# Patient Record
Sex: Female | Born: 1940 | Race: White | Hispanic: No | Smoking: Never smoker
Health system: Southern US, Community
[De-identification: ages and names within clinical notes are randomized; demographics above are authoritative.]

## PROBLEM LIST (undated history)

## (undated) DIAGNOSIS — E876 Hypokalemia: Secondary | ICD-10-CM

## (undated) DIAGNOSIS — F329 Major depressive disorder, single episode, unspecified: Secondary | ICD-10-CM

## (undated) DIAGNOSIS — M797 Fibromyalgia: Secondary | ICD-10-CM

## (undated) DIAGNOSIS — E162 Hypoglycemia, unspecified: Secondary | ICD-10-CM

## (undated) DIAGNOSIS — K5790 Diverticulosis of intestine, part unspecified, without perforation or abscess without bleeding: Secondary | ICD-10-CM

## (undated) DIAGNOSIS — M1990 Unspecified osteoarthritis, unspecified site: Secondary | ICD-10-CM

## (undated) DIAGNOSIS — R112 Nausea with vomiting, unspecified: Secondary | ICD-10-CM

## (undated) DIAGNOSIS — G459 Transient cerebral ischemic attack, unspecified: Secondary | ICD-10-CM

## (undated) DIAGNOSIS — F419 Anxiety disorder, unspecified: Secondary | ICD-10-CM

## (undated) DIAGNOSIS — I639 Cerebral infarction, unspecified: Secondary | ICD-10-CM

## (undated) DIAGNOSIS — F0150 Vascular dementia without behavioral disturbance: Secondary | ICD-10-CM

## (undated) DIAGNOSIS — F32A Depression, unspecified: Secondary | ICD-10-CM

## (undated) DIAGNOSIS — K3184 Gastroparesis: Secondary | ICD-10-CM

## (undated) DIAGNOSIS — IMO0001 Reserved for inherently not codable concepts without codable children: Secondary | ICD-10-CM

## (undated) DIAGNOSIS — G8929 Other chronic pain: Secondary | ICD-10-CM

## (undated) DIAGNOSIS — K219 Gastro-esophageal reflux disease without esophagitis: Secondary | ICD-10-CM

## (undated) DIAGNOSIS — K5900 Constipation, unspecified: Secondary | ICD-10-CM

## (undated) DIAGNOSIS — I10 Essential (primary) hypertension: Secondary | ICD-10-CM

## (undated) DIAGNOSIS — M549 Dorsalgia, unspecified: Secondary | ICD-10-CM

## (undated) DIAGNOSIS — E559 Vitamin D deficiency, unspecified: Secondary | ICD-10-CM

## (undated) DIAGNOSIS — Z9889 Other specified postprocedural states: Secondary | ICD-10-CM

## (undated) HISTORY — PX: CHOLECYSTECTOMY: SHX55

## (undated) HISTORY — PX: ABDOMINAL HYSTERECTOMY: SHX81

## (undated) HISTORY — PX: ROTATOR CUFF REPAIR: SHX139

## (undated) HISTORY — PX: COLON SURGERY: SHX602

## (undated) HISTORY — PX: NECK SURGERY: SHX720

## (undated) HISTORY — PX: BUNIONECTOMY: SHX129

## (undated) HISTORY — PX: ABDOMINOPLASTY: SUR9

## (undated) HISTORY — PX: TONSILLECTOMY: SUR1361

---

## 2003-01-26 HISTORY — PX: BREAST BIOPSY: SHX20

## 2015-09-25 ENCOUNTER — Encounter: Payer: Self-pay | Admitting: Emergency Medicine

## 2015-09-25 ENCOUNTER — Emergency Department
Admission: EM | Admit: 2015-09-25 | Discharge: 2015-09-26 | Payer: Medicare Other | Attending: Emergency Medicine | Admitting: Emergency Medicine

## 2015-09-25 DIAGNOSIS — F332 Major depressive disorder, recurrent severe without psychotic features: Secondary | ICD-10-CM | POA: Diagnosis not present

## 2015-09-25 DIAGNOSIS — F0150 Vascular dementia without behavioral disturbance: Secondary | ICD-10-CM | POA: Insufficient documentation

## 2015-09-25 DIAGNOSIS — I10 Essential (primary) hypertension: Secondary | ICD-10-CM

## 2015-09-25 DIAGNOSIS — Z8673 Personal history of transient ischemic attack (TIA), and cerebral infarction without residual deficits: Secondary | ICD-10-CM | POA: Diagnosis not present

## 2015-09-25 DIAGNOSIS — M797 Fibromyalgia: Secondary | ICD-10-CM

## 2015-09-25 DIAGNOSIS — R45851 Suicidal ideations: Secondary | ICD-10-CM | POA: Diagnosis not present

## 2015-09-25 DIAGNOSIS — E876 Hypokalemia: Secondary | ICD-10-CM | POA: Insufficient documentation

## 2015-09-25 DIAGNOSIS — F329 Major depressive disorder, single episode, unspecified: Secondary | ICD-10-CM | POA: Diagnosis present

## 2015-09-25 DIAGNOSIS — M1990 Unspecified osteoarthritis, unspecified site: Secondary | ICD-10-CM

## 2015-09-25 DIAGNOSIS — Z5181 Encounter for therapeutic drug level monitoring: Secondary | ICD-10-CM | POA: Insufficient documentation

## 2015-09-25 HISTORY — DX: Depression, unspecified: F32.A

## 2015-09-25 HISTORY — DX: Other chronic pain: G89.29

## 2015-09-25 HISTORY — DX: Major depressive disorder, single episode, unspecified: F32.9

## 2015-09-25 HISTORY — DX: Vascular dementia, unspecified severity, without behavioral disturbance, psychotic disturbance, mood disturbance, and anxiety: F01.50

## 2015-09-25 HISTORY — DX: Gastro-esophageal reflux disease without esophagitis: K21.9

## 2015-09-25 HISTORY — DX: Reserved for inherently not codable concepts without codable children: IMO0001

## 2015-09-25 HISTORY — DX: Diverticulosis of intestine, part unspecified, without perforation or abscess without bleeding: K57.90

## 2015-09-25 HISTORY — DX: Transient cerebral ischemic attack, unspecified: G45.9

## 2015-09-25 HISTORY — DX: Essential (primary) hypertension: I10

## 2015-09-25 HISTORY — DX: Hypoglycemia, unspecified: E16.2

## 2015-09-25 HISTORY — DX: Vitamin D deficiency, unspecified: E55.9

## 2015-09-25 HISTORY — DX: Hypokalemia: E87.6

## 2015-09-25 HISTORY — DX: Constipation, unspecified: K59.00

## 2015-09-25 HISTORY — DX: Unspecified osteoarthritis, unspecified site: M19.90

## 2015-09-25 HISTORY — DX: Gastroparesis: K31.84

## 2015-09-25 HISTORY — DX: Anxiety disorder, unspecified: F41.9

## 2015-09-25 HISTORY — DX: Fibromyalgia: M79.7

## 2015-09-25 HISTORY — DX: Dorsalgia, unspecified: M54.9

## 2015-09-25 LAB — URINE DRUG SCREEN, QUALITATIVE (ARMC ONLY)
AMPHETAMINES, UR SCREEN: NOT DETECTED
Barbiturates, Ur Screen: NOT DETECTED
Benzodiazepine, Ur Scrn: NOT DETECTED
CANNABINOID 50 NG, UR ~~LOC~~: NOT DETECTED
COCAINE METABOLITE, UR ~~LOC~~: NOT DETECTED
MDMA (ECSTASY) UR SCREEN: NOT DETECTED
METHADONE SCREEN, URINE: NOT DETECTED
Opiate, Ur Screen: POSITIVE — AB
Phencyclidine (PCP) Ur S: NOT DETECTED
TRICYCLIC, UR SCREEN: NOT DETECTED

## 2015-09-25 LAB — ETHANOL

## 2015-09-25 LAB — BASIC METABOLIC PANEL
ANION GAP: 9 (ref 5–15)
BUN: 27 mg/dL — AB (ref 6–20)
CHLORIDE: 103 mmol/L (ref 101–111)
CO2: 28 mmol/L (ref 22–32)
Calcium: 9 mg/dL (ref 8.9–10.3)
Creatinine, Ser: 1.24 mg/dL — ABNORMAL HIGH (ref 0.44–1.00)
GFR calc Af Amer: 49 mL/min — ABNORMAL LOW (ref >60)
GFR, EST NON AFRICAN AMERICAN: 42 mL/min — AB (ref >60)
Glucose, Bld: 109 mg/dL — ABNORMAL HIGH (ref 65–99)
POTASSIUM: 3.2 mmol/L — AB (ref 3.5–5.1)
SODIUM: 140 mmol/L (ref 135–145)

## 2015-09-25 LAB — CBC
HCT: 40.1 % (ref 35.0–47.0)
HEMOGLOBIN: 13.5 g/dL (ref 12.0–16.0)
MCH: 31.5 pg (ref 26.0–34.0)
MCHC: 33.5 g/dL (ref 32.0–36.0)
MCV: 93.8 fL (ref 80.0–100.0)
Platelets: 175 10*3/uL (ref 150–440)
RBC: 4.27 MIL/uL (ref 3.80–5.20)
RDW: 15.7 % — ABNORMAL HIGH (ref 11.5–14.5)
WBC: 10.2 10*3/uL (ref 3.6–11.0)

## 2015-09-25 LAB — URINALYSIS COMPLETE WITH MICROSCOPIC (ARMC ONLY)
BILIRUBIN URINE: NEGATIVE
Bacteria, UA: NONE SEEN
GLUCOSE, UA: NEGATIVE mg/dL
KETONES UR: NEGATIVE mg/dL
LEUKOCYTES UA: NEGATIVE
NITRITE: NEGATIVE
PH: 5 (ref 5.0–8.0)
Protein, ur: NEGATIVE mg/dL
SPECIFIC GRAVITY, URINE: 1.011 (ref 1.005–1.030)

## 2015-09-25 MED ORDER — POTASSIUM CHLORIDE CRYS ER 20 MEQ PO TBCR
40.0000 meq | EXTENDED_RELEASE_TABLET | Freq: Once | ORAL | Status: AC
  Administered 2015-09-25: 40 meq via ORAL
  Filled 2015-09-25: qty 2

## 2015-09-25 NOTE — ED Triage Notes (Signed)
Pt c/o depression. Has had problems since dx with vascular depression.  New baby in house is coming and pt recently moved and has become overwhelmed. Denies SI/HI. Just would like help before gets to that point.

## 2015-09-25 NOTE — ED Notes (Signed)
Pt does not need protocol or dress out per dr Don Perkingveronese

## 2015-09-25 NOTE — ED Notes (Signed)
Celso AmyLauna Duncan, daughter, call for ride  [phone]

## 2015-09-25 NOTE — ED Provider Notes (Signed)
[facility] Emergency Department Provider Note  ____________________________________________  Time seen: Approximately 8:20 PM  I have reviewed the triage vital signs and the nursing notes.   HISTORY  Chief Complaint Depression    HPI Maria Horton is a 75 y.o. female long history of anxiety and depression presenting with suicidal ideations. The patient reports that she has recently moved to [facility], and her depression has worsened. She says "I don't be here anymore." She also reports suicidal ideations with a plan but does not want coming what it is. She denies any HI, hallucinations, or acute medical complaints.    Past Medical History:  Diagnosis Date  . Anxiety   . Chronic back pain   . Constipation   . Depression   . Diverticulosis   . Fibromyalgia   . Gastroparesis   . Hypertension   . Hypoglycemia   . Hypokalemia   . Osteoarthritis   . Reflux   . TIA (transient ischemic attack)   . Vascular dementia   . Vitamin D deficiency     There are no active problems to display for this patient.   Past Surgical History:  Procedure Laterality Date  . ABDOMINAL HYSTERECTOMY    . CHOLECYSTECTOMY    . COLON SURGERY    . NECK SURGERY    . ROTATOR CUFF REPAIR    . TONSILLECTOMY        Allergies Ace inhibitors; Beta adrenergic blockers; Calcium channel blockers; and Tape  History reviewed. No pertinent family history.  Social History Social History  Substance Use Topics  . Smoking status: Never Smoker  . Smokeless tobacco: Never Used  . Alcohol use No    Review of Systems Constitutional: No fever/chills. Eyes: No visual changes. ENT: No sore throat. No congestion or rhinorrhea. Cardiovascular: Denies chest pain. Denies palpitations. Respiratory: Denies shortness of breath.  No cough. Gastrointestinal: No abdominal pain.  No nausea, no vomiting.  No diarrhea.  No constipation. Genitourinary: Negative for  dysuria. Musculoskeletal: Negative for back pain. Skin: Negative for rash. Neurological: Negative for headaches. No focal numbness, tingling or weakness.  Psychiatric: Positive suicidal ideations. Negative homicidal ideations. Negative hallucinations.  10-point ROS otherwise negative.  ____________________________________________   PHYSICAL EXAM:  VITAL SIGNS: ED Triage Vitals  Enc Vitals Group     BP 09/25/15 1820 (!) 143/77     Pulse Rate 09/25/15 1820 (!) 50     Resp 09/25/15 1820 18     Temp 09/25/15 1820 98.5 F (36.9 C)     Temp Source 09/25/15 1820 Oral     SpO2 09/25/15 1820 98 %     Weight 09/25/15 1821 142 lb (64.4 kg)     Height 09/25/15 1821 5\' 4"  (1.626 m)     Head Circumference --      Peak Flow --      Pain Score 09/25/15 1821 5     Pain Loc --      Pain Edu? --      Excl. in GC? --     Constitutional: Alert and oriented. Well appearing and in no acute distress. Answers questions appropriately. Eyes: Conjunctivae are normal.  EOMI. No scleral icterus. Head: Atraumatic. Nose: No congestion/rhinnorhea. Mouth/Throat: Mucous membranes are moist.  Neck: No stridor.  Supple.  No meningismus Cardiovascular: Normal rate, regular rhythm. No murmurs, rubs or gallops.  Respiratory: Normal respiratory effort.  No accessory muscle use or retractions. Lungs CTAB.  No wheezes, rales or ronchi. Gastrointestinal: Soft, nontender and  nondistended.  No guarding or rebound.  No peritoneal signs. Musculoskeletal: No LE edema.  Neurologic:  A&Ox3.  Speech is clear.  Face and smile are symmetric.  EOMI.  Moves all extremities well. Skin:  Skin is warm, dry and intact. No rash noted. Psychiatric: Normal mood with depressed affect. Good insight. Speech and behavior are normal. Normal judgment.  ____________________________________________   LABS (all labs ordered are listed, but only abnormal results are displayed)  Labs Reviewed  CBC - Abnormal; Notable for the following:        Result Value   RDW 15.7 (*)    All other components within normal limits  BASIC METABOLIC PANEL - Abnormal; Notable for the following:    Potassium 3.2 (*)    Glucose, Bld 109 (*)    BUN 27 (*)    Creatinine, Ser 1.24 (*)    GFR calc non Af Amer 42 (*)    GFR calc Af Amer 49 (*)    All other components within normal limits  URINE DRUG SCREEN, QUALITATIVE ([facility] ONLY) - Abnormal; Notable for the following:    Opiate, Ur Screen POSITIVE (*)    All other components within normal limits  URINALYSIS COMPLETEWITH MICROSCOPIC ([facility] ONLY) - Abnormal; Notable for the following:    Color, Urine STRAW (*)    APPearance CLEAR (*)    Hgb urine dipstick 1+ (*)    Squamous Epithelial / LPF 0-5 (*)    All other components within normal limits  ETHANOL   ____________________________________________  EKG  Not indicated ____________________________________________  RADIOLOGY  No results found.  ____________________________________________   PROCEDURES  Procedure(s) performed: None  Procedures  Critical Care performed: No ____________________________________________   INITIAL IMPRESSION / ASSESSMENT AND PLAN / ED COURSE  Pertinent labs & imaging results that were available during my care of the patient were reviewed by me and considered in my medical decision making (see chart for details).  75 y.o. female with a long history of anxiety and depression presenting for suicidal ideations with plan. She has no active medical concerns at this time. We will place the patient under involuntary commitment, and activate a consult for TTS and psychiatry. We will get basic labs to complete her full medical assessment, after which she will be cleared for psychiatric disposition.  ----------------------------------------- 11:22 PM on 09/25/2015 -----------------------------------------  The patient is mildly hypokalemic and I have supplemented her for this. At this time, the patient is  medically cleared and awaiting psychiatric disposition. ____________________________________________  FINAL CLINICAL IMPRESSION(S) / ED DIAGNOSES  Final diagnoses:  Suicidal ideation  Hypokalemia    Clinical Course      NEW MEDICATIONS STARTED DURING THIS VISIT:  New Prescriptions   No medications on file      Rockne MenghiniAnne-Caroline Mylan Lengyel, MD 09/25/15 2322

## 2015-09-25 NOTE — ED Notes (Signed)
[facility].  Pt consulting with psych now.  sitter with pt.
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
## 2015-09-25 NOTE — ED Notes (Signed)
Pt watching tv  Sitter with pt 

## 2015-09-26 ENCOUNTER — Inpatient Hospital Stay
Admission: RE | Admit: 2015-09-26 | Discharge: 2015-10-01 | DRG: 885 | Disposition: A | Discharge status: Home / Self Care | Payer: Medicare Other | Source: Intra-hospital | Attending: Psychiatry | Admitting: Psychiatry

## 2015-09-26 DIAGNOSIS — G8929 Other chronic pain: Secondary | ICD-10-CM | POA: Diagnosis present

## 2015-09-26 DIAGNOSIS — Z7982 Long term (current) use of aspirin: Secondary | ICD-10-CM

## 2015-09-26 DIAGNOSIS — F332 Major depressive disorder, recurrent severe without psychotic features: Secondary | ICD-10-CM | POA: Diagnosis present

## 2015-09-26 DIAGNOSIS — M1990 Unspecified osteoarthritis, unspecified site: Secondary | ICD-10-CM

## 2015-09-26 DIAGNOSIS — J449 Chronic obstructive pulmonary disease, unspecified: Secondary | ICD-10-CM | POA: Diagnosis present

## 2015-09-26 DIAGNOSIS — Z639 Problem related to primary support group, unspecified: Secondary | ICD-10-CM

## 2015-09-26 DIAGNOSIS — K219 Gastro-esophageal reflux disease without esophagitis: Secondary | ICD-10-CM | POA: Diagnosis present

## 2015-09-26 DIAGNOSIS — M797 Fibromyalgia: Secondary | ICD-10-CM | POA: Diagnosis present

## 2015-09-26 DIAGNOSIS — Z9889 Other specified postprocedural states: Secondary | ICD-10-CM | POA: Diagnosis not present

## 2015-09-26 DIAGNOSIS — I10 Essential (primary) hypertension: Secondary | ICD-10-CM | POA: Diagnosis present

## 2015-09-26 DIAGNOSIS — Z90710 Acquired absence of both cervix and uterus: Secondary | ICD-10-CM | POA: Diagnosis not present

## 2015-09-26 DIAGNOSIS — F0150 Vascular dementia without behavioral disturbance: Secondary | ICD-10-CM | POA: Diagnosis present

## 2015-09-26 DIAGNOSIS — Z888 Allergy status to other drugs, medicaments and biological substances status: Secondary | ICD-10-CM

## 2015-09-26 DIAGNOSIS — Z9049 Acquired absence of other specified parts of digestive tract: Secondary | ICD-10-CM

## 2015-09-26 DIAGNOSIS — Z79899 Other long term (current) drug therapy: Secondary | ICD-10-CM

## 2015-09-26 DIAGNOSIS — M549 Dorsalgia, unspecified: Secondary | ICD-10-CM | POA: Diagnosis present

## 2015-09-26 DIAGNOSIS — Z8673 Personal history of transient ischemic attack (TIA), and cerebral infarction without residual deficits: Secondary | ICD-10-CM | POA: Diagnosis not present

## 2015-09-26 DIAGNOSIS — Z7951 Long term (current) use of inhaled steroids: Secondary | ICD-10-CM | POA: Diagnosis not present

## 2015-09-26 DIAGNOSIS — G894 Chronic pain syndrome: Secondary | ICD-10-CM | POA: Diagnosis present

## 2015-09-26 DIAGNOSIS — G4700 Insomnia, unspecified: Secondary | ICD-10-CM | POA: Diagnosis present

## 2015-09-26 DIAGNOSIS — R45851 Suicidal ideations: Secondary | ICD-10-CM

## 2015-09-26 DIAGNOSIS — Z5181 Encounter for therapeutic drug level monitoring: Secondary | ICD-10-CM | POA: Diagnosis not present

## 2015-09-26 DIAGNOSIS — R001 Bradycardia, unspecified: Secondary | ICD-10-CM | POA: Diagnosis not present

## 2015-09-26 MED ORDER — CLONAZEPAM 0.5 MG PO TABS
ORAL_TABLET | ORAL | Status: AC
  Administered 2015-09-26: 1 mg
  Filled 2015-09-26: qty 2

## 2015-09-26 MED ORDER — ACETAMINOPHEN 325 MG PO TABS: 650.0000 mg | ORAL_TABLET | Freq: Four times a day (QID) | ORAL | Status: DC | PRN

## 2015-09-26 MED ORDER — TRAZODONE HCL 100 MG PO TABS
ORAL_TABLET | ORAL | Status: AC
  Filled 2015-09-26: qty 1

## 2015-09-26 MED ORDER — CITALOPRAM HYDROBROMIDE 20 MG PO TABS
20.0000 mg | ORAL_TABLET | Freq: Every day | ORAL | Status: DC
  Administered 2015-09-26: 20 mg via ORAL
  Filled 2015-09-26: qty 1

## 2015-09-26 MED ORDER — CLONAZEPAM 0.5 MG PO TABS: 1.0000 mg | ORAL_TABLET | Freq: Every day | ORAL | Status: DC

## 2015-09-26 MED ORDER — CLONAZEPAM 1 MG PO TABS
1.0000 mg | ORAL_TABLET | Freq: Every day | ORAL | Status: DC
  Administered 2015-09-26: 1 mg via ORAL
  Filled 2015-09-26: qty 1

## 2015-09-26 MED ORDER — TRAZODONE HCL 50 MG PO TABS
150.0000 mg | ORAL_TABLET | Freq: Every day | ORAL | Status: DC
  Administered 2015-09-26: 150 mg via ORAL

## 2015-09-26 MED ORDER — TRAZODONE HCL 50 MG PO TABS
ORAL_TABLET | ORAL | Status: AC
  Filled 2015-09-26: qty 1

## 2015-09-26 MED ORDER — MAGNESIUM HYDROXIDE 400 MG/5ML PO SUSP
30.0000 mL | Freq: Every day | ORAL | Status: DC | PRN
  Administered 2015-09-30: 30 mL via ORAL
  Filled 2015-09-26: qty 30

## 2015-09-26 MED ORDER — HYDROCODONE-ACETAMINOPHEN 5-325 MG PO TABS
1.0000 | ORAL_TABLET | Freq: Four times a day (QID) | ORAL | Status: DC | PRN
  Administered 2015-09-27 – 2015-09-30 (×4): 1 via ORAL
  Filled 2015-09-26 (×4): qty 1

## 2015-09-26 MED ORDER — TRAZODONE HCL 50 MG PO TABS
150.0000 mg | ORAL_TABLET | Freq: Every day | ORAL | Status: DC
  Administered 2015-09-26 – 2015-09-30 (×5): 150 mg via ORAL
  Filled 2015-09-26 (×5): qty 1

## 2015-09-26 MED ORDER — ONDANSETRON HCL 4 MG PO TABS: 4.0000 mg | ORAL_TABLET | Freq: Three times a day (TID) | ORAL | Status: DC | PRN

## 2015-09-26 MED ORDER — ALBUTEROL SULFATE (2.5 MG/3ML) 0.083% IN NEBU: 3.0000 mL | INHALATION_SOLUTION | Freq: Four times a day (QID) | RESPIRATORY_TRACT | Status: DC | PRN

## 2015-09-26 MED ORDER — ALUM & MAG HYDROXIDE-SIMETH 200-200-20 MG/5ML PO SUSP: 30.0000 mL | ORAL | Status: DC | PRN

## 2015-09-26 MED ORDER — CITALOPRAM HYDROBROMIDE 20 MG PO TABS: 40.0000 mg | ORAL_TABLET | Freq: Every day | ORAL | Status: DC

## 2015-09-26 MED ORDER — ASPIRIN EC 81 MG PO TBEC
81.0000 mg | DELAYED_RELEASE_TABLET | Freq: Every day | ORAL | Status: DC
  Administered 2015-09-27 – 2015-10-01 (×5): 81 mg via ORAL
  Filled 2015-09-26 (×5): qty 1

## 2015-09-26 MED ORDER — ASPIRIN EC 81 MG PO TBEC
81.0000 mg | DELAYED_RELEASE_TABLET | Freq: Every day | ORAL | Status: DC
  Administered 2015-09-26: 81 mg via ORAL
  Filled 2015-09-26: qty 1

## 2015-09-26 MED ORDER — PANTOPRAZOLE SODIUM 40 MG PO TBEC
40.0000 mg | DELAYED_RELEASE_TABLET | Freq: Every day | ORAL | Status: DC
  Administered 2015-09-27 – 2015-10-01 (×5): 40 mg via ORAL
  Filled 2015-09-26 (×5): qty 1

## 2015-09-26 MED ORDER — ALBUTEROL SULFATE HFA 108 (90 BASE) MCG/ACT IN AERS
1.0000 | INHALATION_SPRAY | Freq: Four times a day (QID) | RESPIRATORY_TRACT | Status: DC | PRN
  Filled 2015-09-26: qty 8

## 2015-09-26 MED ORDER — FLUTICASONE PROPIONATE 50 MCG/ACT NA SUSP
2.0000 | Freq: Every day | NASAL | Status: DC | PRN
  Filled 2015-09-26: qty 16

## 2015-09-26 MED ORDER — TRAZODONE HCL 50 MG PO TABS: 150.0000 mg | ORAL_TABLET | Freq: Every day | ORAL | Status: DC

## 2015-09-26 MED ORDER — CITALOPRAM HYDROBROMIDE 20 MG PO TABS
40.0000 mg | ORAL_TABLET | Freq: Every day | ORAL | Status: DC
  Administered 2015-09-27 – 2015-10-01 (×5): 40 mg via ORAL
  Filled 2015-09-26 (×5): qty 2

## 2015-09-26 MED ORDER — HYDROCODONE-ACETAMINOPHEN 5-325 MG PO TABS
1.0000 | ORAL_TABLET | Freq: Four times a day (QID) | ORAL | Status: DC | PRN
  Administered 2015-09-26: 1 via ORAL
  Filled 2015-09-26: qty 1

## 2015-09-26 MED ORDER — PANTOPRAZOLE SODIUM 40 MG PO TBEC
40.0000 mg | DELAYED_RELEASE_TABLET | Freq: Every day | ORAL | Status: DC
  Administered 2015-09-26: 40 mg via ORAL
  Filled 2015-09-26: qty 1

## 2015-09-26 NOTE — ED Notes (Signed)
BEHAVIORAL HEALTH ROUNDING Patient sleeping: Yes.   Patient alert and oriented: eyes closed  Appears to be asleep Behavior appropriate: Yes.  ; If no, describe:  Nutrition and fluids offered: Yes  Toileting and hygiene offered: sleeping Sitter present: q 15 minute observations and security monitoring Law enforcement present: yes  ODS 

## 2015-09-26 NOTE — ED Notes (Signed)

## 2015-09-26 NOTE — ED Notes (Signed)
[facility] after change of shift

## 2015-09-26 NOTE — Tx Team (Signed)
Initial Treatment Plan 09/26/2015 11:57 PM Maria Horton [id number]    PATIENT STRESSORS: Marital or family conflict Medication change or noncompliance   PATIENT STRENGTHS: Ability for insight Capable of independent living Communication skills Motivation for treatment/growth   PATIENT IDENTIFIED PROBLEMS:   Depression  SI      "Medications to alleviate depression"           DISCHARGE CRITERIA:  Improved stabilization in mood, thinking, and/or behavior  PRELIMINARY DISCHARGE PLAN: Outpatient therapy  PATIENT/FAMILY INVOLVEMENT: This treatment plan has been presented to and reviewed with the patient, Maria Horton, and/or family member, .  The patient and family have been given the opportunity to ask questions and make suggestions.  Ernesto RutherfordKristen Saraann Enneking, RN 09/26/2015, 11:57 PM

## 2015-09-26 NOTE — ED Notes (Signed)
BEHAVIORAL HEALTH ROUNDING Patient sleeping: No. Patient alert and oriented: yes Behavior appropriate: Yes.  ; If no, describe:  Nutrition and fluids offered: yes Toileting and hygiene offered: Yes  Sitter present: q15 minute observations and security  monitoring Law enforcement present: Yes  ODS  

## 2015-09-26 NOTE — ED Notes (Signed)
meds given.  Pt argumenative with rn.  Pt wanting her trazadone as well as klonopin.   md aware.  Sitter with pt.

## 2015-09-26 NOTE — ED Notes (Signed)
Lunch was given to patient 

## 2015-09-26 NOTE — ED Notes (Signed)

## 2015-09-26 NOTE — ED Notes (Signed)
Patient observed lying in bed with eyes closed  Even, unlabored respirations observed   NAD pt appears to be sleeping  I will continue to monitor along with every 15 minute visual observations and ongoing security monitoring   Environment secured

## 2015-09-26 NOTE — Progress Notes (Signed)
[facility] by Dr. Toni Amendlapacs.  Attending Physician will be Dr. Jennet MaduroPucilowska.   Patient has been assigned to room 301, [facility] Charge Nurse Marylu LundJanet.   Intake Paper Work has been signed and placed on patient chart.  ER staff is aware of the admission [facility]( [facility].; Dr. ER MD; Dorinda Hillonald Patient's Nurse & Denies Patient Access).    Please call Report after 7pm    09/26/2015 Cheryl FlashNicole Rucha Wissinger, MS, NCC, [facility] Therapeutic Triage Specialist

## 2015-09-26 NOTE — ED Notes (Signed)
Patient refused breakfast at this time, but left in room at patient's request.

## 2015-09-26 NOTE — ED Notes (Signed)
BEHAVIORAL HEALTH ROUNDING Patient sleeping: No. Patient alert and oriented: yes Behavior appropriate: Yes.  ; If no, describe:  Nutrition and fluids offered: yes Toileting and hygiene offered: Yes  Sitter present: q15 minute observations and security monitoring Law enforcement present: Yes  ODS  Pt observed with no unusual behavior  Appropriate to stimulation  No verbalized needs or concerns at this time  NAD assessed  Continue to monitor  

## 2015-09-26 NOTE — ED Notes (Signed)
ED BHU PLACEMENT JUSTIFICATION Is the patient under IVC or is there intent for IVC: Yes.   Is the patient medically cleared: Yes.   Is there vacancy in the ED BHU: Yes.   Is the population mix appropriate for patient: Yes.   Is the patient awaiting placement in inpatient or outpatient setting:  Consult pending Has the patient had a psychiatric consult:   Consult pending  Survey of unit performed for contraband, proper placement and condition of furniture, tampering with fixtures in bathroom, shower, and each patient room: Yes.  ; Findings:  APPEARANCE/BEHAVIOR Calm and cooperative NEURO ASSESSMENT Orientation: oriented x3  Denies pain Hallucinations: No.None noted (Hallucinations) Speech: Normal Gait: normal RESPIRATORY ASSESSMENT Even  Unlabored respirations  CARDIOVASCULAR ASSESSMENT Pulses equal   regular rate  Skin warm and dry   GASTROINTESTINAL ASSESSMENT no GI complaint EXTREMITIES Full ROM  PLAN OF CARE Provide calm/safe environment. Vital signs assessed twice daily. ED BHU Assessment once each 12-hour shift. Collaborate with intake TTS daily or as condition indicates. Assure the ED provider has rounded once each shift. Provide and encourage hygiene. Provide redirection as needed. Assess for escalating behavior; address immediately and inform ED provider.  Assess family dynamic and appropriateness for visitation as needed: Yes.  ; If necessary, describe findings:  Educate the patient/family about BHU procedures/visitation: Yes.  ; If necessary, describe findings:

## 2015-09-26 NOTE — ED Notes (Signed)
BEHAVIORAL HEALTH ROUNDING Patient sleeping: Yes.   Patient alert and oriented: eyes closed  Appears asleep Behavior appropriate: Yes.  ; If no, describe:  Nutrition and fluids offered: Yes  Toileting and hygiene offered: sleeping Sitter present: q 15 minute observations and security monitoring Law enforcement present: yes  ODS 

## 2015-09-26 NOTE — ED Notes (Signed)
Patient refused shower. 

## 2015-09-26 NOTE — ED Notes (Signed)
She has spoke with her daughter Arnoldo MoraleLauna  Pt alert and ambulating in her room  NAD assessed

## 2015-09-26 NOTE — ED Notes (Signed)
BEHAVIORAL HEALTH ROUNDING Patient sleeping: Yes.   Patient alert and oriented: eyes closed  Appears to be asleep Behavior appropriate: Yes.  ; If no, describe:  Nutrition and fluids offered: Yes  Toileting and hygiene offered:  Yes  Sitter present: q 15 minute observations and security monitoring Law enforcement present: yes  ODS

## 2015-09-26 NOTE — ED Notes (Signed)
Pt states depressed. Wanting to sleep trazodone she takes at 10pm didn't get til 0131. Pt states restless. Pt denies any pain. Pt  Denies SI and HI. Pt ambulates with steady gait. Pt reports uses a crane at times. No distress noted. Pt has 1:1 sitter at the bedside. Pt is continent walks to bathroom.

## 2015-09-26 NOTE — ED Notes (Signed)

## 2015-09-26 NOTE — ED Notes (Signed)
Breakfast provided at bedside  Pt continues to sleep quietly  NAD assessed

## 2015-09-26 NOTE — ED Notes (Signed)
Am meds administered as ordered  Pt reports  "I have slept all morning because they did not give me my bedtime medicine until 0300 this am.  I hurt in my joints - especially my shoulders and back."  Assessment completed

## 2015-09-26 NOTE — ED Notes (Signed)
Pt watching tv.  Sitter with pt.  Pt calm and cooperative.

## 2015-09-26 NOTE — Consult Note (Signed)
[facility] Consult   Reason for Consult:  Consult for 75 year old woman who presented to the emergency room with complaints of severe depression Referring Physician:  Quentin Cornwall Patient Identification: Maria Horton MRN:  [id number] Principal Diagnosis: Severe recurrent major depression without psychotic features [facility]) Diagnosis:   Patient Active Problem List   Diagnosis Date Noted  . Severe recurrent major depression without psychotic features ([place]) [F33.2] 09/26/2015  . Suicidal ideation [R45.851] 09/26/2015  . Fibromyalgia [M79.7] 09/26/2015  . Osteoarthritis [M19.90] 09/26/2015  . Hypertension [I10] 09/26/2015    Total Time spent with patient: 1 hour  Subjective:   Maria Horton is a 75 y.o. female patient admitted with "I've just been feeling terrible".  HPI:  Patient interviewed. Chart reviewed. Labs and vitals reviewed. Case discussed with TTS and emergency room physician. 75 year old woman brought to the emergency room by her daughter because of depression. Patient states that her mood has been depressed for years but has been getting progressively worse over the last year that she has been living here in [place]. She feels sad almost all the time. The last 2 days she has been crying almost constantly. Feels hopeless about her life situation. Admits that she has had suicidal thoughts. Was having thoughts about shooting herself until her daughter took her gun away from her. Has had thoughts about wanting to ensure only. Patient says that she is compliant with her prescribed antidepressant medicine. Her sleep is adequate only in the context of taking her sleeping medicine. Appetite is poor and she has lost about 20 pounds. Patient feels lonely and isolated. She does not report any auditory or visual hallucinations. She is not drinking alcohol or abusing any drugs. She is currently getting her medicine prescribed by a primary care doctor and is not seeing a mental  health professional. Major stress includes multiple issues revolving around her to adult children. Also she feels she has never adjusted to relocating to [place] from her home in [place] a year ago.  Social history: Patient moved here from [place] a little over a year ago. She was living with her adult daughter. Recently moved out into a home of her own. Feels lonely and isolated. Feels like her daughter and son and their families are not very considerate of her.  Medical history: Multiple medical problems including hypertension, gastric reflux disease, history of fibromyalgia and osteoarthritis  Substance abuse history: Denies any use of alcohol or drugs currently or any past use of substances to abuse.  Past Psychiatric History: Patient has a long history of depression. Has had 2 prior psychiatric hospitalizations both in [place] a few years ago. She has had recurrent suicidal ideation but has not actually attempted to kill her self. Patient is currently taking citalopram 20 mg a day and trazodone 100 mg at night. She thinks she has been on other antidepressants in the past but cannot remember what they were. She thinks that medicines had been effective at one point. Denies any history of mania and denies any history of psychotic symptoms. She was seeing a therapist in [place] for a while but currently is not seeing any mental health provider regularly.  Risk to Self: Suicidal Ideation: Yes-Currently Present (sts "I don't want to be here anymore.") Suicidal Intent: Yes-Currently Present Is patient at risk for suicide?: Yes Suicidal Plan?: Yes-Currently Present Specify Current Suicidal Plan:  (sts plan to wreck her car or shoot herself ) Access to Means: No (pt sts that daughter has her gun  for safety) What has been your use of drugs/alcohol within the last 12 months?:  (none) How many times?:  (0) Other Self Harm Risks:  (none noted) Triggers for Past Attempts: None known Intentional Self  Injurious Behavior: None Risk to Others: Homicidal Ideation: No Thoughts of Harm to Others: No (pt sts she has anger outbursts with verbal aggression) Current Homicidal Intent: No Current Homicidal Plan: No Access to Homicidal Means: No Identified Victim:  (none noted) History of harm to others?: No (denies) Assessment of Violence: None Noted Violent Behavior Description:  (verbal aggression only when angry or stressed) Does patient have access to weapons?: No Criminal Charges Pending?: No Does patient have a court date: No Prior Inpatient Therapy: Prior Inpatient Therapy: Yes Prior Therapy Dates:  (2014; 2007) Prior Therapy Facilty/Provider(s):  (facilities in [place]) Reason for Treatment:  (Depression, SI) Prior Outpatient Therapy: Prior Outpatient Therapy: Yes Prior Therapy Dates:  ("many years") Prior Therapy Facilty/Provider(s):  (various, mostly in [place]) Reason for Treatment:  (Depression, Anxiety) Does patient have an ACCT team?: No Does patient have Intensive In-House Services?  : No Does patient have [facility] services? : No Does patient have P4CC services?: Unknown  Past Medical History:  Past Medical History:  Diagnosis Date  . Anxiety   . Chronic back pain   . Constipation   . Depression   . Diverticulosis   . Fibromyalgia   . Gastroparesis   . Hypertension   . Hypoglycemia   . Hypokalemia   . Osteoarthritis   . Reflux   . TIA (transient ischemic attack)   . Vascular dementia   . Vitamin D deficiency     Past Surgical History:  Procedure Laterality Date  . ABDOMINAL HYSTERECTOMY    . CHOLECYSTECTOMY    . COLON SURGERY    . NECK SURGERY    . ROTATOR CUFF REPAIR    . TONSILLECTOMY     Family History: History reviewed. No pertinent family history. Family Psychiatric  History: Patient denies being aware of any family history of mental illness Social History:  History  Alcohol Use No     History  Drug Use No    Social History   Social History  .  Marital status: Widowed    Spouse name: N/A  . Number of children: N/A  . Years of education: N/A   Social History Main Topics  . Smoking status: Never Smoker  . Smokeless tobacco: Never Used  . Alcohol use No  . Drug use: No  . Sexual activity: Not Asked   Other Topics Concern  . None   Social History Narrative  . None   Additional Social History:    Allergies:   Allergies  Allergen Reactions  . Ace Inhibitors Other (See Comments)    achy  . Beta Adrenergic Blockers Other (See Comments)    Makes achy/feel sick  . Calcium Channel Blockers Other (See Comments)    Feels achy like sick  . Tape Rash    Labs:  Results for orders placed or performed during the hospital encounter of 09/25/15 (from the past 48 hour(s))  CBC     Status: Abnormal   Collection Time: 09/25/15  8:29 PM  Result Value Ref Range   WBC 10.2 3.6 - 11.0 K/uL   RBC 4.27 3.80 - 5.20 MIL/uL   Hemoglobin 13.5 12.0 - 16.0 g/dL   HCT 40.1 35.0 - 47.0 %   MCV 93.8 80.0 - 100.0 fL   MCH 31.5 26.0 - 34.0  pg   MCHC 33.5 32.0 - 36.0 g/dL   RDW 15.7 (H) 11.5 - 14.5 %   Platelets 175 150 - 440 K/uL  Basic metabolic panel     Status: Abnormal   Collection Time: 09/25/15  8:29 PM  Result Value Ref Range   Sodium 140 135 - 145 mmol/L   Potassium 3.2 (L) 3.5 - 5.1 mmol/L   Chloride 103 101 - 111 mmol/L   CO2 28 22 - 32 mmol/L   Glucose, Bld 109 (H) 65 - 99 mg/dL   BUN 27 (H) 6 - 20 mg/dL   Creatinine, Ser 1.24 (H) 0.44 - 1.00 mg/dL   Calcium 9.0 8.9 - 10.3 mg/dL   GFR calc non Af Amer 42 (L) >60 mL/min   GFR calc Af Amer 49 (L) >60 mL/min    Comment: (NOTE) The eGFR has been calculated using the CKD EPI equation. This calculation has not been validated in all clinical situations. eGFR's persistently <60 mL/min signify possible Chronic Kidney Disease.    Anion gap 9 5 - 15  Ethanol     Status: None   Collection Time: 09/25/15  8:29 PM  Result Value Ref Range   Alcohol, Ethyl (B) <5 <5 mg/dL     Comment:        LOWEST DETECTABLE LIMIT FOR SERUM ALCOHOL IS 5 mg/dL FOR MEDICAL PURPOSES ONLY   Urine Drug Screen, Qualitative ([facility] only)     Status: Abnormal   Collection Time: 09/25/15  8:30 PM  Result Value Ref Range   Tricyclic, Ur Screen NONE DETECTED NONE DETECTED   Amphetamines, Ur Screen NONE DETECTED NONE DETECTED   MDMA (Ecstasy)Ur Screen NONE DETECTED NONE DETECTED   Cocaine Metabolite,Ur [place] NONE DETECTED NONE DETECTED   Opiate, Ur Screen POSITIVE (A) NONE DETECTED   Phencyclidine (PCP) Ur S NONE DETECTED NONE DETECTED   Cannabinoid 50 Ng, Ur [place] NONE DETECTED NONE DETECTED   Barbiturates, Ur Screen NONE DETECTED NONE DETECTED   Benzodiazepine, Ur Scrn NONE DETECTED NONE DETECTED   Methadone Scn, Ur NONE DETECTED NONE DETECTED    Comment: (NOTE) 604  Tricyclics, urine               Cutoff 1000 ng/mL 200  Amphetamines, urine             Cutoff 1000 ng/mL 300  MDMA (Ecstasy), urine           Cutoff 500 ng/mL 400  Cocaine Metabolite, urine       Cutoff 300 ng/mL 500  Opiate, urine                   Cutoff 300 ng/mL 600  Phencyclidine (PCP), urine      Cutoff 25 ng/mL 700  Cannabinoid, urine              Cutoff 50 ng/mL 800  Barbiturates, urine             Cutoff 200 ng/mL 900  Benzodiazepine, urine           Cutoff 200 ng/mL 1000 Methadone, urine                Cutoff 300 ng/mL 1100 1200 The urine drug screen provides only a preliminary, unconfirmed 1300 analytical test result and should not be used for non-medical 1400 purposes. Clinical consideration and professional judgment should 1500 be applied to any positive drug screen result due to possible 1600 interfering substances. A more specific alternate chemical method  1700 must be used in order to obtain a confirmed analytical result.  1800 Gas chromato graphy / mass spectrometry (GC/MS) is the preferred 1900 confirmatory method.   Urinalysis complete, with microscopic ([facility] only)     Status: Abnormal   Collection  Time: 09/25/15  8:30 PM  Result Value Ref Range   Color, Urine STRAW (A) YELLOW   APPearance CLEAR (A) CLEAR   Glucose, UA NEGATIVE NEGATIVE mg/dL   Bilirubin Urine NEGATIVE NEGATIVE   Ketones, ur NEGATIVE NEGATIVE mg/dL   Specific Gravity, Urine 1.011 1.005 - 1.030   Hgb urine dipstick 1+ (A) NEGATIVE   pH 5.0 5.0 - 8.0   Protein, ur NEGATIVE NEGATIVE mg/dL   Nitrite NEGATIVE NEGATIVE   Leukocytes, UA NEGATIVE NEGATIVE   RBC / HPF 0-5 0 - 5 RBC/hpf   WBC, UA 0-5 0 - 5 WBC/hpf   Bacteria, UA NONE SEEN NONE SEEN   Squamous Epithelial / LPF 0-5 (A) NONE SEEN   Mucous PRESENT    Hyaline Casts, UA PRESENT     Current Facility-Administered Medications  Medication Dose Route Frequency Provider Last Rate Last Dose  . albuterol (PROVENTIL) (2.5 MG/3ML) 0.083% nebulizer solution 3 mL  3 mL Inhalation Q6H PRN Gregor Hams, MD      . aspirin EC tablet 81 mg  81 mg Oral Daily Gregor Hams, MD   81 mg at 09/26/15 1145  . [START ON 09/27/2015] citalopram (CELEXA) tablet 40 mg  40 mg Oral Daily Gonzella Lex, MD      . clonazePAM (KLONOPIN) tablet 1 mg  1 mg Oral QHS Gregor Hams, MD      . fluticasone [facility]) 50 MCG/ACT nasal spray 2 spray  2 spray Each Nare Daily PRN Gregor Hams, MD      . HYDROcodone-acetaminophen (NORCO/VICODIN) 5-325 MG per tablet 1 tablet  1 tablet Oral Q6H PRN Gregor Hams, MD   1 tablet at 09/26/15 1151  . ondansetron (ZOFRAN) tablet 4 mg  4 mg Oral Q8H PRN Gregor Hams, MD      . pantoprazole (PROTONIX) EC tablet 40 mg  40 mg Oral Daily Gregor Hams, MD   40 mg at 09/26/15 1145  . traZODone (DESYREL) tablet 150 mg  150 mg Oral QHS Gregor Hams, MD   150 mg at 09/26/15 0131  . traZODone (DESYREL) tablet 150 mg  150 mg Oral QHS Gregor Hams, MD       Current Outpatient Prescriptions  Medication Sig Dispense Refill  . albuterol (PROVENTIL HFA;VENTOLIN HFA) 108 (90 Base) MCG/ACT inhaler Inhale 2 puffs into the lungs every 6 (six) hours  as needed for wheezing or shortness of breath.    Marland Kitchen aspirin EC 81 MG tablet Take 81 mg by mouth daily.    . citalopram (CELEXA) 20 MG tablet Take 20 mg by mouth daily.    . clonazePAM (KLONOPIN) 1 MG tablet Take 1 mg by mouth at bedtime.    Marland Kitchen esomeprazole (NEXIUM) 40 MG capsule Take 40 mg by mouth daily at 12 noon.    . fluticasone (FLONASE) 50 MCG/ACT nasal spray Place 2 sprays into both nostrils daily as needed for allergies or rhinitis.    Marland Kitchen HYDROcodone-acetaminophen (NORCO/VICODIN) 5-325 MG tablet Take 1 tablet by mouth every 6 (six) hours as needed for moderate pain.    Marland Kitchen ondansetron (ZOFRAN) 4 MG tablet Take 4 mg by mouth every 8 (eight) hours as needed for nausea or vomiting.    Marland Kitchen  traZODone (DESYREL) 150 MG tablet Take by mouth at bedtime.      Musculoskeletal: Strength & Muscle Tone: within normal limits Gait & Station: normal Patient leans: N/A  Psychiatric Specialty Exam: Physical Exam  Nursing note and vitals reviewed. Constitutional: She appears well-developed and well-nourished.  HENT:  Head: Normocephalic and atraumatic.  Eyes: Conjunctivae are normal. Pupils are equal, round, and reactive to light.  Neck: Normal range of motion.  Cardiovascular: Regular rhythm and normal heart sounds.   Respiratory: Effort normal. No respiratory distress.  GI: Soft.  Musculoskeletal: Normal range of motion.  Neurological: She is alert.  Skin: Skin is warm and dry.  Psychiatric: Judgment normal. Her mood appears anxious. Her affect is blunt. Her speech is delayed. She is slowed. Cognition and memory are normal. She exhibits a depressed mood. She expresses suicidal ideation. She expresses suicidal plans.    Review of Systems  Constitutional: Negative.   HENT: Negative.   Eyes: Negative.   Respiratory: Negative.   Cardiovascular: Negative.   Gastrointestinal: Negative.   Musculoskeletal: Positive for back pain, joint pain and neck pain.  Skin: Negative.   Neurological: Negative.    Psychiatric/Behavioral: Positive for depression and suicidal ideas. Negative for hallucinations, memory loss and substance abuse. The patient is nervous/anxious and has insomnia.     Blood pressure (!) 105/55, pulse 62, temperature 98 F (36.7 C), temperature source Oral, resp. rate 16, height '5\' 4"'  (1.626 m), weight 64.4 kg (142 lb), SpO2 97 %.Body mass index is 24.37 kg/m.  General Appearance: Casual  Eye Contact:  Fair  Speech:  Slow  Volume:  Decreased  Mood:  Depressed  Affect:  Depressed  Thought Process:  Goal Directed  Orientation:  Full (Time, Place, and Person)  Thought Content:  Logical  Suicidal Thoughts:  Yes.  with intent/plan  Homicidal Thoughts:  No  Memory:  Immediate;   Good Recent;   Fair Remote;   Fair  Judgement:  Fair  Insight:  Fair  Psychomotor Activity:  Decreased  Concentration:  Concentration: Fair  Recall:  [employer] of Knowledge:  Fair  Language:  Fair  Akathisia:  No  Handed:  Right  AIMS (if indicated):     Assets:  Communication Skills Desire for Improvement Financial Resources/Insurance Housing Leisure Time Resilience  ADL's:  Intact  Cognition:  WNL  Sleep:        Treatment Plan Summary: Daily contact with patient to assess and evaluate symptoms and progress in treatment, Medication management and Plan This is a 75 year old woman with a history of recurrent severe depression who is currently presenting with multiple symptoms of depression including active suicidal ideation. No evidence of obvious psychotic symptoms. Patient is cooperative and calm. She has chronic pain issues and high blood pressure. Labs unremarkable. No substance abuse. Patient requires inpatient hospital level treatment for depression. Patient reports that she is normally completely ambulatory around the house. She is alert and oriented and able to remember 2 out of 3 objects at 3 minutes and engage in a lucid and meaningful conversation including insight into her own  problems. Patient to my assessment would be appropriate for management on the adult psychiatric unit here. I am going to go ahead and put in orders. I've increase the dose of her citalopram to 40 mg a day. Case reviewed with TTS.  Disposition: Recommend psychiatric Inpatient admission when medically cleared. Supportive therapy provided about ongoing stressors.  Alethia Berthold, MD 09/26/2015 4:30 PM

## 2015-09-27 LAB — LIPID PANEL
CHOLESTEROL: 275 mg/dL — AB (ref 0–200)
HDL: 48 mg/dL (ref >40)
LDL Cholesterol: 176 mg/dL — ABNORMAL HIGH (ref 0–99)
TRIGLYCERIDES: 255 mg/dL — AB (ref <150)
Total CHOL/HDL Ratio: 5.7 RATIO
VLDL: 51 mg/dL — ABNORMAL HIGH (ref 0–40)

## 2015-09-27 LAB — TSH: TSH: 1.428 u[IU]/mL (ref 0.350–4.500)

## 2015-09-27 LAB — HEMOGLOBIN A1C: HEMOGLOBIN A1C: 5.6 % (ref 4.0–6.0)

## 2015-09-27 MED ORDER — CLONAZEPAM 0.5 MG PO TABS
0.7500 mg | ORAL_TABLET | Freq: Every day | ORAL | Status: DC
  Administered 2015-09-27 – 2015-09-30 (×4): 0.75 mg via ORAL
  Filled 2015-09-27 (×4): qty 2

## 2015-09-27 MED ORDER — HYDROCHLOROTHIAZIDE 12.5 MG PO CAPS
12.5000 mg | ORAL_CAPSULE | Freq: Every day | ORAL | Status: DC
  Administered 2015-09-28 – 2015-10-01 (×4): 12.5 mg via ORAL
  Filled 2015-09-27 (×5): qty 1

## 2015-09-27 NOTE — H&P (Addendum)
Psychiatric Admission Assessment Adult  Patient Identification: Maria Horton MRN:  [id number] Date of Evaluation:  09/27/2015 Chief Complaint:  Major Depression Disorder  Principal Diagnosis: Major Depression Diagnosis:   Patient Active Problem List   Diagnosis Date Noted  . Severe recurrent major depression without psychotic features (HCC) [F33.2] 09/26/2015  . Suicidal ideation [R45.851] 09/26/2015  . Fibromyalgia [M79.7] 09/26/2015  . Osteoarthritis [M19.90] 09/26/2015  . Hypertension [I10] 09/26/2015   History of Present Illness:  Ms. Maria Horton is a 75 year old widowed Caucasian female with a history of recurrent major depression was brought to the emergency room by her daughter who lives in [place]. The patient says her depressive symptoms have progressively gotten worse over the past one year. She moved to [place] from [place] approximately 1 year ago and has lived in [place] for several years. She says she does not have a lot of friends here and is very lonely. She initially moved to be closer to her daughter but she and her daughter have been having some conflict. She also reports conflict with her son. Her son lives in [place] and his girlfriend is now living with the patient as she is expecting a baby in 2 weeks. The patient feels like her son has abandoned the girlfriend and the child. She has a very good relationship with her son's girlfriend who lives with her but feels that her biological daughter is jealous of her relationship with his son's girlfriend. She says her daughter is also jealous of her son. The patient had suicidal thoughts with a plan to shoot herself. She does not have a gun in the house and the gun was left at her daughter's house in [place] over 1 year ago. She does report anhedonia, feelings of hopelessness, low energy and frequent crying spells. She has had problems with insomnia but takes trazodone and Klonopin to help with sleep. She has had a 20 pound weight loss  and decreased appetite over the past one year. She denies any symptoms consistent with bipolar mania including grandiose delusions, decreased sleep with increased goal-directed behavior, hyperreligious thoughts, hypersexual behavior. She denies any history of any psychotic symptoms including auditory or visual hallucinations. No paranoid thoughts or delusions. She denies any history of any heavy alcohol use or illicit drug use. The patient was seeing a psychiatrist and a therapist at [facility] in [place]. She likes her therapist there but does not have a good rapport with her psychiatrist.   Past psychiatric history: The patient says she first started taking antidepressants in her 26s and has tried multiple antidepressants in the past but cannot remember their name. She did the best on Celexa and trazodone. He has a history of 2 prior inpatient psychiatric hospitalization with the last one being approximately 3 years ago. She has had recurrent suicidal ideation but no suicide attempts. The patient was seeing a psychiatrist at [facility] in [place] and was also seeing a therapist there over the past one year. She reports being compliant with medications prior to admission.  Substance abuse history: The patient denies any history of any heavy alcohol use or illicit drug use  Family psychiatric history The patient reports that her son uses drugs and her daughter struggles with depression  Social history The patient previously lived in [place] for many years but moved from [place] to [place] approximately one year ago. She has been married 4 times total and has been widowed since 2000. She has one daughter who lives in [place]  and 1 son who lives out of state. [place] her son's friend who is expecting a child in 2 weeks. The patient has a good relationship with her son's girlfriend but says she has a strained relationship with both her daughter and  her son.   Associated Signs/Symptoms: Depression Symptoms:  depressed mood, anhedonia, insomnia, fatigue, recurrent thoughts of death, anxiety, (Hypo) Manic Symptoms:  None Anxiety Symptoms:  Excessive Worry, Psychotic Symptoms:  None PTSD Symptoms: None Total Time spent with patient: 1 hour    Is the patient at risk to self? Yes.    Has the patient been a risk to self in the past 6 months? Yes.    Has the patient been a risk to self within the distant past? Yes.    Is the patient a risk to others? No.  Has the patient been a risk to others in the past 6 months? No.  Has the patient been a risk to others within the distant past? No.   Prior Inpatient Therapy:  Yes Prior Outpatient Therapy:  Yes  Alcohol Screening: 1. How often do you have a drink containing alcohol?: Never 2. How many drinks containing alcohol do you have on a typical day when you are drinking?: 1 or 2 3. How often do you have six or more drinks on one occasion?: Never Preliminary Score: 0 4. How often during the last year have you found that you were not able to stop drinking once you had started?: Never 5. How often during the last year have you failed to do what was normally expected from you becasue of drinking?: Never 6. How often during the last year have you needed a first drink in the morning to get yourself going after a heavy drinking session?: Never 7. How often during the last year have you had a feeling of guilt of remorse after drinking?: Never 8. How often during the last year have you been unable to remember what happened the night before because you had been drinking?: Never 9. Have you or someone else been injured as a result of your drinking?: No 10. Has a relative or friend or a doctor or another health worker been concerned about your drinking or suggested you cut down?: No Alcohol Use Disorder Identification Test Final Score (AUDIT): 0 Brief Intervention: AUDIT score less than 7 or  less-screening does not suggest unhealthy drinking-brief intervention not indicated Substance Abuse History in the last 12 months:  No. Consequences of Substance Abuse: Negative Previous Psychotropic Medications: YES Psychological Evaluations: Yes  Past Medical History:  Past Medical History:  Diagnosis Date  . Anxiety   . Chronic back pain   . Constipation   . Depression   . Diverticulosis   . Fibromyalgia   . Gastroparesis   . Hypertension   . Hypoglycemia   . Hypokalemia   . Osteoarthritis   . Reflux   . TIA (transient ischemic attack)   . Vascular dementia   . Vitamin D deficiency     Past Surgical History:  Procedure Laterality Date  . ABDOMINAL HYSTERECTOMY    . CHOLECYSTECTOMY    . COLON SURGERY    . NECK SURGERY    . ROTATOR CUFF REPAIR    . TONSILLECTOMY      Tobacco Screening: Have you used any form of tobacco in the last 30 days? (Cigarettes, Smokeless Tobacco, Cigars, and/or Pipes): No  Allergies:   Allergies  Allergen Reactions  .  Ace Inhibitors Other (See Comments)    achy  . Beta Adrenergic Blockers Other (See Comments)    Makes achy/feel sick  . Calcium Channel Blockers Other (See Comments)    Feels achy like sick  . Tape Rash   Lab Results:  Results for orders placed or performed during the hospital encounter of 09/26/15 (from the past 48 hour(s))  Lipid panel     Status: Abnormal   Collection Time: 09/27/15  6:49 AM  Result Value Ref Range   Cholesterol 275 (H) 0 - 200 mg/dL   Triglycerides 161 (H) <150 mg/dL   HDL 48 >09 mg/dL   Total CHOL/HDL Ratio 5.7 RATIO   VLDL 51 (H) 0 - 40 mg/dL   LDL Cholesterol 604 (H) 0 - 99 mg/dL    Comment:        Total Cholesterol/HDL:CHD Risk Coronary Heart Disease Risk Table                     Men   Women  1/2 Average Risk   3.4   3.3  Average Risk       5.0   4.4  2 X Average Risk   9.6   7.1  3 X Average Risk  23.4   11.0        Use the calculated Patient Ratio above and the CHD Risk Table to  determine the patient's CHD Risk.        ATP III CLASSIFICATION (LDL):  <100     mg/dL   Optimal  540-981  mg/dL   Near or Above                    Optimal  130-159  mg/dL   Borderline  191-478  mg/dL   High  >295     mg/dL   Very High   TSH     Status: None   Collection Time: 09/27/15  6:49 AM  Result Value Ref Range   TSH 1.428 0.350 - 4.500 uIU/mL    Blood Alcohol level:  Lab Results  Component Value Date   ETH <5 09/25/2015    Metabolic Disorder Labs:  No results found for: HGBA1C, MPG No results found for: PROLACTIN Lab Results  Component Value Date   CHOL 275 (H) 09/27/2015   TRIG 255 (H) 09/27/2015   HDL 48 09/27/2015   CHOLHDL 5.7 09/27/2015   VLDL 51 (H) 09/27/2015   LDLCALC 176 (H) 09/27/2015    Current Medications: Current Facility-Administered Medications  Medication Dose Route Frequency Provider Last Rate Last Dose  . acetaminophen (TYLENOL) tablet 650 mg  650 mg Oral Q6H PRN Audery Amel, MD      . albuterol (PROVENTIL HFA;VENTOLIN HFA) 108 (90 Base) MCG/ACT inhaler 1 puff  1 puff Inhalation Q6H PRN Audery Amel, MD      . alum & mag hydroxide-simeth (MAALOX/MYLANTA) 200-200-20 MG/5ML suspension 30 mL  30 mL Oral Q4H PRN Audery Amel, MD      . aspirin EC tablet 81 mg  81 mg Oral Daily Audery Amel, MD   81 mg at 09/27/15 0805  . citalopram (CELEXA) tablet 40 mg  40 mg Oral Daily Audery Amel, MD   40 mg at 09/27/15 0805  . clonazePAM (KLONOPIN) tablet 0.75 mg  0.75 mg Oral QHS Darliss Ridgel, MD      . fluticasone (FLONASE) 50 MCG/ACT nasal spray 2 spray  2 spray Each Nare  Daily PRN Audery Amel, MD      . hydrochlorothiazide (MICROZIDE) capsule 12.5 mg  12.5 mg Oral Daily Darliss Ridgel, MD      . HYDROcodone-acetaminophen (NORCO/VICODIN) 5-325 MG per tablet 1 tablet  1 tablet Oral Q6H PRN Audery Amel, MD      . magnesium hydroxide (MILK OF MAGNESIA) suspension 30 mL  30 mL Oral Daily PRN Audery Amel, MD      . ondansetron (ZOFRAN)  tablet 4 mg  4 mg Oral Q8H PRN Audery Amel, MD      . pantoprazole (PROTONIX) EC tablet 40 mg  40 mg Oral Daily Audery Amel, MD   40 mg at 09/27/15 0805  . traZODone (DESYREL) tablet 150 mg  150 mg Oral QHS Audery Amel, MD   150 mg at 09/26/15 2118   PTA Medications: Prescriptions Prior to Admission  Medication Sig Dispense Refill Last Dose  . albuterol (PROVENTIL HFA;VENTOLIN HFA) 108 (90 Base) MCG/ACT inhaler Inhale 2 puffs into the lungs every 6 (six) hours as needed for wheezing or shortness of breath.   prn  . aspirin EC 81 MG tablet Take 81 mg by mouth daily.     . citalopram (CELEXA) 20 MG tablet Take 20 mg by mouth daily.     . clonazePAM (KLONOPIN) 1 MG tablet Take 1 mg by mouth at bedtime.     Marland Kitchen esomeprazole (NEXIUM) 40 MG capsule Take 40 mg by mouth daily at 12 noon.     . fluticasone (FLONASE) 50 MCG/ACT nasal spray Place 2 sprays into both nostrils daily as needed for allergies or rhinitis.   prn  . HYDROcodone-acetaminophen (NORCO/VICODIN) 5-325 MG tablet Take 1 tablet by mouth every 6 (six) hours as needed for moderate pain.   prn  . ondansetron (ZOFRAN) 4 MG tablet Take 4 mg by mouth every 8 (eight) hours as needed for nausea or vomiting.   prn  . traZODone (DESYREL) 150 MG tablet Take by mouth at bedtime.       Musculoskeletal: Strength & Muscle Tone: within normal limits Gait & Station: normal Patient leans: N/A  Psychiatric Specialty Exam: Physical Exam  Constitutional: She is oriented to person, place, and time. She appears well-developed and well-nourished. No distress.  HENT:  Head: Normocephalic and atraumatic.  Right Ear: External ear normal.  Left Ear: External ear normal.  Mouth/Throat: No oropharyngeal exudate.  Eyes: Conjunctivae and EOM are normal. Pupils are equal, round, and reactive to light. Right eye exhibits no discharge. Left eye exhibits no discharge. Scleral icterus is present.  Neck: Normal range of motion. Neck supple. No tracheal  deviation present. No thyromegaly present.  Cardiovascular: Normal rate, regular rhythm and normal heart sounds.  Exam reveals no friction rub.   No murmur heard. Respiratory: Effort normal and breath sounds normal. No respiratory distress. She has no wheezes. She has no rales. She exhibits no tenderness.  GI: Soft. Bowel sounds are normal. She exhibits no distension and no mass. There is no tenderness. There is no rebound and no guarding.  Musculoskeletal: She exhibits no edema, tenderness or deformity.  Lymphadenopathy:    She has no cervical adenopathy.  Neurological: She is alert and oriented to person, place, and time. She has normal reflexes. She displays normal reflexes. No cranial nerve deficit. She exhibits normal muscle tone. Coordination normal.  Skin: Skin is warm and dry. No rash noted. She is not diaphoretic.    Review of Systems  Constitutional:  Negative.  Negative for chills, diaphoresis, fever, malaise/fatigue and weight loss.  HENT: Negative.  Negative for hearing loss, sore throat and tinnitus.   Eyes: Negative.  Negative for blurred vision, double vision, photophobia and pain.  Respiratory: Negative.  Negative for cough, hemoptysis, shortness of breath and wheezing.   Cardiovascular: Negative.  Negative for chest pain, palpitations and leg swelling.  Gastrointestinal: Negative.  Negative for abdominal pain, constipation, diarrhea, heartburn, nausea and vomiting.  Genitourinary: Negative.  Negative for dysuria, frequency and urgency.  Musculoskeletal: Negative.   Skin: Negative.  Negative for itching and rash.  Neurological: Negative.  Negative for dizziness, tingling, tremors, sensory change, speech change, focal weakness, loss of consciousness, weakness and headaches.  Endo/Heme/Allergies: Negative.  Does not bruise/bleed easily.    Blood pressure (!) 125/57, pulse (!) 52, temperature 98.5 F (36.9 C), resp. rate 16, height 5\' 4"  (1.626 m), weight 61.2 kg (135 lb), SpO2  99 %.Body mass index is 23.17 kg/m.  General Appearance: Casual  Eye Contact:  Good  Speech:  Clear and Coherent and Normal Rate  Volume:  Normal  Mood:  Depressed  Affect:  Depressed  Thought Process:  Coherent, Goal Directed and Linear  Orientation:  Full (Time, Place, and Person)  Thought Content:  Logical  Suicidal Thoughts:  Yes.  without intent/plan  Homicidal Thoughts:  No  Memory:  Immediate;   Good Recent;   Good Remote;   Good  Judgement:  Good  Insight:  Good  Psychomotor Activity:  Decreased  Concentration:  Concentration: Good and Attention Span: Good  Recall:  Good  Fund of Knowledge:  Good  Language:  Good  Akathisia:  No  Handed:  Right  AIMS (if indicated):     Assets:  Communication Skills Desire for Improvement Financial Resources/Insurance Housing Social Support Transportation Vocational/Educational  ADL's:  Intact  Cognition:  WNL  Sleep:  Number of Hours: 6.15    Treatment Plan Summary:    Diagnosis Major depressive disorder, severe, recurrent, without psychotic features Vascular dementia Fibromyalgia Hypertension Osteoarthritis Moderate: Family conflict, widowed   Hassan BucklerMcKennan is a 75 year old widowed Caucasian female with a history of recurrent major depression was brought to the emergency room by her daughter secondary to worsening depressive passive suicidal thoughts. She had a plan to shoot herself. She will be admitted to inpatient psychiatry for medication management, safety and stabilization.  Major depressive disorder, recurrent, beer without psychotic features: We'll plan to increase Celexa to 40 mg by mouth daily she was on 20 mg outpatient. We'll plan to continue trazodone and 150 mg by mouth nightly for insomnia. Other considerations include Remeron given weight loss and depression. The patient has Klonopin 1 mg by mouth at bedtime and will try to slowly. Her down off the Klonopin due to age and diagnosis of dementia. We'll decrease  Klonopin to 0.75 mg by mouth bedtime and decreased family 0.25 mg every few weeks. She is independent on Klonopin for years. He was educated about negative consequences of chronic benzodiazepine use and cognition memory.  Vascular dementia: The patient does have a history of multiple TIAs as well as hypertension. At this time, she declined any medications to help with cognition and memory. He denies any recent problems with focus and concentration.  COPD: We'll plan to continue albuterol inhalers. No respiratory distress  Fibromyalgia: We'll plan to continue hydrocodone/acetaminophen 5/325, one tablet every 6 hours as needed. The patient was receiving opiates per the controlled substance database.  GERD: We'll plan to continue Protonix  40 mg by mouth daily  Hypertension: We'll continue hydrochlorothiazide 12.5 mg by mouth daily. Vital signs stable. He is also on aspirin 81 mg by mouth daily.  Disposition: The patient does have a stable living situation. She likes her therapist at [facility] that would like to new psychiatrist in the area for psychotropic medication management at discharge.     Daily contact with patient to assess and evaluate symptoms and progress in treatment and Medication management                   Physician Treatment Plan for Primary Diagnosis: <principal problem not specified> Long Term Goal(s): Improvement in symptoms so as ready for discharge  Short Term Goals: Ability to identify changes in lifestyle to reduce recurrence of condition will improve, Ability to verbalize feelings will improve, Ability to disclose and discuss suicidal ideas, Ability to demonstrate self-control will improve, Ability to identify and develop effective coping behaviors will improve, Ability to maintain clinical measurements within normal limits will improve, Compliance with prescribed medications will improve and Ability to identify triggers associated with substance  abuse/mental health issues will improve  Physician Treatment Plan for Secondary Diagnosis: Active Problems:   Severe recurrent major depression without psychotic features (HCC)  Long Term Goal(s): Improvement in symptoms so as ready for discharge  Short Term Goals: Ability to identify changes in lifestyle to reduce recurrence of condition will improve, Ability to verbalize feelings will improve, Ability to disclose and discuss suicidal ideas, Ability to demonstrate self-control will improve, Ability to identify and develop effective coping behaviors will improve, Ability to maintain clinical measurements within normal limits will improve, Compliance with prescribed medications will improve and Ability to identify triggers associated with substance abuse/mental health issues will improve  I certify that inpatient services furnished can reasonably be expected to improve the patient's condition.    Levora Angel, MD 9/2/20173:29 PM

## 2015-09-27 NOTE — Plan of Care (Signed)
Problem: Medication: Goal: Compliance with prescribed medication regimen will improve Outcome: Progressing Med compliant. Understands medications prescribed

## 2015-09-27 NOTE — Progress Notes (Signed)
D: Pt presents pleasant and cooperative with care. Reports sleeping good without medication. Reports appetite and concentration is good. Rates depression, hopelessness and anxiety at 0. Denies SI, HI, AVH. Denies having any physical pain. Reports that medications make her sick in the am. A: Encouragement and support offered. Encouraged patient to attend groups. Medication given as prescribed. Educated pt on antidepressant med. Safety checks maintained. R: Pt receptive and remains safe on unit with q 15 min checks.

## 2015-09-27 NOTE — Progress Notes (Signed)
Pt admitted to unit without issue. Skin check completed and no abnormalities or contraband found. Endorses passive SI, no plan; contracts for safety. Denies HI/AVH. Pt states that she wants help with depression and medication management. Stressors are living situation and feelings of being responsible for Sons pregnant GF as he has still not relocated to the area to be with her. Affect depressed. Pleasant during interaction and oriented x4. Gait steady. Medication compliant. Visible in milieu, appropriate with peers and staff. Oriented to unit and voices no additional concerns at this time.

## 2015-09-27 NOTE — [facility] Group Notes (Signed)
[facility] LCSW Group Therapy  09/27/2015 3:16 PM  Type of Therapy:  Group Therapy  Participation Level:  Active  Participation Quality:  Appropriate, Attentive and Sharing  Affect:  Appropriate  Cognitive:  Alert  Insight:  Engaged  Engagement in Therapy:  Engaged  Modes of Intervention:  Activity, Clarification, Discussion, Problem-solving and Support  Summary of Progress/Problems: Protective Factors: Patients defined protective factors and discussed the importance recognizing their personal strengths. Patients identified their own protective factors and resiliency. Patients discussed building upon those protective factors to improve their ability to cope with life challenges. Patient discussed improving her support system to ensure she has support in the community to help when she's feeling depressed.    Marcelino Campos G. Garnette CzechSampson MSW, LCSWA 09/27/2015, 3:27 PM

## 2015-09-27 NOTE — [facility] Suicide Risk Assessment (Signed)
[facility] Admission Suicide Risk Assessment   Nursing information obtained from:   Chart and patient Demographic factors:   75 y/o widowed Caucasian female Current Mental Status:   See below Loss Factors:   Widowed, conflict with daughter and son Historical Factors:   2 prior hospitalizations for depression Risk Reduction Factors:   Individual therapy   Total Time spent with patient: 1 hour Principal Problem: Major Depressive Disorder, Severe, Recurrent Diagnosis:   Patient Active Problem List   Diagnosis Date Noted  . Severe recurrent major depression without psychotic features (HCC) [F33.2] 09/26/2015  . Suicidal ideation [R45.851] 09/26/2015  . Fibromyalgia [M79.7] 09/26/2015  . Osteoarthritis [M19.90] 09/26/2015  . Hypertension [I10] 09/26/2015   History of present illness  Ms. and is a 75 year old widowed Caucasian female with a history of recurrent major depression was brought to the emergency room by her daughter who lives in [place]. The patient says her depressive symptoms have progressively gotten worse over the past one year. She moved to [place] from [place] approximately 1 year ago and has lived in [place] for several years. She says she does not have a lot of friends here and is very lonely. She initially moved to be closer to her daughter but she and her daughter have been having some conflict. She also reports conflict with her son. Her son lives in [place] and his girlfriend is now living with the patient as she is expecting a baby in 2 weeks. The patient feels like her son has abandoned the girlfriend and the child. She has a very good relationship with her son's girlfriend who lives with her but feels that her biological daughter is jealous of her relationship with his son's girlfriend. She says her daughter is also jealous of her son. The patient had suicidal thoughts with a plan to shoot herself. She does not have a gun in the house and the gun was left at her daughter's house  in [place] over 1 year ago. She does report anhedonia, feelings of hopelessness, low energy and frequent crying spells. She has had problems with insomnia but takes trazodone and Klonopin to help with sleep. She has had a 20 pound weight loss and decreased appetite over the past one year. She denies any symptoms consistent with bipolar mania including grandiose delusions, decreased sleep with increased goal-directed behavior, hyperreligious thoughts, hypersexual behavior. She denies any history of any psychotic symptoms including auditory or visual hallucinations. No paranoid thoughts or delusions. She denies any history of any heavy alcohol use or illicit drug use. The patient was seeing a psychiatrist and a therapist at [facility] in [place]. She likes her therapist there but does not have a good rapport with her psychiatrist.   Past psychiatric history: The patient says she first started taking antidepressants in her 79s and has tried multiple antidepressants in the past but cannot remember their name. She did the best on Celexa and trazodone. He has a history of 2 prior inpatient psychiatric hospitalization with the last one being approximately 3 years ago. She has had recurrent suicidal ideation but no suicide attempts. The patient was seeing a psychiatrist at [facility] in [place] and was also seeing a therapist there over the past one year. She reports being compliant with medications prior to admission.  Substance abuse history: The patient denies any history of any heavy alcohol use or illicit drug use  Family psychiatric history The patient reports that her son uses drugs and her daughter struggles with  depression  Social history The patient previously lived in [place] for many years but moved from [place] to [place] approximately one year ago. She has been married 4 times total and has been widowed since 2000. She has one daughter who lives in [place] and 1 son who  lives out of state. [place] son's friend who is expecting a child in 2 weeks. The patient has a good relationship with her son's girlfriend but says she has a strained relationship with both her daughter and her son.   Continued Clinical Symptoms:  Alcohol Use Disorder Identification Test Final Score (AUDIT): 0 The "Alcohol Use Disorders Identification Test", Guidelines for Use in Primary Care, Second Edition.  World Science writerHealth Organization [facility](WHO). Score between 0-7:  no or low risk or alcohol related problems. Score between 8-15:  moderate risk of alcohol related problems. Score between 16-19:  high risk of alcohol related problems. Score 20 or above:  warrants further diagnostic evaluation for alcohol dependence and treatment.   CLINICAL FACTORS:   Depression:   Anhedonia Insomnia Severe Chronic Pain Previous Psychiatric Diagnoses and Treatments Medical Diagnoses and Treatments/Surgeries   Musculoskeletal: Strength & Muscle Tone: within normal limits Gait & Station: normal Patient leans: N/A  Psychiatric Specialty Exam: Physical Exam  Review of Systems  Constitutional: Negative.  Negative for chills, diaphoresis, fever, malaise/fatigue and weight loss.  HENT: Negative.  Negative for congestion, hearing loss, sore throat and tinnitus.   Eyes: Negative.  Negative for blurred vision, double vision, photophobia, pain, discharge and redness.  Respiratory: Negative.  Negative for cough, hemoptysis, sputum production, shortness of breath and wheezing.   Cardiovascular: Negative.  Negative for chest pain, palpitations and leg swelling.  Gastrointestinal: Negative.  Negative for abdominal pain, constipation, diarrhea, heartburn, nausea and vomiting.  Genitourinary: Negative.  Negative for dysuria, frequency and urgency.  Musculoskeletal: Positive for back pain, joint pain and myalgias. Negative for falls and neck pain.  Skin: Negative.  Negative for  itching and rash.  Neurological: Negative.  Negative for dizziness, tingling, tremors, sensory change, speech change, focal weakness, seizures and headaches.  Endo/Heme/Allergies: Negative.  Negative for environmental allergies. Does not bruise/bleed easily.    Blood pressure (!) 125/57, pulse (!) 52, temperature 98.5 F (36.9 C), resp. rate 16, height 5\' 4"  (1.626 m), weight 61.2 kg (135 lb), SpO2 99 %.Body mass index is 23.17 kg/m.  General Appearance: Casual  Eye Contact:  Good  Speech:  Clear and Coherent and Normal Rate  Volume:  Normal  Mood:  Depressed  Affect:  Depressed  Thought Process:  Coherent and Goal Directed  Orientation:  Full (Time, Place, and Person)  Thought Content:  Logical  Suicidal Thoughts:  Yes.  without intent/plan  Homicidal Thoughts:  No  Memory:  Immediate;   Good Recent;   Good Remote;   Good  Judgement:  Good  Insight:  Good  Psychomotor Activity:  Normal  Concentration:  Concentration: Fair and Attention Span: Fair  Recall:  [employer]  Fund of Knowledge:  Good  Language:  Good  Akathisia:  No  Handed:  Right  AIMS (if indicated):     Assets:  Communication Skills Desire for Improvement Financial Resources/Insurance Housing Leisure Time Transportation Vocational/Educational  ADL's:  Intact  Cognition:  WNL  Sleep:  Number of Hours: 6.15      COGNITIVE FEATURES THAT CONTRIBUTE TO RISK:  Vascular Dementia  SUICIDE RISK:   Mild:  Suicidal ideation of limited frequency, intensity,  duration, and specificity.  There are no identifiable plans, no associated intent, mild dysphoria and related symptoms, good self-control (both objective and subjective assessment), few other risk factors, and identifiable protective factors, including available and accessible social support.   PLAN OF CARE:  Diagnosis Major depressive disorder, severe, recurrent, without psychotic features Vascular dementia Fibromyalgia Hypertension Osteoarthritis Moderate:  Family conflict, widowed   Hassan Buckler is a 75 year old widowed Caucasian female with a history of recurrent major depression was brought to the emergency room by her daughter secondary to worsening depressive passive suicidal thoughts. She had a plan to shoot herself. She will be admitted to inpatient psychiatry for medication management, safety and stabilization.  Major depressive disorder, recurrent, beer without psychotic features: We'll plan to increase Celexa to 40 mg by mouth daily she was on 20 mg outpatient. We'll plan to continue trazodone and 150 mg by mouth nightly for insomnia. Other considerations include Remeron given weight loss and depression. The patient has Klonopin 1 mg by mouth at bedtime and will try to slowly. Her down off the Klonopin due to age and diagnosis of dementia. We'll decrease Klonopin to 0.75 mg by mouth bedtime and decreased family 0.25 mg every few weeks. She is independent on Klonopin for years. He was educated about negative consequences of chronic benzodiazepine use and cognition memory.  Vascular dementia: The patient does have a history of multiple TIAs as well as hypertension. At this time, she declined any medications to help with cognition and memory. He denies any recent problems with focus and concentration.  COPD: We'll plan to continue albuterol inhalers. No respiratory distress  Fibromyalgia: We'll plan to continue hydrocodone/acetaminophen 5/325, one tablet every 6 hours as needed. The patient was receiving opiates per the controlled substance database.  GERD: We'll plan to continue Protonix 40 mg by mouth daily  Hypertension: We'll continue hydrochlorothiazide 12.5 mg by mouth daily. Vital signs stable. He is also on aspirin 81 mg by mouth daily.  Disposition: The patient does have a stable living situation. She likes her therapist at [facility] that would like to new psychiatrist in the area for psychotropic medication management at  discharge.   I certify that inpatient services furnished can reasonably be expected to improve the patient's condition.  Levora Angel, MD 09/27/2015, 3:19 PM

## 2015-09-28 NOTE — [facility] Group Notes (Signed)
[facility] LCSW Group Therapy  09/28/2015 1:58 PM  Type of Therapy:  Group Therapy  Participation Level:  Active  Participation Quality:  Attentive and Sharing  Affect:  Appropriate  Cognitive:  Appropriate  Insight:  Developing/Improving  Engagement in Therapy:  Engaged  Modes of Intervention:  Activity, Discussion and Support  Summary of Progress/Problems: Coping Skills: Patients defined and discussed healthy coping skills. Patients identified healthy coping skills they would like to try during hospitalization and after discharge. CSW offered insight to varying coping skills that may have been new to patients such as practicing mindfulness. Patient stated the mindfulness exercise made her feel calm and relaxed. Patient was attentive during group and had appropriate insight to the group discussion.   Cherylanne Ardelean G. Garnette CzechSampson MSW, LCSWA 09/28/2015, 2:02 PM

## 2015-09-28 NOTE — Progress Notes (Addendum)
[facility]  09/28/2015 12:40 PM Maria CoronaMary L Horton  MRN:  [id number] Subjective: Major Depression and suicidal thoughts Principal Problem: Major Depression and suicidal thoughts  Subjective The patient reports that she slept well last night. He was calm today and the patient was smiling. She says she felt like she could communicate freely here with other patients and enjoyed the interaction with the younger patients. She continues to endorse some intermittent passive suicidal thoughts but no active suicidal thoughts. She fears her son coming to visit after her grandson is born and does not necessarily want him to stay in her house. She did have her daughter come visit yesterday but her daughter does not want her to tell her son not to stay in the house. She has a difficult time setting limits with her children. She denies any psychotic symptoms. She has been eating a little more here than at home. She denies any new somatic complaints. The patient has been attending groups on a regular basis and interacting well with staff and peers.  Supportive psychotherapy provided and times spent discussing passive aggressive behavior and how the patient needs to be more assertive with her children. Times spent doing some role-playing practicing statements that the patient continues with her children. She was encouraged to set limits and boundaries with her daughter and son. She feels very hurt and neglected by her children.  Past psychiatric history: The patient says she first started taking antidepressants in her 8940s and has tried multiple antidepressants in the past but cannot remember their name. She did the best on Celexa and trazodone. He has a history of 2 prior inpatient psychiatric hospitalization with the last one being approximately 3 years ago. She has had recurrent suicidal ideation but no suicide attempts. The patient was seeing a psychiatrist at [facility] [place] and was also  seeing a therapist there over the past one year. She reports being compliant with medications prior to admission.  Substance abuse history: The patient denies any history of any heavy alcohol use or illicit drug use  Family psychiatric history The patient reports that her son uses drugs and her daughter struggles with depression  Social history The patient previously lived in [place] for many years but moved from [place] to [place] approximately one year ago. She has been married 4 times total and has been widowed since 2000. She has one daughter who lives in [place] and 1 son who lives out of state. [place] her son's friend who is expecting a child in 2 weeks. The patient has a good relationship with her son's girlfriend but says she has a strained relationship with both her daughter and her son.   Diagnosis:   Patient Active Problem List   Diagnosis Date Noted  . Severe recurrent major depression without psychotic features (HCC) [F33.2] 09/26/2015  . Suicidal ideation [R45.851] 09/26/2015  . Fibromyalgia [M79.7] 09/26/2015  . Osteoarthritis [M19.90] 09/26/2015  . Hypertension [I10] 09/26/2015   Total Time spent with patient: 30 minutes   Past Medical History:  Past Medical History:  Diagnosis Date  . Anxiety   . Chronic back pain   . Constipation   . Depression   . Diverticulosis   . Fibromyalgia   . Gastroparesis   . Hypertension   . Hypoglycemia   . Hypokalemia   . Osteoarthritis   . Reflux   . TIA (transient ischemic attack)   . Vascular dementia   .  Vitamin D deficiency     Past Surgical History:  Procedure Laterality Date  . ABDOMINAL HYSTERECTOMY    . CHOLECYSTECTOMY    . COLON SURGERY    . NECK SURGERY    . ROTATOR CUFF REPAIR    . TONSILLECTOMY     * Social History:  History  Alcohol Use No     History  Drug Use No    Social History   Social History  . Marital status: Widowed    Spouse name: N/A  .  Number of children: N/A  . Years of education: N/A   Social History Main Topics  . Smoking status: Never Smoker  . Smokeless tobacco: Never Used  . Alcohol use No  . Drug use: No  . Sexual activity: Not Asked   Other Topics Concern  . None   Social History Narrative  . None    Sleep: Good  Appetite:  Improved  Current Medications: Current Facility-Administered Medications  Medication Dose Route Frequency Provider Last Rate Last Dose  . acetaminophen (TYLENOL) tablet 650 mg  650 mg Oral Q6H PRN Audery Amel, MD      . albuterol (PROVENTIL HFA;VENTOLIN HFA) 108 (90 Base) MCG/ACT inhaler 1 puff  1 puff Inhalation Q6H PRN Audery Amel, MD      . alum & mag hydroxide-simeth (MAALOX/MYLANTA) 200-200-20 MG/5ML suspension 30 mL  30 mL Oral Q4H PRN Audery Amel, MD      . aspirin EC tablet 81 mg  81 mg Oral Daily Audery Amel, MD   81 mg at 09/28/15 0855  . citalopram (CELEXA) tablet 40 mg  40 mg Oral Daily Audery Amel, MD   40 mg at 09/28/15 0855  . clonazePAM (KLONOPIN) tablet 0.75 mg  0.75 mg Oral QHS Darliss Ridgel, MD   0.75 mg at 09/27/15 2107  . fluticasone (FLONASE) 50 MCG/ACT nasal spray 2 spray  2 spray Each Nare Daily PRN Audery Amel, MD      . hydrochlorothiazide (MICROZIDE) capsule 12.5 mg  12.5 mg Oral Daily Darliss Ridgel, MD   12.5 mg at 09/28/15 0855  . HYDROcodone-acetaminophen (NORCO/VICODIN) 5-325 MG per tablet 1 tablet  1 tablet Oral Q6H PRN Audery Amel, MD   1 tablet at 09/27/15 2107  . magnesium hydroxide (MILK OF MAGNESIA) suspension 30 mL  30 mL Oral Daily PRN Audery Amel, MD      . ondansetron (ZOFRAN) tablet 4 mg  4 mg Oral Q8H PRN Audery Amel, MD      . pantoprazole (PROTONIX) EC tablet 40 mg  40 mg Oral Daily Audery Amel, MD   40 mg at 09/28/15 0856  . traZODone (DESYREL) tablet 150 mg  150 mg Oral QHS Audery Amel, MD   150 mg at 09/27/15 2108    Lab Results:  Results for orders placed or performed during the hospital encounter of  09/26/15 (from the past 48 hour(s))  Hemoglobin A1c     Status: None   Collection Time: 09/27/15  6:49 AM  Result Value Ref Range   Hgb A1c MFr Bld 5.6 4.0 - 6.0 %  Lipid panel     Status: Abnormal   Collection Time: 09/27/15  6:49 AM  Result Value Ref Range   Cholesterol 275 (H) 0 - 200 mg/dL   Triglycerides 161 (H) <150 mg/dL   HDL 48 >09 mg/dL   Total CHOL/HDL Ratio 5.7 RATIO   VLDL 51 (H)  0 - 40 mg/dL   LDL Cholesterol 161 (H) 0 - 99 mg/dL    Comment:        Total Cholesterol/HDL:CHD Risk Coronary Heart Disease Risk Table                     Men   Women  1/2 Average Risk   3.4   3.3  Average Risk       5.0   4.4  2 X Average Risk   9.6   7.1  3 X Average Risk  23.4   11.0        Use the calculated Patient Ratio above and the CHD Risk Table to determine the patient's CHD Risk.        ATP III CLASSIFICATION (LDL):  <100     mg/dL   Optimal  096-045  mg/dL   Near or Above                    Optimal  130-159  mg/dL   Borderline  409-811  mg/dL   High  >914     mg/dL   Very High   TSH     Status: None   Collection Time: 09/27/15  6:49 AM  Result Value Ref Range   TSH 1.428 0.350 - 4.500 uIU/mL    Blood Alcohol level:  Lab Results  Component Value Date   ETH <5 09/25/2015    Metabolic Disorder Labs: Lab Results  Component Value Date   HGBA1C 5.6 09/27/2015   No results found for: PROLACTIN Lab Results  Component Value Date   CHOL 275 (H) 09/27/2015   TRIG 255 (H) 09/27/2015   HDL 48 09/27/2015   CHOLHDL 5.7 09/27/2015   VLDL 51 (H) 09/27/2015   LDLCALC 176 (H) 09/27/2015     Musculoskeletal: Strength & Muscle Tone: within normal limits Gait & Station: normal Patient leans: N/A  Psychiatric Specialty Exam: Physical Exam  Review of Systems  Constitutional: Negative.  Negative for chills, fever and weight loss.  HENT: Negative.  Negative for congestion, hearing loss, sore throat and tinnitus.   Eyes: Negative.  Negative for blurred vision, double  vision and photophobia.  Respiratory: Negative.  Negative for cough, hemoptysis, sputum production and wheezing.   Cardiovascular: Negative for chest pain, palpitations, orthopnea and leg swelling.  Gastrointestinal: Negative.  Negative for heartburn, nausea and vomiting.  Genitourinary: Negative.  Negative for dysuria, frequency and urgency.  Musculoskeletal: Negative.  Negative for back pain, falls, joint pain, myalgias and neck pain.  Skin: Negative.  Negative for itching.  Neurological: Negative.  Negative for dizziness, tingling, tremors, sensory change, speech change, focal weakness and headaches.  Endo/Heme/Allergies: Negative.  Does not bruise/bleed easily.    Blood pressure (!) 147/68, pulse (!) 47, temperature 98.4 F (36.9 C), temperature source Oral, resp. rate 18, height 5\' 4"  (1.626 m), weight 61.2 kg (135 lb), SpO2 99 %.Body mass index is 23.17 kg/m.  General Appearance: Casual  Eye Contact:  Good  Speech:  Clear and Coherent and Normal Rate  Volume:  Normal  Mood:  " A little better today"  Affect:  Depressed  Thought Process:  Coherent, Goal Directed and Linear  Orientation:  Full (Time, Place, and Person)  Thought Content:  Logical  Suicidal Thoughts:  Intermittent passive suicidal thoughts  Homicidal Thoughts:  No  Memory:  Immediate;   Fair Recent;   Fair Remote;   Fair  Judgement:  Good  Insight:  Good  Psychomotor Activity:  Normal  Concentration:  Concentration: Fair and Attention Span: Fair  Recall:  [employer] of Knowledge:  Good  Language:  Good  Akathisia:  No  Handed:  Right  AIMS (if indicated):     Assets:  Communication Skills Desire for Improvement Financial Resources/Insurance Housing Physical Health Social Support Transportation Vocational/Educational  ADL's:  Intact  Cognition:  WNL  Sleep:  Number of Hours: 8     Treatment Plan Summary:   Diagnosis Major depressive disorder, severe, recurrent, without psychotic  features Vascular dementia Fibromyalgia Hypertension Osteoarthritis Moderate: Family conflict, widowed   Maria Horton is a 75 year old widowed Caucasian female with a history of recurrent major depression was brought to the emergency room by her daughter secondary to worsening depressive passive suicidal thoughts. She had a plan to shoot herself. She will be admitted to inpatient psychiatry for medication management, safety and stabilization.  Major depressive disorder, recurrent, beer without psychotic features: Increased Celexa 40 mg by mouth daily as she was on 20 mg outpatient. Will plan to continue trazodone and 150 mg by mouth nightly for insomnia. Other considerations include Remeron given weight loss and depression. The patient has Klonopin 1 mg by mouth at bedtime and will try to slowly. Her down off the Klonopin due to age and diagnosis of dementia. We'll decrease Klonopin to 0.75 mg by mouth bedtime and decreased family 0.25 mg every few weeks. She is independent on Klonopin for years. He was educated about negative consequences of chronic benzodiazepine use and cognition memory.  Vascular dementia: The patient does have a history of multiple TIAs as well as hypertension. At this time, she declined any medications to help with cognition and memory. He denies any recent problems with focus and concentration.  COPD: We'll plan to continue albuterol inhalers. No respiratory distress  Fibromyalgia: We'll plan to continue hydrocodone/acetaminophen 5/325, one tablet every 6 hours as needed. The patient was receiving opiates per the controlled substance database.  GERD: We'll plan to continue Protonix 40 mg by mouth daily  Hypertension: We'll continue hydrochlorothiazide 12.5 mg by mouth daily. Vital signs stable. He is also on aspirin 81 mg by mouth daily.  Disposition: The patient does have a stable living situation. She likes her therapist at [facility] that would like  to new psychiatrist in the area for psychotropic medication management at discharge.   Daily contact with patient to assess and evaluate symptoms and progress in treatment and Medication management  Levora Angel, MD 09/28/2015, 12:40 PM

## 2015-09-28 NOTE — [facility] Group Notes (Signed)
[facility] Group Notes:  (Nursing/MHT/Case Management/Adjunct)  Date:  09/28/2015  Time:  9:44 PM  Type of Therapy:  Psychoeducational Skills  Participation Level:  Active  Participation Quality:  Appropriate, Attentive, Sharing and Supportive  Affect:  Appropriate  Cognitive:  Appropriate  Insight:  Appropriate and Good  Engagement in Group:  Engaged and Supportive  Modes of Intervention:  Discussion, Socialization and Support  Summary of Progress/Problems:  Chancy MilroyLaquanda Y Kevron Patella 09/28/2015, 9:44 PM
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
## 2015-09-28 NOTE — [facility] Group Notes (Signed)
[facility] Group Notes:  (Nursing/MHT/Case Management/Adjunct)  Date:  09/28/2015  Time:  3:48 AM  Type of Therapy:  Psychoeducational Skills  Participation Level:  Active  Participation Quality:  Appropriate, Sharing and Supportive  Affect:  Appropriate  Cognitive:  Appropriate  Insight:  Appropriate and Good  Engagement in Group:  Engaged and Supportive  Modes of Intervention:  Discussion, Socialization and Support  Summary of Progress/Problems:  Chancy MilroyLaquanda Y Laurann Mcmorris 09/28/2015, 3:48 AM

## 2015-09-28 NOTE — [facility] Suicide Risk Assessment (Signed)
[facility] INPATIENT:  Family/Significant Other Suicide Prevention Education  Suicide Prevention Education:  Education Completed; Maria Horton (daughter [phone]), has been identified by the patient as the family member/significant other with whom the patient will be residing, and identified as the person(s) who will aid the patient in the event of a mental health crisis (suicidal ideations/suicide attempt).  With written consent from the patient, the family member/significant other has been provided the following suicide prevention education, prior to the and/or following the discharge of the patient. Family member was able to discuss patients warning signs for depression and suicidal thoughts. Family member reports she is scared because patient has been impulsivity buying things recently. Family member reports she is worried that her mother is going to go bankrupt.   The suicide prevention education provided includes the following:  Suicide risk factors  Suicide prevention and interventions  National Suicide Hotline telephone number  [facility] assessment telephone number  [facility] Emergency Assistance 911  [facility] and/or Residential Mobile Crisis Unit telephone number  Request made of family/significant other to:  Remove weapons (e.g., guns, rifles, knives), all items previously/currently identified as safety concern.    Remove drugs/medications (over-the-counter, prescriptions, illicit drugs), all items previously/currently identified as a safety concern.  The family member/significant other verbalizes understanding of the suicide prevention education information provided.  The family member/significant other agrees to remove the items of safety concern listed above.  Maria Horton MSW, LCSWA 09/28/2015, 3:08 PM

## 2015-09-28 NOTE — Plan of Care (Signed)
Problem: Activity: Goal: Interest or engagement in leisure activities will improve Outcome: Progressing Pt has been attending evening wrap up groups and socializing in dayroom

## 2015-09-28 NOTE — Progress Notes (Signed)
Pt denies SI/HI/AVH. Attended evening group. Participates in Tx. Medication compliant. Given Norco or lower back pain/osteoarthritic pain. Pleasant and appropriate during interaction. Voices no additional concerns at this time. Safety maintained.

## 2015-09-29 ENCOUNTER — Encounter: Payer: Self-pay | Admitting: Psychiatry

## 2015-09-29 DIAGNOSIS — F332 Major depressive disorder, recurrent severe without psychotic features: Principal | ICD-10-CM

## 2015-09-29 DIAGNOSIS — K219 Gastro-esophageal reflux disease without esophagitis: Secondary | ICD-10-CM | POA: Diagnosis present

## 2015-09-29 MED ORDER — ALUM HYDROXIDE-MAG CARBONATE 95-358 MG/15ML PO SUSP
15.0000 mL | Freq: Four times a day (QID) | ORAL | Status: DC | PRN
  Filled 2015-09-29: qty 15

## 2015-09-29 NOTE — Plan of Care (Signed)
Problem: Education: Goal: Knowledge of [place] [facility] Education information/materials will improve Outcome: Progressing Attending unit programing ,  Verbalization of feeling in group  Identifying diagnosis

## 2015-09-29 NOTE — Progress Notes (Signed)
VSS, A&Ox3, c/o Pain 8/10 back and legs, "I came in because I was depressed and have suicidal thoughts but I don't feel like that anymore; I have no plan .Marland Kitchen.Marland Kitchen."

## 2015-09-29 NOTE — Progress Notes (Signed)
Pt denies SI/HI/AVH. Attended evening group. Participates in Tx. Medication compliant. Given Norco or lower back pain/osteoarthritic pain. Affect noted to be brighter. Pleasant and appropriate during interaction. Voices no additional concerns at this time. Safety maintained.

## 2015-09-29 NOTE — Plan of Care (Signed)
Problem: Activity: Goal: Imbalance in normal sleep/wake cycle will improve Outcome: Progressing Pt sleeping over 6 hrs per night.

## 2015-09-29 NOTE — Progress Notes (Signed)
Patient ID: Maria CoronaMary L Pontius, female   DOB: [birth year], 75 y.o.   MRN: [id number] Per State regulations 482.30 this chart was reviewed for medical necessity with respect to the patient's admission/duration of stay.    Next review date: 09/30/15  Thurman CoyerEric Joban Colledge, BSN, RN-BC  Case Manager

## 2015-09-29 NOTE — [facility] Group Notes (Signed)
[facility]  09/29/2015 3:10 PM  Did not attend  Glennon MacSara P Viann Nielson, MSW, LCSW 09/29/2015, 3:10 PM

## 2015-09-29 NOTE — Plan of Care (Signed)
Problem: Safety: Goal: Periods of time without injury will increase Outcome: Progressing Medications administered as ordered (Klonopin 1.5 mg or 0.75 mg and Trazodone 150 mg) by the physician, medications Therapeutic Effects, SEs and Adverse effects discussed, questions encouraged; Hydrocodone 1 Tablet PRN given for 8/10 back and leg chronic pain, 15 minute checks maintained for safety, clinical and moral support provided, patient encouraged to continue to express feelings and demonstrate safe care. Patient remains free from harm, will continue to monitor.

## 2015-09-29 NOTE — Progress Notes (Signed)
D: Patient voice of wanting to be more positive with her future. Patient stated slept good last night .Stated appetite is good and energy level  Is normal. Stated concentration is good . Stated on Depression scale 5 , hopeless 5 and anxiety 5  .( low 0-10 high) Denies suicidal  homicidal ideations  .  No auditory hallucinations  No pain concerns . Appropriate ADL'S. Interacting with peers and staff.  A: Encourage patient participation with unit programming . Instruction  Given on  Medication , verbalize understanding. R: Voice no other concerns. Staff continue to monitor

## 2015-09-29 NOTE — [facility] Group Notes (Signed)
[facility] Group Notes:  (Nursing/MHT/Case Management/Adjunct)  Date:  09/29/2015  Time:  9:39 PM  Type of Therapy:  Wrap-up Group  Participation Level:  Active  Participation Quality:  Appropriate and Attentive  Affect:  Appropriate  Cognitive:  Alert and Appropriate  Insight:  Appropriate and Good  Engagement in Group:  Engaged  Modes of Intervention:  Activity  Summary of Progress/Problems:  Maria MorrowChelsea Horton Maria Horton 09/29/2015, 9:39 PM

## 2015-09-29 NOTE — Progress Notes (Signed)
[facility] MD Progress Note  09/29/2015 12:20 PM Maria Horton  MRN:  [id number] Subjective: Major Depression and suicidal thoughts Principal Problem: Major Depression and suicidal thoughts  Subjective Follow-up note for Monday the fourth. 75 year old woman with recurrent major depression. She reports her mood is feeling better. Doesn't feel depressed. Denies suicidal ideation. Still feels anxious and nervous and has realistic worries about discharge planning. Physically she is complaining of some nausea today. Not actually throwing up. Asian was able to discuss her family issues and discharge planning in some detail. Past psychiatric history: The patient says she first started taking antidepressants in her 48s and has tried multiple antidepressants in the past but cannot remember their name. She did the best on Celexa and trazodone. He has a history of 2 prior inpatient psychiatric hospitalization with the last one being approximately 3 years ago. She has had recurrent suicidal ideation but no suicide attempts. The patient was seeing a psychiatrist at [facility] in [place] and was also seeing a therapist there over the past one year. She reports being compliant with medications prior to admission.  Substance abuse history: The patient denies any history of any heavy alcohol use or illicit drug use  Family psychiatric history The patient reports that her son uses drugs and her daughter struggles with depression  Social history The patient previously lived in [place] for many years but moved from [place] to [place] approximately one year ago. She has been married 4 times total and has been widowed since 2000. She has one daughter who lives in [place] and 1 son who lives out of state. [place] her son's friend who is expecting a child in 2 weeks. The patient has a good relationship with her son's girlfriend but says she has a strained relationship with both  her daughter and her son.   Diagnosis:   Patient Active Problem List   Diagnosis Date Noted  . Severe recurrent major depression without psychotic features (HCC) [F33.2] 09/26/2015  . Suicidal ideation [R45.851] 09/26/2015  . Fibromyalgia [M79.7] 09/26/2015  . Osteoarthritis [M19.90] 09/26/2015  . Hypertension [I10] 09/26/2015   Total Time spent with patient: 30 minutes   Past Medical History:  Past Medical History:  Diagnosis Date  . Anxiety   . Chronic back pain   . Constipation   . Depression   . Diverticulosis   . Fibromyalgia   . Gastroparesis   . Hypertension   . Hypoglycemia   . Hypokalemia   . Osteoarthritis   . Reflux   . TIA (transient ischemic attack)   . Vascular dementia   . Vitamin D deficiency     Past Surgical History:  Procedure Laterality Date  . ABDOMINAL HYSTERECTOMY    . CHOLECYSTECTOMY    . COLON SURGERY    . NECK SURGERY    . ROTATOR CUFF REPAIR    . TONSILLECTOMY     * Social History:  History  Alcohol Use No     History  Drug Use No    Social History   Social History  . Marital status: Widowed    Spouse name: N/A  . Number of children: N/A  . Years of education: N/A   Social History Main Topics  . Smoking status: Never Smoker  . Smokeless tobacco: Never Used  . Alcohol use No  . Drug use: No  . Sexual activity: Not Asked   Other Topics Concern  . None   Social  History Narrative  . None    Sleep: Good  Appetite:  Improved  Current Medications: Current Facility-Administered Medications  Medication Dose Route Frequency Provider Last Rate Last Dose  . acetaminophen (TYLENOL) tablet 650 mg  650 mg Oral Q6H PRN Audery Amel, MD      . albuterol (PROVENTIL HFA;VENTOLIN HFA) 108 (90 Base) MCG/ACT inhaler 1 puff  1 puff Inhalation Q6H PRN Audery Amel, MD      . alum & mag hydroxide-simeth (MAALOX/MYLANTA) 200-200-20 MG/5ML suspension 30 mL  30 mL Oral Q4H PRN Audery Amel, MD      . aluminum hydroxide-magnesium  carbonate (GAVISCON) 95-358 MG/15ML suspension 15 mL  15 mL Oral Q6H PRN Audery Amel, MD      . aspirin EC tablet 81 mg  81 mg Oral Daily Audery Amel, MD   81 mg at 09/29/15 4098  . citalopram (CELEXA) tablet 40 mg  40 mg Oral Daily Audery Amel, MD   40 mg at 09/29/15 1191  . clonazePAM (KLONOPIN) tablet 0.75 mg  0.75 mg Oral QHS Darliss Ridgel, MD   0.75 mg at 09/28/15 2107  . fluticasone (FLONASE) 50 MCG/ACT nasal spray 2 spray  2 spray Each Nare Daily PRN Audery Amel, MD      . hydrochlorothiazide (MICROZIDE) capsule 12.5 mg  12.5 mg Oral Daily Darliss Ridgel, MD   12.5 mg at 09/29/15 4782  . HYDROcodone-acetaminophen (NORCO/VICODIN) 5-325 MG per tablet 1 tablet  1 tablet Oral Q6H PRN Audery Amel, MD   1 tablet at 09/28/15 2107  . magnesium hydroxide (MILK OF MAGNESIA) suspension 30 mL  30 mL Oral Daily PRN Audery Amel, MD      . ondansetron (ZOFRAN) tablet 4 mg  4 mg Oral Q8H PRN Audery Amel, MD      . pantoprazole (PROTONIX) EC tablet 40 mg  40 mg Oral Daily Audery Amel, MD   40 mg at 09/29/15 9562  . traZODone (DESYREL) tablet 150 mg  150 mg Oral QHS Audery Amel, MD   150 mg at 09/28/15 2107    Lab Results:  No results found for this or any previous visit (from the past 48 hour(s)).  Blood Alcohol level:  Lab Results  Component Value Date   ETH <5 09/25/2015    Metabolic Disorder Labs: Lab Results  Component Value Date   HGBA1C 5.6 09/27/2015   No results found for: PROLACTIN Lab Results  Component Value Date   CHOL 275 (H) 09/27/2015   TRIG 255 (H) 09/27/2015   HDL 48 09/27/2015   CHOLHDL 5.7 09/27/2015   VLDL 51 (H) 09/27/2015   LDLCALC 176 (H) 09/27/2015     Musculoskeletal: Strength & Muscle Tone: within normal limits Gait & Station: normal Patient leans: N/A  Psychiatric Specialty Exam: Physical Exam  Nursing note and vitals reviewed. Constitutional: She appears well-developed and well-nourished.  HENT:  Head: Normocephalic and  atraumatic.  Eyes: Conjunctivae are normal. Pupils are equal, round, and reactive to light.  Neck: Normal range of motion.  Cardiovascular: Regular rhythm and normal heart sounds.   Respiratory: Effort normal. No respiratory distress.  GI: Soft.  Musculoskeletal: Normal range of motion.  Neurological: She is alert.  Skin: Skin is warm and dry.  Psychiatric: Judgment normal. Her mood appears anxious. Her affect is blunt. Her speech is delayed. She is slowed. Cognition and memory are normal. She expresses no suicidal ideation.    Review  of Systems  Constitutional: Negative.  Negative for chills, fever and weight loss.  HENT: Negative.  Negative for congestion, hearing loss, sore throat and tinnitus.   Eyes: Negative.  Negative for blurred vision, double vision and photophobia.  Respiratory: Negative.  Negative for cough, hemoptysis, sputum production and wheezing.   Cardiovascular: Negative for chest pain, palpitations, orthopnea and leg swelling.  Gastrointestinal: Positive for nausea. Negative for heartburn and vomiting.  Genitourinary: Negative.  Negative for dysuria, frequency and urgency.  Musculoskeletal: Negative.  Negative for back pain, falls, joint pain, myalgias and neck pain.  Skin: Negative.  Negative for itching.  Neurological: Negative.  Negative for dizziness, tingling, tremors, sensory change, speech change, focal weakness and headaches.  Endo/Heme/Allergies: Negative.  Does not bruise/bleed easily.  Psychiatric/Behavioral: Negative for depression, hallucinations, memory loss, substance abuse and suicidal ideas. The patient is nervous/anxious. The patient does not have insomnia.     Blood pressure (!) 127/55, pulse (!) 51, temperature 99 F (37.2 C), temperature source Oral, resp. rate 18, height 5\' 4"  (1.626 m), weight 61.2 kg (135 lb), SpO2 99 %.Body mass index is 23.17 kg/m.  General Appearance: Casual  Eye Contact:  Good  Speech:  Clear and Coherent and Normal Rate   Volume:  Normal  Mood:  " A little better today"  Affect:  Depressed  Thought Process:  Coherent, Goal Directed and Linear  Orientation:  Full (Time, Place, and Person)  Thought Content:  Logical  Suicidal Thoughts:  Intermittent passive suicidal thoughts  Homicidal Thoughts:  No  Memory:  Immediate;   Fair Recent;   Fair Remote;   Fair  Judgement:  Good  Insight:  Good  Psychomotor Activity:  Normal  Concentration:  Concentration: Fair and Attention Span: Fair  Recall:  [employer] of Knowledge:  Good  Language:  Good  Akathisia:  No  Handed:  Right  AIMS (if indicated):     Assets:  Communication Skills Desire for Improvement Financial Resources/Insurance Housing Physical Health Social Support Transportation Vocational/Educational  ADL's:  Intact  Cognition:  WNL  Sleep:  Number of Hours: 6     Treatment Plan Summary:   Diagnosis Major depressive disorder, severe, recurrent, without psychotic features Vascular dementia Fibromyalgia Hypertension Osteoarthritis Moderate: Family conflict, widowed   Maria Horton is a 75 year old widowed Caucasian female with a history of recurrent major depression was brought to the emergency room by her daughter secondary to worsening depressive passive suicidal thoughts. She had a plan to shoot herself. She will be admitted to inpatient psychiatry for medication management, safety and stabilization.  Major depressive disorder, recurrent, beer without psychotic features: Patient may be having some nausea related to Celexa but it is not severe. At her suggestion I am adding when necessary doses of Gaviscon. Patient is encouraged to be up out of bed and interactive with peers which she is doing. Continue group therapy and daily individual and group treatment and assessment. Overall improving. Denies any current suicidal thoughts. Still has some realistic worries about discharge planning.  Vascular dementia: The patient does have a  history of multiple TIAs as well as hypertension. At this time, she declined any medications to help with cognition and memory. He denies any recent problems with focus and concentration.  COPD: We'll plan to continue albuterol inhalers. No respiratory distress  Fibromyalgia: We'll plan to continue hydrocodone/acetaminophen 5/325, one tablet every 6 hours as needed. The patient was receiving opiates per the controlled substance database.  GERD: We'll plan to continue Protonix 40  mg by mouth daily  Hypertension: We'll continue hydrochlorothiazide 12.5 mg by mouth daily. Vital signs stable. He is also on aspirin 81 mg by mouth daily.  Disposition: The patient does have a stable living situation. [facility] that would like to new psychiatrist in the area for psychotropic medication management at discharge.   Daily contact with patient to assess and evaluate symptoms and progress in treatment and Medication management  Mordecai RasmussenJohn Clapacs, MD 09/29/2015, 12:20 PM

## 2015-09-30 DIAGNOSIS — G894 Chronic pain syndrome: Secondary | ICD-10-CM | POA: Diagnosis present

## 2015-09-30 DIAGNOSIS — R001 Bradycardia, unspecified: Secondary | ICD-10-CM

## 2015-09-30 MED ORDER — CITALOPRAM HYDROBROMIDE 40 MG PO TABS: 40.0000 mg | ORAL_TABLET | Freq: Every day | ORAL | 1 refills | Status: DC

## 2015-09-30 MED ORDER — CLONAZEPAM 0.5 MG PO TABS: 0.7500 mg | ORAL_TABLET | Freq: Every day | ORAL | 0 refills | Status: DC

## 2015-09-30 MED ORDER — QUETIAPINE FUMARATE 50 MG PO TABS: 50.0000 mg | ORAL_TABLET | Freq: Every day | ORAL | 1 refills | Status: DC

## 2015-09-30 MED ORDER — TRAZODONE HCL 150 MG PO TABS: 150.0000 mg | ORAL_TABLET | Freq: Every day | ORAL | 1 refills | Status: DC

## 2015-09-30 MED ORDER — QUETIAPINE FUMARATE 25 MG PO TABS
50.0000 mg | ORAL_TABLET | Freq: Every day | ORAL | Status: DC
  Administered 2015-09-30: 50 mg via ORAL
  Filled 2015-09-30: qty 2

## 2015-09-30 NOTE — Plan of Care (Signed)
Problem: Activity: Goal: Sleeping patterns will improve Outcome: Not Progressing Continue to have difficulties with sleep.

## 2015-09-30 NOTE — [facility] Group Notes (Signed)
[facility] LCSW Group Therapy   09/30/2015 9:30am  Type of Therapy: Group Therapy   Participation Level: Invited but did not attend. Participation Quality: Invited but did not attend.    Myrel Rappleye, MSW, LCSWA   

## 2015-09-30 NOTE — [facility] Suicide Risk Assessment (Signed)
[facility]   Principal Problem: Severe recurrent major depression without psychotic features [facility](HCC) Discharge Diagnoses:  Patient Active Problem List   Diagnosis Date Noted  . Chronic pain syndrome [G89.4] 09/30/2015  . GERD (gastroesophageal reflux disease) [K21.9] 09/29/2015  . Severe recurrent major depression without psychotic features (HCC) [F33.2] 09/26/2015  . Suicidal ideation [R45.851] 09/26/2015  . Fibromyalgia [M79.7] 09/26/2015  . Osteoarthritis [M19.90] 09/26/2015  . Hypertension [I10] 09/26/2015    Total Time spent with patient: 30 minutes  Musculoskeletal: Strength & Muscle Tone: within normal limits Gait & Station: normal Patient leans: N/A  Psychiatric Specialty Exam: Review of Systems  Psychiatric/Behavioral: The patient is nervous/anxious and has insomnia.   All other systems reviewed and are negative.   Blood pressure 131/60, pulse (!) 50, temperature 98.7 F (37.1 C), temperature source Oral, resp. rate 18, height 5\' 4"  (1.626 m), weight 61.2 kg (135 lb), SpO2 99 %.Body mass index is 23.17 kg/m.  General Appearance: Casual  Eye Contact::  Good  Speech:  Clear and Coherent409  Volume:  Normal  Mood:  Anxious  Affect:  Appropriate  Thought Process:  Goal Directed  Orientation:  Full (Time, Place, and Person)  Thought Content:  WDL  Suicidal Thoughts:  No  Homicidal Thoughts:  No  Memory:  Immediate;   Fair Recent;   Fair Remote;   Fair  Judgement:  Fair  Insight:  Fair  Psychomotor Activity:  Normal  Concentration:  Fair  Recall:  [employer]  Fund of Knowledge:Fair  Language: Fair  Akathisia:  No  Handed:  Right  AIMS (if indicated):     Assets:  Communication Skills Desire for Improvement Financial Resources/Insurance Housing Physical Health Resilience Social Support  Sleep:  Number of Hours: 4.45  Cognition: WNL  ADL's:  Intact   Mental Status Per Nursing Assessment::   On Admission:     Demographic Factors:   Female, Age 75 or older, Divorced or widowed and Caucasian  Loss Factors: Loss of significant relationship, Decline in physical health and Financial problems/change in socioeconomic status  Historical Factors: Family history of mental illness or substance abuse  Risk Reduction Factors:   Sense of responsibility to family, Religious beliefs about death, Living with another person, especially a relative, Positive social support and Positive therapeutic relationship  Continued Clinical Symptoms:  Depression:   Insomnia  Cognitive Features That Contribute To Risk:  None    Suicide Risk:  Minimal: No identifiable suicidal ideation.  Patients presenting with no risk factors but with morbid ruminations; may be classified as minimal risk based on the severity of the depressive symptoms  Follow-up Information    [facility]. Go on 10/08/2015.   Why:  9:00am, for therapy with Kendell Baneroy.  Please call and reschedule if unable to make this appointment. Contact information: [address] [place], [place] [address] [phone] [facility]       [facility]. Go on 10/28/2015.   Why:  9:30am, for medication management with Dr. Renato Gailseed. Please call and reschedule if unable to make appointment. Contact information: [address] [place], [place] [address] [phone] FAX          Plan Of Care/Follow-up recommendations:  Activity:  as tolerated. Diet:  low sodium heart healthy. Other:  keep follow up appointments.  Kristine LineaJolanta Delainey Winstanley, MD 09/30/2015, 7:22 PM

## 2015-09-30 NOTE — [facility] Group Notes (Signed)
Goals Group  Date/Time: 9:00AM Type of Therapy and Topic: Group Therapy: Goals Group: SMART Goals  ?  Participation Level: Moderate  ?  Description of Group:  ?  The purpose of a daily goals group is to assist and guide patients in setting recovery/wellness-related goals. The objective is to set goals as they relate to the crisis in which they were admitted. Patients will be using SMART goal modalities to set measurable goals. Characteristics of realistic goals will be discussed and patients will be assisted in setting and processing how one will reach their goal. Facilitator will also assist patients in applying interventions and coping skills learned in psycho-education groups to the SMART goal and process how one will achieve defined goal.  ?  Therapeutic Goals:  ?  -Patients will develop and document one goal related to or their crisis in which brought them into treatment.  -Patients will be guided by LCSW using SMART goal setting modality in how to set a measurable, attainable, realistic and time sensitive goal.  -Patients will process barriers in reaching goal.  -Patients will process interventions in how to overcome and successful in reaching goal.  ?  Patient's Goal: Patient's goal was to be more optimistic and in order to do so, patient stated that she will cooperate with treatment team. ?  Therapeutic Modalities:  Motivational Interviewing  Cognitive Behavioral Therapy  Crisis Intervention Model  SMART goals setting  Lynden OxfordKadijah R. Melodee Lupe, MSW, LCSW-A 09/30/2015, 10:17AM

## 2015-09-30 NOTE — Progress Notes (Signed)
D:  Patient aware of going home  Tomorrow . Patient stated slept poor last night .Stated appetite is good and energy level  Is normal. Stated concentration is good . Stated on Depression scale 0 , hopeless 0 and anxiety 0 .( low 0-10 high) Denies suicidal  homicidal ideations  .  No auditory hallucinations  No pain concerns . Appropriate ADL'S. Interacting with peers and staff.  A: Encourage patient participation with unit programming . Instruction  Given on  Medication , verbalize understanding. R: Voice no other concerns. Staff continue to monitor

## 2015-09-30 NOTE — Progress Notes (Signed)
[facility] MD Progress Note  09/30/2015 4:00 PM Maria Horton  MRN:  [id number]  Subjective: Ms. Ciaramitaro reports some improvement. She was able to participate in treatment team meeting and engage in discharge planing. She no longer feels acutely suicidal. Her mood is improving, affect is brighter. She started planning for the future. She no longer will stay isolated but rather try to join her peers at the senior center. She is relatively new to [place] and definitely new to [place] where she moved 1 month ago. She complains of constipation but no longer nausea. She seems to tolerate medications well. Good program participation.  Principal Problem: Major Depression and suicidal thoughts  Subjective Past psychiatric history: Depression.  Family psychiatric history: See H&P.  Diagnosis:   Patient Active Problem List   Diagnosis Date Noted  . GERD (gastroesophageal reflux disease) [K21.9] 09/29/2015  . Severe recurrent major depression without psychotic features (HCC) [F33.2] 09/26/2015  . Suicidal ideation [R45.851] 09/26/2015  . Fibromyalgia [M79.7] 09/26/2015  . Osteoarthritis [M19.90] 09/26/2015  . Hypertension [I10] 09/26/2015   Total Time spent with patient: 20 minutes   Past Medical History:  Past Medical History:  Diagnosis Date  . Anxiety   . Chronic back pain   . Constipation   . Depression   . Diverticulosis   . Fibromyalgia   . Gastroparesis   . Hypertension   . Hypoglycemia   . Hypokalemia   . Osteoarthritis   . Reflux   . TIA (transient ischemic attack)   . Vascular dementia   . Vitamin D deficiency     Past Surgical History:  Procedure Laterality Date  . ABDOMINAL HYSTERECTOMY    . CHOLECYSTECTOMY    . COLON SURGERY    . NECK SURGERY    . ROTATOR CUFF REPAIR    . TONSILLECTOMY     * Social History:  History  Alcohol Use No     History  Drug Use No    Social History   Social History  . Marital status: Widowed    Spouse name: N/A  . Number of  children: N/A  . Years of education: N/A   Social History Main Topics  . Smoking status: Never Smoker  . Smokeless tobacco: Never Used  . Alcohol use No  . Drug use: No  . Sexual activity: Not Asked   Other Topics Concern  . None   Social History Narrative  . None    Sleep: Good  Appetite:  Improved  Current Medications: Current Facility-Administered Medications  Medication Dose Route Frequency Provider Last Rate Last Dose  . acetaminophen (TYLENOL) tablet 650 mg  650 mg Oral Q6H PRN Audery Amel, MD      . albuterol (PROVENTIL HFA;VENTOLIN HFA) 108 (90 Base) MCG/ACT inhaler 1 puff  1 puff Inhalation Q6H PRN Audery Amel, MD      . alum & mag hydroxide-simeth (MAALOX/MYLANTA) 200-200-20 MG/5ML suspension 30 mL  30 mL Oral Q4H PRN Audery Amel, MD      . aspirin EC tablet 81 mg  81 mg Oral Daily Audery Amel, MD   81 mg at 09/30/15 5400  . citalopram (CELEXA) tablet 40 mg  40 mg Oral Daily Audery Amel, MD   40 mg at 09/30/15 8676  . clonazePAM (KLONOPIN) tablet 0.75 mg  0.75 mg Oral QHS Darliss Ridgel, MD   0.75 mg at 09/29/15 2108  . fluticasone (FLONASE) 50 MCG/ACT nasal spray 2 spray  2 spray Each Nare  Daily PRN Audery AmelJohn T Clapacs, MD      . hydrochlorothiazide (MICROZIDE) capsule 12.5 mg  12.5 mg Oral Daily Darliss RidgelAarti K Kapur, MD   12.5 mg at 09/30/15 [id number]  . HYDROcodone-acetaminophen (NORCO/VICODIN) 5-325 MG per tablet 1 tablet  1 tablet Oral Q6H PRN Audery AmelJohn T Clapacs, MD   1 tablet at 09/29/15 2113  . magnesium hydroxide (MILK OF MAGNESIA) suspension 30 mL  30 mL Oral Daily PRN Audery AmelJohn T Clapacs, MD   30 mL at 09/30/15 0925  . ondansetron (ZOFRAN) tablet 4 mg  4 mg Oral Q8H PRN Audery AmelJohn T Clapacs, MD      . pantoprazole (PROTONIX) EC tablet 40 mg  40 mg Oral Daily Audery AmelJohn T Clapacs, MD   40 mg at 09/30/15 [id number]  . traZODone (DESYREL) tablet 150 mg  150 mg Oral QHS Audery AmelJohn T Clapacs, MD   150 mg at 09/29/15 2109    Lab Results:  No results found for this or any previous visit (from the  past 48 hour(s)).  Blood Alcohol level:  Lab Results  Component Value Date   ETH <5 09/25/2015    Metabolic Disorder Labs: Lab Results  Component Value Date   HGBA1C 5.6 09/27/2015   No results found for: PROLACTIN Lab Results  Component Value Date   CHOL 275 (H) 09/27/2015   TRIG 255 (H) 09/27/2015   HDL 48 09/27/2015   CHOLHDL 5.7 09/27/2015   VLDL 51 (H) 09/27/2015   LDLCALC 176 (H) 09/27/2015     Musculoskeletal: Strength & Muscle Tone: within normal limits Gait & Station: normal Patient leans: N/A  Psychiatric Specialty Exam: Physical Exam  Nursing note and vitals reviewed. Respiratory: No respiratory distress.  Psychiatric: Judgment normal. Her mood appears anxious. Her affect is blunt. Her speech is delayed. She is slowed. Cognition and memory are normal. She expresses no suicidal ideation.    Review of Systems  Constitutional: Negative for chills, fever and weight loss.  HENT: Negative for congestion, hearing loss, sore throat and tinnitus.   Eyes: Negative for blurred vision, double vision and photophobia.  Respiratory: Negative for cough, hemoptysis, sputum production and wheezing.   Cardiovascular: Negative for chest pain, palpitations, orthopnea and leg swelling.  Gastrointestinal: Negative for heartburn and vomiting.  Genitourinary: Negative for dysuria, frequency and urgency.  Musculoskeletal: Negative for back pain, falls, joint pain, myalgias and neck pain.  Skin: Negative for itching.  Neurological: Negative for dizziness, tingling, tremors, sensory change, speech change, focal weakness and headaches.  Endo/Heme/Allergies: Does not bruise/bleed easily.  Psychiatric/Behavioral: Negative for depression, hallucinations, memory loss, substance abuse and suicidal ideas. The patient is nervous/anxious. The patient does not have insomnia.   All other systems reviewed and are negative.   Blood pressure 131/60, pulse (!) 50, temperature 98.7 F (37.1 C),  temperature source Oral, resp. rate 18, height 5\' 4"  (1.626 m), weight 61.2 kg (135 lb), SpO2 99 %.Body mass index is 23.17 kg/m.  General Appearance: Casual  Eye Contact:  Good  Speech:  Clear and Coherent and Normal Rate  Volume:  Normal  Mood:  Depressed  Affect:  Appropriate  Thought Process:  Coherent, Goal Directed and Linear  Orientation:  Full (Time, Place, and Person)  Thought Content:  Logical  Suicidal Thoughts:  Intermittent passive suicidal thoughts  Homicidal Thoughts:  No  Memory:  Immediate;   Fair Recent;   Fair Remote;   Fair  Judgement:  Good  Insight:  Good  Psychomotor Activity:  Normal  Concentration:  Concentration:  Fair and Attention Span: Fair  Recall:  [employer] of Knowledge:  Good  Language:  Good  Akathisia:  No  Handed:  Right  AIMS (if indicated):     Assets:  Communication Skills Desire for Improvement Financial Resources/Insurance Housing Physical Health Social Support Transportation Vocational/Educational  ADL's:  Intact  Cognition:  WNL  Sleep:  Number of Hours: 4.45     Treatment Plan Summary:  Daily contact with patient to assess and evaluate symptoms and progress in treatment and Medication management   Redler is a 75 year old female with a history of recurrent major depression was brought to the emergency room by her daughter secondary to suicidal thoughts with a plan to shoot herself.   1. Suicidal ideation. The patient is able to contract for safety in the hospital.   2. Mood. She was started on Celexa and her dose was increased to 40 mg.  3. Cognitive decline. The patient has a history of multiple TIAs as well as hypertension. At this time, she declined any medications to help with cognition and memory. He denies any recent problems with focus and concentration but has not been able to play bridge any longer.  4. COPD: We  continued albuterol inhalers.   5. Fibromyalgia: We continued Percocet as prescribed in the  community.   6. GERD: We  continued Protonix.   7. Hypertension: We continued HCTZ and ASA. Vital signs are stable.   8. Insomnia. She is on Trazodone and 0.75 mg of Klonopin at night  Disposition: The patient will be discharged to home. She will continue with her therapist and a [facility] at [facility].     Kristine Linea, MD 09/30/2015, 4:00 PM

## 2015-09-30 NOTE — Tx Team (Signed)
Interdisciplinary Treatment and Diagnostic Plan Update  09/30/2015 Time of Session: 2:00pm Maria Horton MRN: [id number]  Principal Diagnosis: Severe recurrent major depression without psychotic features [facility])  Secondary Diagnoses: Principal Problem:   Severe recurrent major depression without psychotic features (HCC) Active Problems:   Suicidal ideation   Fibromyalgia   Hypertension   GERD (gastroesophageal reflux disease)   Current Medications:  Current Facility-Administered Medications  Medication Dose Route Frequency Provider Last Rate Last Dose  . acetaminophen (TYLENOL) tablet 650 mg  650 mg Oral Q6H PRN Audery Amel, MD      . albuterol (PROVENTIL HFA;VENTOLIN HFA) 108 (90 Base) MCG/ACT inhaler 1 puff  1 puff Inhalation Q6H PRN Audery Amel, MD      . alum & mag hydroxide-simeth (MAALOX/MYLANTA) 200-200-20 MG/5ML suspension 30 mL  30 mL Oral Q4H PRN Audery Amel, MD      . aspirin EC tablet 81 mg  81 mg Oral Daily Audery Amel, MD   81 mg at 09/30/15 4098  . citalopram (CELEXA) tablet 40 mg  40 mg Oral Daily Audery Amel, MD   40 mg at 09/30/15 1191  . clonazePAM (KLONOPIN) tablet 0.75 mg  0.75 mg Oral QHS Darliss Ridgel, MD   0.75 mg at 09/29/15 2108  . fluticasone (FLONASE) 50 MCG/ACT nasal spray 2 spray  2 spray Each Nare Daily PRN Audery Amel, MD      . hydrochlorothiazide (MICROZIDE) capsule 12.5 mg  12.5 mg Oral Daily Darliss Ridgel, MD   12.5 mg at 09/30/15 4782  . HYDROcodone-acetaminophen (NORCO/VICODIN) 5-325 MG per tablet 1 tablet  1 tablet Oral Q6H PRN Audery Amel, MD   1 tablet at 09/29/15 2113  . magnesium hydroxide (MILK OF MAGNESIA) suspension 30 mL  30 mL Oral Daily PRN Audery Amel, MD   30 mL at 09/30/15 0925  . ondansetron (ZOFRAN) tablet 4 mg  4 mg Oral Q8H PRN Audery Amel, MD      . pantoprazole (PROTONIX) EC tablet 40 mg  40 mg Oral Daily Audery Amel, MD   40 mg at 09/30/15 9562  . traZODone (DESYREL) tablet 150 mg  150 mg Oral QHS  Audery Amel, MD   150 mg at 09/29/15 2109   PTA Medications: Prescriptions Prior to Admission  Medication Sig Dispense Refill Last Dose  . albuterol (PROVENTIL HFA;VENTOLIN HFA) 108 (90 Base) MCG/ACT inhaler Inhale 2 puffs into the lungs every 6 (six) hours as needed for wheezing or shortness of breath.   prn  . aspirin EC 81 MG tablet Take 81 mg by mouth daily.     . citalopram (CELEXA) 20 MG tablet Take 20 mg by mouth daily.     . clonazePAM (KLONOPIN) 1 MG tablet Take 1 mg by mouth at bedtime.     Marland Kitchen esomeprazole (NEXIUM) 40 MG capsule Take 40 mg by mouth daily at 12 noon.     . fluticasone (FLONASE) 50 MCG/ACT nasal spray Place 2 sprays into both nostrils daily as needed for allergies or rhinitis.   prn  . HYDROcodone-acetaminophen (NORCO/VICODIN) 5-325 MG tablet Take 1 tablet by mouth every 6 (six) hours as needed for moderate pain.   prn  . ondansetron (ZOFRAN) 4 MG tablet Take 4 mg by mouth every 8 (eight) hours as needed for nausea or vomiting.   prn  . traZODone (DESYREL) 150 MG tablet Take by mouth at bedtime.       Treatment  Modalities: Medication Management, Group therapy, Case management,  1 to 1 session with clinician, Psychoeducation, Recreational therapy.   Physician Treatment Plan for Primary Diagnosis: Severe recurrent major depression without psychotic features (HCC) Long Term Goal(s): Improvement in symptoms so as ready for discharge   Short Term Goals: Ability to identify changes in lifestyle to reduce reccurence of condition will improve, Ability to identify and develop effective coping behaviors will improve, Ability to maintain clinical measurements within normal limits will improve.  Medication Management: Evaluate patient's response, side effects, and tolerance of medication regimen.  Therapeutic Interventions: 1 to 1 sessions, Unit Group sessions and Medication administration.  Evaluation of Outcomes: Progressing  Physician Treatment Plan for Secondary  Diagnosis: Principal Problem:   Severe recurrent major depression without psychotic features (HCC) Active Problems:   Suicidal ideation   Fibromyalgia   Hypertension   GERD (gastroesophageal reflux disease)  Long Term Goal(s): Improvement in symptoms so as ready for discharge  Short Term Goals: Ability to identify changes in lifestyle to reduce recurrence of condition will improve, Ability to identify and develop effective coping behaviors will improve and Ability to maintain clinical measurements within normal limits will improve  Medication Management: Evaluate patient's response, side effects, and tolerance of medication regimen.  Therapeutic Interventions: 1 to 1 sessions, Unit Group sessions and Medication administration.  Evaluation of Outcomes: Progressing   RN Treatment Plan for Primary Diagnosis: Severe recurrent major depression without psychotic features (HCC) Long Term Goal(s): Knowledge of disease and therapeutic regimen to maintain health will improve  Short Term Goals: Ability to participate in decision making will improve, Ability to identify and develop effective coping behaviors will improve and Compliance with prescribed medications will improve  Medication Management: RN will administer medications as ordered by provider, will assess and evaluate patient's response and provide education to patient for prescribed medication. RN will report any adverse and/or side effects to prescribing provider.  Therapeutic Interventions: 1 on 1 counseling sessions, Psychoeducation, Medication administration, Evaluate responses to treatment, Monitor vital signs and CBGs as ordered, Perform/monitor CIWA, COWS, AIMS and Fall Risk screenings as ordered, Perform wound care treatments as ordered.  Evaluation of Outcomes: Progressing   LCSW Treatment Plan for Primary Diagnosis: Severe recurrent major depression without psychotic features (HCC) Long Term Goal(s): Safe transition to  appropriate next level of care at discharge, Engage patient in therapeutic group addressing interpersonal concerns.  Short Term Goals: Engage patient in aftercare planning with referrals and resources, Increase social support, Increase emotional regulation, Identify triggers associated with mental health/substance abuse issues and Increase skills for wellness and recovery  Therapeutic Interventions: Assess for all discharge needs, 1 to 1 time with Social worker, Explore available resources and support systems, Assess for adequacy in community support network, Educate family and significant other(s) on suicide prevention, Complete Psychosocial Assessment, Interpersonal group therapy.  Evaluation of Outcomes: Progressing   Progress in Treatment: Attending groups: No. Participating in groups: No. Taking medication as prescribed: Yes. Toleration medication: Yes. Family/Significant other contact made: Yes, individual(s) contacted:   daughter Patient understands diagnosis: Yes. Discussing patient identified problems/goals with staff: Yes. Medical problems stabilized or resolved: Yes. Denies suicidal/homicidal ideation: Yes. Issues/concerns per patient self-inventory: No. Other:    New problem(s) identified: No, Describe:     New Short Term/Long Term Goal(s):N/A  Discharge Plan or Barriers: None, will go home with family and [facility] and Medication Management  Reason for Continuation of Hospitalization: Depression Medication stabilization Other; describe coordination of after  care planning  Estimated Length of Stay: 1 day  Attendees: Patient:Maria Horton 09/30/2015 5:14 PM  Physician: Kristine Linea 09/30/2015 5:14 PM  Nursing: Hulan Amato, RN 09/30/2015 5:14 PM  RN Care Manager: 09/30/2015 5:14 PM  Social Worker: Jake Shark, LCSW 09/30/2015 5:14 PM  Recreational Therapist:  09/30/2015 5:14 PM  Other:  09/30/2015 5:14 PM  Other:  09/30/2015 5:14 PM   Other: 09/30/2015 5:14 PM    Scribe for Treatment Team: Glennon Mac, LCSW 09/30/2015 5:14 PM

## 2015-10-01 ENCOUNTER — Other Ambulatory Visit: Payer: Self-pay | Admitting: Psychiatry

## 2015-10-01 MED ORDER — QUETIAPINE FUMARATE 50 MG PO TABS: 50.0000 mg | ORAL_TABLET | Freq: Every day | ORAL | 1 refills | Status: DC

## 2015-10-01 MED ORDER — CLONAZEPAM 0.5 MG PO TABS: 0.7500 mg | ORAL_TABLET | Freq: Every day | ORAL | 0 refills | Status: DC

## 2015-10-01 MED ORDER — CITALOPRAM HYDROBROMIDE 40 MG PO TABS: 40.0000 mg | ORAL_TABLET | Freq: Every day | ORAL | 1 refills | Status: DC

## 2015-10-01 MED ORDER — TRAZODONE HCL 150 MG PO TABS: 150.0000 mg | ORAL_TABLET | Freq: Every day | ORAL | 1 refills | Status: DC

## 2015-10-01 NOTE — Discharge Summary (Addendum)
Physician Discharge Summary Note  Patient:  Maria Horton is an 75 y.o., female MRN:  [id number] DOB:  [birth year] Patient phone:  [phone] (home)  Patient address:   [address] [place] [address],  Total Time spent with patient: 30 minutes  Date of Admission:  09/26/2015 Date of Discharge: 10/01/2015  Reason for Admission:  Suicidal ideation.  Ms. Maria Horton is a 75 year old widowed Caucasian female with a history of recurrent major depression was brought to the emergency room by her daughter who lives in [place]. The patient says her depressive symptoms have progressively gotten worse over the past one year. She moved to [place] from [place] approximately 1 year ago and has lived in [place] for several years. She says she does not have a lot of friends here and is very lonely. She initially moved to be closer to her daughter but she and her daughter have been having some conflict. She also reports conflict with her son. Her son lives in [place] and his girlfriend is now living with the patient as she is expecting a baby in 2 weeks. The patient feels like her son has abandoned the girlfriend and the child. She has a very good relationship with her son's girlfriend who lives with her but feels that her biological daughter is jealous of her relationship with his son's girlfriend. She says her daughter is also jealous of her son. The patient had suicidal thoughts with a plan to shoot herself. She does not have a gun in the house and the gun was left at her daughter's house in [place] over 1 year ago. She does report anhedonia, feelings of hopelessness, low energy and frequent crying spells. She has had problems with insomnia but takes trazodone and Klonopin to help with sleep. She has had a 20 pound weight loss and decreased appetite over the past one year. She denies any symptoms consistent with bipolar mania including grandiose delusions, decreased sleep with increased goal-directed behavior,  hyperreligious thoughts, hypersexual behavior. She denies any history of any psychotic symptoms including auditory or visual hallucinations. No paranoid thoughts or delusions. She denies any history of any heavy alcohol use or illicit drug use. The patient was seeing a psychiatrist and a therapist at [facility] in [place]. She likes her therapist there but does not have a good rapport with her psychiatrist.  Past psychiatric history: The patient says she first started taking antidepressants in her 6240s and has tried multiple antidepressants in the past but cannot remember their name. She did the best on Celexa and trazodone. He has a history of 2 prior inpatient psychiatric hospitalization with the last one being approximately 3 years ago. She has had recurrent suicidal ideation but no suicide attempts. The patient was seeing a psychiatrist at [facility] in [place] and was also seeing a therapist there over the past one year. She reports being compliant with medications prior to admission.  Substance abuse history: The patient denies any history of any heavy alcohol use or illicit drug use  Family psychiatric history The patient reports that her son uses drugs and her daughter struggles with depression  Social history The patient previously lived in [place] for many years but moved from [place] to [place] approximately one year ago. She has been married 4 times total and has been widowed since 2000. She has one daughter who lives in [place] and 1 son who lives out of state. [place] her son's friend  who is expecting a child in 2 weeks. The patient has a good relationship with her son's girlfriend but says she has a strained relationship with both her daughter and her son.  Associated Signs/Symptoms: Depression Symptoms:  depressed mood, anhedonia, insomnia, fatigue, recurrent thoughts of death, anxiety, (Hypo) Manic Symptoms:   None Anxiety Symptoms:  Excessive Worry, Psychotic Symptoms:  None PTSD Symptoms: None  Principal Problem: Severe recurrent major depression without psychotic features [name]) Discharge Diagnoses: Patient Active Problem List   Diagnosis Date Noted  . Chronic pain syndrome [G89.4] 09/30/2015  . GERD (gastroesophageal reflux disease) [K21.9] 09/29/2015  . Severe recurrent major depression without psychotic features (HCC) [F33.2] 09/26/2015  . Suicidal ideation [R45.851] 09/26/2015  . Fibromyalgia [M79.7] 09/26/2015  . Osteoarthritis [M19.90] 09/26/2015  . Hypertension [I10] 09/26/2015   Past Medical History:  Past Medical History:  Diagnosis Date  . Anxiety   . Chronic back pain   . Constipation   . Depression   . Diverticulosis   . Fibromyalgia   . Gastroparesis   . Hypertension   . Hypoglycemia   . Hypokalemia   . Osteoarthritis   . Reflux   . TIA (transient ischemic attack)   . Vascular dementia   . Vitamin D deficiency     Past Surgical History:  Procedure Laterality Date  . ABDOMINAL HYSTERECTOMY    . CHOLECYSTECTOMY    . COLON SURGERY    . NECK SURGERY    . ROTATOR CUFF REPAIR    . TONSILLECTOMY     Family History: History reviewed. No pertinent family history.  Social History:  History  Alcohol Use No     History  Drug Use No    Social History   Social History  . Marital status: Widowed    Spouse name: N/A  . Number of children: N/A  . Years of education: N/A   Social History Main Topics  . Smoking status: Never Smoker  . Smokeless tobacco: Never Used  . Alcohol use No  . Drug use: No  . Sexual activity: Not Asked   Other Topics Concern  . None   Social History Narrative  . None    Hospital Course:    Batte is a 75 year old female with a history of recurrent major depression was brought to the emergency room by her daughter secondary to suicidal thoughts with a plan to shoot herself.   1. Suicidal ideation. This has resolved. The  patient is able to contract for safety. She is forward thinking and optimistic about the future.    2. Mood. She was continued on Celexa and her dose was increased to 40 mg. Seroquel was added for depression and mood stabilization.  3. Cognitive decline. The patient has a history of multiple TIAs as well as hypertension. At this time, she declined any medications to help with cognition and memory. He denies any recent problems with focus and concentration but has not been able to play bridge any longer.  4. COPD: We  continued albuterol inhalers.   5. Fibromyalgia: We continued Percocet as prescribed in the community.   6. GERD: We  continued Protonix.   7. Hypertension: We continued HCTZ and ASA. Vital signs are stable.   8. Insomnia. She slept only 4 hours with Trazodone and 0.75 mg of Klonopin at night. We added Seroquel.   9. Metabolic syndrome monitoring. Lipid panel is elevated, HgbA1C and TSH are normal.  10. EKG. Asymptomatic sinus bradycardia. QT 496.  11.   Disposition:  The patient was discharged to home. She will continue with her therapist and a psychiatrist at [facility].   Physical Findings: AIMS:  , ,  ,  ,    CIWA:    COWS:     Musculoskeletal: Strength & Muscle Tone: within normal limits Gait & Station: normal Patient leans: N/A  Psychiatric Specialty Exam: Physical Exam  Nursing note and vitals reviewed.   Review of Systems  Musculoskeletal: Positive for joint pain and myalgias.  All other systems reviewed and are negative.   Blood pressure (!) 134/53, pulse (!) 46, temperature 98.3 F (36.8 C), temperature source Oral, resp. rate 18, height 5\' 4"  (1.626 m), weight 61.2 kg (135 lb), SpO2 99 %.Body mass index is 23.17 kg/m.  See SRA.                                                  Sleep:  Number of Hours: 7.25     Have you used any form of tobacco in the last 30 days? (Cigarettes, Smokeless Tobacco, Cigars,  and/or Pipes): No  Has this patient used any form of tobacco in the last 30 days? (Cigarettes, Smokeless Tobacco, Cigars, and/or Pipes) Yes, No  Blood Alcohol level:  Lab Results  Component Value Date   ETH <5 09/25/2015    Metabolic Disorder Labs:  Lab Results  Component Value Date   HGBA1C 5.6 09/27/2015   No results found for: PROLACTIN Lab Results  Component Value Date   CHOL 275 (H) 09/27/2015   TRIG 255 (H) 09/27/2015   HDL 48 09/27/2015   CHOLHDL 5.7 09/27/2015   VLDL 51 (H) 09/27/2015   LDLCALC 176 (H) 09/27/2015    See Psychiatric Specialty Exam and Suicide Risk Assessment completed by Attending Physician prior to discharge.  Discharge destination:  Home  Is patient on multiple antipsychotic therapies at discharge:  No   Has Patient had three or more failed trials of antipsychotic monotherapy by history:  No  Recommended Plan for Multiple Antipsychotic Therapies: NA  Discharge Instructions    Diet - low sodium heart healthy    Complete by:  As directed   Increase activity slowly    Complete by:  As directed       Medication List    TAKE these medications     Indication  albuterol 108 (90 Base) MCG/ACT inhaler Commonly known as:  PROVENTIL HFA;VENTOLIN HFA Inhale 2 puffs into the lungs every 6 (six) hours as needed for wheezing or shortness of breath.  Indication:  Chronic Obstructive Lung Disease   aspirin EC 81 MG tablet Take 81 mg by mouth daily.  Indication:  Inflammation, Temporary Stroke   citalopram 40 MG tablet Commonly known as:  CELEXA Take 1 tablet (40 mg total) by mouth daily. What changed:  medication strength  how much to take  Indication:  Depression   clonazePAM 0.5 MG tablet Commonly known as:  KLONOPIN Take 1.5 tablets (0.75 mg total) by mouth at bedtime. What changed:  medication strength  how much to take  Indication:  Panic Disorder   esomeprazole 40 MG capsule Commonly known as:  NEXIUM Take 40 mg by mouth daily  at 12 noon.  Indication:  Gastroesophageal Reflux Disease with Current Symptoms   fluticasone 50 MCG/ACT nasal spray Commonly known as:  FLONASE Place 2 sprays into both  nostrils daily as needed for allergies or rhinitis.  Indication:  Signs and Symptoms of Nose Diseases   HYDROcodone-acetaminophen 5-325 MG tablet Commonly known as:  NORCO/VICODIN Take 1 tablet by mouth every 6 (six) hours as needed for moderate pain.  Indication:  Moderate to Moderately Severe Pain   ondansetron 4 MG tablet Commonly known as:  ZOFRAN Take 4 mg by mouth every 8 (eight) hours as needed for nausea or vomiting.  Indication:  Cholestatic Pruritus   QUEtiapine 50 MG tablet Commonly known as:  SEROQUEL Take 1 tablet (50 mg total) by mouth at bedtime.  Indication:  Depressive Phase of Manic-Depression   traZODone 150 MG tablet Commonly known as:  DESYREL Take 1 tablet (150 mg total) by mouth at bedtime. What changed:  how much to take  Indication:  Trouble Sleeping      Follow-up Information    [facility]. Go on 10/08/2015.   Why:  9:00am, for therapy with Kendell Bane.  Please call and reschedule if unable to make this appointment. Contact information: [address] [place], [place] [address] [phone] [facility]. Go on 10/28/2015.   Why:  9:30am, for medication management with Dr. Renato Gails. Please call and reschedule if unable to make appointment. Contact information: [address] [place], [place] [address] [phone] FAX          Follow-up recommendations:  Activity:  as tolerated. Diet:  low sodium heart healthy.  Other:  keep follow up appointments.  Comments:    Signed: Kristine Linea, MD 10/01/2015, 9:05 AM

## 2015-10-01 NOTE — Progress Notes (Signed)
  [facility] :  Will you be returning to the same living situation after discharge:  Yes,  patient will be returning home. At discharge, do you have transportation home?: Yes,  daughter Do you have the ability to pay for your medications: Yes,  Patient has insurance.  Release of information consent forms completed and in the chart;  Patient's signature needed at discharge.  Patient to Follow up at: Follow-up Information    [facility]. Go on 10/08/2015.   Why:  9:00am, for therapy with Kendell Baneroy.  Please call and reschedule if unable to make this appointment. Contact information: [address] [place], [place] [address] [phone] [facility]       [facility]. Go on 10/28/2015.   Why:  9:30am, for medication management with Dr. Renato Gailseed. Please call and reschedule if unable to make appointment. Contact information: [address] [place], [place] [address] [phone] FAX          Next level of care provider has access to [facility] Link:no  Safety Planning and Suicide Prevention discussed: Yes,  with daughter and patient  Have you used any form of tobacco in the last 30 days? (Cigarettes, Smokeless Tobacco, Cigars, and/or Pipes): No  Has patient been referred to the Quitline?: N/A patient is not a smoker  Patient has been referred for addiction treatment: N/A  Nyja Westbrook G. Garnette CzechSampson MSW, LCSWA 10/01/2015, 10:20 AM

## 2015-10-01 NOTE — Plan of Care (Signed)
Problem: Medication: Goal: Compliance with prescribed medication regimen will improve Outcome: Progressing Pt compliant with medications, and knowledgeable in regards to regimen.

## 2015-10-01 NOTE — Progress Notes (Signed)
D:Patient aware of discharge this shift . Patient returning home . Patient received all belonging locked up . Patient denies  Suicidal  And homicidal ideations  .  A: Writer instructed on discharge criteria  . Informed of Discharge Summary Transitional Record Suicidal Risk Assessment  and prescriptions  given to patient  . Aware  Of follow up appointment . R: Patient left unit with no questions  Or concerns  With daughter.

## 2015-10-01 NOTE — Progress Notes (Signed)
Recreation Therapy Notes  Date: 09.06.17 Time: 1:00 pm Location: Craft Room  Group Topic: Self-esteem  Goal Area(s) Addresses:  Patient will write at least one positive trait about self. Patient will verbalize benefit of having a healthy self-esteem.  Behavioral Response: Did not attend  Intervention: I Am  Activity: Patients were given worksheets with the letter I on it and instructed to write as many positive traits inside the letter.  Education: LRT educated patients on ways they can increase their self-esteem.  Education Outcome: Patient did not attend group.  Clinical Observations/Feedback: Patient did not attend group.  Jacquelynn CreeGreene,Blondina Coderre M, LRT/CTRS 10/01/2015 2:39 PM

## 2015-10-01 NOTE — [facility] Group Notes (Signed)
[facility] Group Notes:  (Nursing/MHT/Case Management/Adjunct)  Date:  10/01/2015  Time:  3:52 PM  Type of Therapy:  Psychoeducational Skills  Participation Level:  Active  Participation Quality:  Appropriate, Attentive and Sharing  Affect:  Appropriate  Cognitive:  Alert and Appropriate  Insight:  Appropriate  Engagement in Group:  Engaged  Modes of Intervention:  Discussion, Education and Support  Summary of Progress/Problems:  Maria Horton 10/01/2015, 3:52 PM

## 2015-10-01 NOTE — Tx Team (Signed)
Interdisciplinary Treatment and Diagnostic Plan Update  10/01/2015 Time of Session: 10:20am Maria CoronaMary L Horton MRN: [id number]  Principal Diagnosis: Severe recurrent major depression without psychotic features [facility](HCC)  Secondary Diagnoses: Principal Problem:   Severe recurrent major depression without psychotic features (HCC) Active Problems:   Suicidal ideation   Fibromyalgia   Hypertension   GERD (gastroesophageal reflux disease)   Chronic pain syndrome   Current Medications:  Current Facility-Administered Medications  Medication Dose Route Frequency Provider Last Rate Last Dose  . acetaminophen (TYLENOL) tablet 650 mg  650 mg Oral Q6H PRN Audery AmelJohn T Clapacs, MD      . albuterol (PROVENTIL HFA;VENTOLIN HFA) 108 (90 Base) MCG/ACT inhaler 1 puff  1 puff Inhalation Q6H PRN Audery AmelJohn T Clapacs, MD      . alum & mag hydroxide-simeth (MAALOX/MYLANTA) 200-200-20 MG/5ML suspension 30 mL  30 mL Oral Q4H PRN Audery AmelJohn T Clapacs, MD      . aspirin EC tablet 81 mg  81 mg Oral Daily Audery AmelJohn T Clapacs, MD   81 mg at 10/01/15 0758  . citalopram (CELEXA) tablet 40 mg  40 mg Oral Daily Audery AmelJohn T Clapacs, MD   40 mg at 10/01/15 0758  . clonazePAM (KLONOPIN) tablet 0.75 mg  0.75 mg Oral QHS Darliss RidgelAarti K Kapur, MD   0.75 mg at 09/30/15 2200  . fluticasone (FLONASE) 50 MCG/ACT nasal spray 2 spray  2 spray Each Nare Daily PRN Audery AmelJohn T Clapacs, MD      . hydrochlorothiazide (MICROZIDE) capsule 12.5 mg  12.5 mg Oral Daily Darliss RidgelAarti K Kapur, MD   12.5 mg at 10/01/15 0758  . HYDROcodone-acetaminophen (NORCO/VICODIN) 5-325 MG per tablet 1 tablet  1 tablet Oral Q6H PRN Audery AmelJohn T Clapacs, MD   1 tablet at 09/30/15 2200  . magnesium hydroxide (MILK OF MAGNESIA) suspension 30 mL  30 mL Oral Daily PRN Audery AmelJohn T Clapacs, MD   30 mL at 09/30/15 0925  . ondansetron (ZOFRAN) tablet 4 mg  4 mg Oral Q8H PRN Audery AmelJohn T Clapacs, MD      . pantoprazole (PROTONIX) EC tablet 40 mg  40 mg Oral Daily Audery AmelJohn T Clapacs, MD   40 mg at 10/01/15 0758  . QUEtiapine (SEROQUEL)  tablet 50 mg  50 mg Oral QHS Shari ProwsJolanta B Pucilowska, MD   50 mg at 09/30/15 2155  . traZODone (DESYREL) tablet 150 mg  150 mg Oral QHS Audery AmelJohn T Clapacs, MD   150 mg at 09/30/15 2155   PTA Medications: Prescriptions Prior to Admission  Medication Sig Dispense Refill Last Dose  . albuterol (PROVENTIL HFA;VENTOLIN HFA) 108 (90 Base) MCG/ACT inhaler Inhale 2 puffs into the lungs every 6 (six) hours as needed for wheezing or shortness of breath.   prn  . aspirin EC 81 MG tablet Take 81 mg by mouth daily.     . citalopram (CELEXA) 20 MG tablet Take 20 mg by mouth daily.     . clonazePAM (KLONOPIN) 1 MG tablet Take 1 mg by mouth at bedtime.     Marland Kitchen. esomeprazole (NEXIUM) 40 MG capsule Take 40 mg by mouth daily at 12 noon.     . fluticasone (FLONASE) 50 MCG/ACT nasal spray Place 2 sprays into both nostrils daily as needed for allergies or rhinitis.   prn  . HYDROcodone-acetaminophen (NORCO/VICODIN) 5-325 MG tablet Take 1 tablet by mouth every 6 (six) hours as needed for moderate pain.   prn  . ondansetron (ZOFRAN) 4 MG tablet Take 4 mg by mouth every 8 (eight) hours  as needed for nausea or vomiting.   prn  . [DISCONTINUED] traZODone (DESYREL) 150 MG tablet Take by mouth at bedtime.       Treatment Modalities: Medication Management, Group therapy, Case management,  1 to 1 session with clinician, Psychoeducation, Recreational therapy.   Physician Treatment Plan for Primary Diagnosis: Severe recurrent major depression without psychotic features (HCC) Long Term Goal(s): Improvement in symptoms so as ready for discharge   Short Term Goals: Ability to identify changes in lifestyle to reduce recurrence of condition will improve, Ability to identify and develop effective coping behaviors will improve and Ability to maintain clinical measurements within normal limits will improve  Medication Management: Evaluate patient's response, side effects, and tolerance of medication regimen.  Therapeutic Interventions: 1  to 1 sessions, Unit Group sessions and Medication administration.  Evaluation of Outcomes: Adequate for Discharge  Physician Treatment Plan for Secondary Diagnosis: Principal Problem:   Severe recurrent major depression without psychotic features (HCC) Active Problems:   Suicidal ideation   Fibromyalgia   Hypertension   GERD (gastroesophageal reflux disease)   Chronic pain syndrome  Long Term Goal(s): Improvement in symptoms so as ready for discharge  Short Term Goals: Ability to identify changes in lifestyle to reduce recurrence of condition will improve, Ability to identify and develop effective coping behaviors will improve and Ability to maintain clinical measurements within normal limits will improve  Medication Management: Evaluate patient's response, side effects, and tolerance of medication regimen.  Therapeutic Interventions: 1 to 1 sessions, Unit Group sessions and Medication administration.  Evaluation of Outcomes: Adequate for Discharge   RN Treatment Plan for Primary Diagnosis: Severe recurrent major depression without psychotic features (HCC) Long Term Goal(s): Knowledge of disease and therapeutic regimen to maintain health will improve  Short Term Goals: Ability to participate in decision making will improve, Ability to identify and develop effective coping behaviors will improve and Compliance with prescribed medications will improve  Medication Management: RN will administer medications as ordered by provider, will assess and evaluate patient's response and provide education to patient for prescribed medication. RN will report any adverse and/or side effects to prescribing provider.  Therapeutic Interventions: 1 on 1 counseling sessions, Psychoeducation, Medication administration, Evaluate responses to treatment, Monitor vital signs and CBGs as ordered, Perform/monitor CIWA, COWS, AIMS and Fall Risk screenings as ordered, Perform wound care treatments as  ordered.  Evaluation of Outcomes: Adequate for Discharge   LCSW Treatment Plan for Primary Diagnosis: Severe recurrent major depression without psychotic features (HCC) Long Term Goal(s): Safe transition to appropriate next level of care at discharge, Engage patient in therapeutic group addressing interpersonal concerns.  Short Term Goals: Engage patient in aftercare planning with referrals and resources, Increase social support, Increase emotional regulation, Identify triggers associated with mental health/substance abuse issues and Increase skills for wellness and recovery  Therapeutic Interventions: Assess for all discharge needs, 1 to 1 time with Social worker, Explore available resources and support systems, Assess for adequacy in community support network, Educate family and significant other(s) on suicide prevention, Complete Psychosocial Assessment, Interpersonal group therapy.  Evaluation of Outcomes: Adequate for Discharge   Progress in Treatment: Attending groups: Yes. Participating in groups: Yes. Taking medication as prescribed: Yes. Toleration medication: Yes. Family/Significant other contact made: Yes, individual(s) contacted:  daughter Patient understands diagnosis: Yes. Discussing patient identified problems/goals with staff: Yes. Medical problems stabilized or resolved: Yes. Denies suicidal/homicidal ideation: Yes. Issues/concerns per patient self-inventory: No. Other: n/a  New problem(s) identified: None identified at this time.  New Short Term/Long Term Goal(s): None identified at this time.   Discharge Plan or Barriers: Patient will be discharging back home and has medication management and outpatient therapy follow-up with [facility].   Reason for Continuation of Hospitalization: Patient is discharging 10/01/2015  Estimated Length of Stay: Discharging 10/01/2015  Attendees: Patient:Maria Horton 10/01/2015 10:21 AM  Physician: Dr.  Jennet Maduro, MD 10/01/2015 10:21 AM  Nursing: Elenore Paddy, RN 10/01/2015 10:21 AM  RN Care Manager: 10/01/2015 10:21 AM  Social Worker: Fredrich Birks. Garnette Czech MSW, LCSWA 10/01/2015 10:21 AM  Recreational Therapist: Jacquelynn Cree, LRT/CTRS 10/01/2015 10:21 AM  Other:  10/01/2015 10:21 AM  Other:  10/01/2015 10:21 AM  Other: 10/01/2015 10:21 AM    Scribe for Treatment Team: Fredrich Birks. Garnette Czech MSW, [facility] 10/01/2015 10:27 AM

## 2015-10-01 NOTE — [facility] Group Notes (Signed)
[facility] LCSW Group Therapy   10/01/2015  9:30 am   Type of Therapy: Group Therapy   Participation Level: Invited but did not attend.  Participation Quality: Invited but did not attend.   Jarelle Ates, MSW, LCSWA  

## 2015-10-01 NOTE — Progress Notes (Signed)
D: Observed pt in day room interacting with peers. Patient alert and oriented x4. Patient denies SI/HI/AVH. Pt affect is appropriate. Pt stated her day was "good." Pt rated depression 0/10 and anxiety 0/10.  Pt seen appropriately providing support to peers. Pt had questions about Seroquel prescription. Pt had no complaints. A: Offered active listening and support. Provided therapeutic communication. Administered scheduled medications. Educated pt on Seroquel prescription. Educated pt on fall safety. R: Pt pleasant and cooperative. Pt medication compliant. Will continue Q15 min. checks. Safety maintained.

## 2015-10-01 NOTE — [facility] Suicide Risk Assessment (Signed)
[facility]   Principal Problem: Severe recurrent major depression without psychotic features [facility](HCC) Discharge Diagnoses:  Patient Active Problem List   Diagnosis Date Noted  . Chronic pain syndrome [G89.4] 09/30/2015  . GERD (gastroesophageal reflux disease) [K21.9] 09/29/2015  . Severe recurrent major depression without psychotic features (HCC) [F33.2] 09/26/2015  . Suicidal ideation [R45.851] 09/26/2015  . Fibromyalgia [M79.7] 09/26/2015  . Osteoarthritis [M19.90] 09/26/2015  . Hypertension [I10] 09/26/2015    Total Time spent with patient: 30 minutes  Musculoskeletal: Strength & Muscle Tone: within normal limits Gait & Station: normal Patient leans: N/A  Psychiatric Specialty Exam: Review of Systems  Psychiatric/Behavioral: The patient is nervous/anxious and has insomnia.   All other systems reviewed and are negative.   Blood pressure (!) 134/53, pulse (!) 46, temperature 98.3 F (36.8 C), temperature source Oral, resp. rate 18, height 5\' 4"  (1.626 m), weight 61.2 kg (135 lb), SpO2 99 %.Body mass index is 23.17 kg/m.  General Appearance: Casual  Eye Contact::  Good  Speech:  Clear and Coherent409  Volume:  Normal  Mood:  Anxious  Affect:  Appropriate  Thought Process:  Goal Directed  Orientation:  Full (Time, Place, and Person)  Thought Content:  WDL  Suicidal Thoughts:  No  Homicidal Thoughts:  No  Memory:  Immediate;   Fair Recent;   Fair Remote;   Fair  Judgement:  Fair  Insight:  Fair  Psychomotor Activity:  Normal  Concentration:  Fair  Recall:  F[employer]Fair  Fund of Knowledge:Fair  Language: Fair  Akathisia:  No  Handed:  Right  AIMS (if indicated):     Assets:  Communication Skills Desire for Improvement Financial Resources/Insurance Housing Physical Health Resilience Social Support  Sleep:  Number of Hours: 7.25  Cognition: WNL  ADL's:  Intact   Mental Status Per Nursing Assessment::   On Admission:     Demographic  Factors:  Female, Age 75 or older, Divorced or widowed and Caucasian  Loss Factors: Loss of significant relationship, Decline in physical health and Financial problems/change in socioeconomic status  Historical Factors: Family history of mental illness or substance abuse  Risk Reduction Factors:   Sense of responsibility to family, Religious beliefs about death, Living with another person, especially a relative, Positive social support and Positive therapeutic relationship  Continued Clinical Symptoms:  Depression:   Insomnia  Cognitive Features That Contribute To Risk:  None    Suicide Risk:  Minimal: No identifiable suicidal ideation.  Patients presenting with no risk factors but with morbid ruminations; may be classified as minimal risk based on the severity of the depressive symptoms  Follow-up Information    [facility]. Go on 10/08/2015.   Why:  9:00am, for therapy with Kendell Baneroy.  Please call and reschedule if unable to make this appointment. Contact information: [address] [place], [place] [address] [phone] [facility]       [facility]. Go on 10/28/2015.   Why:  9:30am, for medication management with Dr. Renato Gailseed. Please call and reschedule if unable to make appointment. Contact information: [address] [place], [place] [address] [phone] FAX          Plan Of Care/Follow-up recommendations:  Activity:  as tolerated. Diet:  low sodium heart healthy. Other:  keep follow up appointments.  Kristine LineaJolanta Krisa Blattner, MD 10/01/2015, 9:05 AM

## 2016-09-10 ENCOUNTER — Observation Stay: Payer: Medicare Other

## 2016-09-10 ENCOUNTER — Observation Stay
Admission: EM | Admit: 2016-09-10 | Discharge: 2016-09-11 | Disposition: A | Discharge status: Home / Self Care | Payer: Medicare Other | Attending: Internal Medicine | Admitting: Internal Medicine

## 2016-09-10 ENCOUNTER — Encounter: Payer: Self-pay | Admitting: Internal Medicine

## 2016-09-10 ENCOUNTER — Emergency Department: Payer: Medicare Other

## 2016-09-10 DIAGNOSIS — Z888 Allergy status to other drugs, medicaments and biological substances status: Secondary | ICD-10-CM | POA: Diagnosis not present

## 2016-09-10 DIAGNOSIS — R4701 Aphasia: Secondary | ICD-10-CM

## 2016-09-10 DIAGNOSIS — Z91048 Other nonmedicinal substance allergy status: Secondary | ICD-10-CM | POA: Insufficient documentation

## 2016-09-10 DIAGNOSIS — F0390 Unspecified dementia without behavioral disturbance: Secondary | ICD-10-CM | POA: Insufficient documentation

## 2016-09-10 DIAGNOSIS — K5900 Constipation, unspecified: Secondary | ICD-10-CM | POA: Diagnosis not present

## 2016-09-10 DIAGNOSIS — F419 Anxiety disorder, unspecified: Secondary | ICD-10-CM | POA: Diagnosis not present

## 2016-09-10 DIAGNOSIS — Z7902 Long term (current) use of antithrombotics/antiplatelets: Secondary | ICD-10-CM | POA: Insufficient documentation

## 2016-09-10 DIAGNOSIS — K3184 Gastroparesis: Secondary | ICD-10-CM | POA: Insufficient documentation

## 2016-09-10 DIAGNOSIS — F329 Major depressive disorder, single episode, unspecified: Secondary | ICD-10-CM | POA: Diagnosis not present

## 2016-09-10 DIAGNOSIS — K219 Gastro-esophageal reflux disease without esophagitis: Secondary | ICD-10-CM | POA: Insufficient documentation

## 2016-09-10 DIAGNOSIS — M797 Fibromyalgia: Secondary | ICD-10-CM | POA: Insufficient documentation

## 2016-09-10 DIAGNOSIS — Z79899 Other long term (current) drug therapy: Secondary | ICD-10-CM | POA: Insufficient documentation

## 2016-09-10 DIAGNOSIS — E785 Hyperlipidemia, unspecified: Secondary | ICD-10-CM | POA: Insufficient documentation

## 2016-09-10 DIAGNOSIS — M549 Dorsalgia, unspecified: Secondary | ICD-10-CM | POA: Insufficient documentation

## 2016-09-10 DIAGNOSIS — I10 Essential (primary) hypertension: Secondary | ICD-10-CM | POA: Diagnosis not present

## 2016-09-10 DIAGNOSIS — M1990 Unspecified osteoarthritis, unspecified site: Secondary | ICD-10-CM | POA: Diagnosis not present

## 2016-09-10 DIAGNOSIS — E559 Vitamin D deficiency, unspecified: Secondary | ICD-10-CM | POA: Insufficient documentation

## 2016-09-10 DIAGNOSIS — Z7982 Long term (current) use of aspirin: Secondary | ICD-10-CM | POA: Diagnosis not present

## 2016-09-10 DIAGNOSIS — G894 Chronic pain syndrome: Secondary | ICD-10-CM | POA: Diagnosis not present

## 2016-09-10 DIAGNOSIS — G459 Transient cerebral ischemic attack, unspecified: Secondary | ICD-10-CM | POA: Diagnosis present

## 2016-09-10 DIAGNOSIS — I639 Cerebral infarction, unspecified: Secondary | ICD-10-CM

## 2016-09-10 LAB — CBC
HCT: 42.6 % (ref 35.0–47.0)
Hemoglobin: 14.2 g/dL (ref 12.0–16.0)
MCH: 31.6 pg (ref 26.0–34.0)
MCHC: 33.4 g/dL (ref 32.0–36.0)
MCV: 94.4 fL (ref 80.0–100.0)
PLATELETS: 164 10*3/uL (ref 150–440)
RBC: 4.51 MIL/uL (ref 3.80–5.20)
RDW: 14.7 % — ABNORMAL HIGH (ref 11.5–14.5)
WBC: 6.1 10*3/uL (ref 3.6–11.0)

## 2016-09-10 LAB — DIFFERENTIAL
Basophils Absolute: 0 10*3/uL (ref 0–0.1)
Basophils Relative: 0 %
EOS ABS: 0 10*3/uL (ref 0–0.7)
EOS PCT: 1 %
LYMPHS ABS: 1.6 10*3/uL (ref 1.0–3.6)
Lymphocytes Relative: 26 %
MONO ABS: 0.4 10*3/uL (ref 0.2–0.9)
Monocytes Relative: 7 %
NEUTROS PCT: 66 %
Neutro Abs: 4.1 10*3/uL (ref 1.4–6.5)

## 2016-09-10 LAB — COMPREHENSIVE METABOLIC PANEL
ALBUMIN: 3.8 g/dL (ref 3.5–5.0)
ALK PHOS: 49 U/L (ref 38–126)
ALT: 12 U/L — ABNORMAL LOW (ref 14–54)
ANION GAP: 10 (ref 5–15)
AST: 22 U/L (ref 15–41)
BILIRUBIN TOTAL: 0.7 mg/dL (ref 0.3–1.2)
BUN: 15 mg/dL (ref 6–20)
CALCIUM: 9.5 mg/dL (ref 8.9–10.3)
CO2: 33 mmol/L — ABNORMAL HIGH (ref 22–32)
Chloride: 99 mmol/L — ABNORMAL LOW (ref 101–111)
Creatinine, Ser: 1.03 mg/dL — ABNORMAL HIGH (ref 0.44–1.00)
GFR calc non Af Amer: 52 mL/min — ABNORMAL LOW (ref >60)
Glucose, Bld: 156 mg/dL — ABNORMAL HIGH (ref 65–99)
POTASSIUM: 3 mmol/L — AB (ref 3.5–5.1)
SODIUM: 142 mmol/L (ref 135–145)
TOTAL PROTEIN: 6.9 g/dL (ref 6.5–8.1)

## 2016-09-10 LAB — URINALYSIS, ROUTINE W REFLEX MICROSCOPIC
Bilirubin Urine: NEGATIVE
GLUCOSE, UA: NEGATIVE mg/dL
Hgb urine dipstick: NEGATIVE
KETONES UR: NEGATIVE mg/dL
LEUKOCYTES UA: NEGATIVE
NITRITE: NEGATIVE
PROTEIN: NEGATIVE mg/dL
Specific Gravity, Urine: >1.046 — ABNORMAL HIGH (ref 1.005–1.030)
pH: 7 (ref 5.0–8.0)

## 2016-09-10 LAB — PROTIME-INR
INR: 0.93
PROTHROMBIN TIME: 12.5 s (ref 11.4–15.2)

## 2016-09-10 LAB — URINE DRUG SCREEN, QUALITATIVE (ARMC ONLY)
AMPHETAMINES, UR SCREEN: NOT DETECTED
BARBITURATES, UR SCREEN: NOT DETECTED
BENZODIAZEPINE, UR SCRN: NOT DETECTED
Cannabinoid 50 Ng, Ur ~~LOC~~: NOT DETECTED
Cocaine Metabolite,Ur ~~LOC~~: NOT DETECTED
MDMA (Ecstasy)Ur Screen: NOT DETECTED
METHADONE SCREEN, URINE: NOT DETECTED
Opiate, Ur Screen: POSITIVE — AB
Phencyclidine (PCP) Ur S: NOT DETECTED
TRICYCLIC, UR SCREEN: NOT DETECTED

## 2016-09-10 LAB — TROPONIN I

## 2016-09-10 LAB — ETHANOL

## 2016-09-10 LAB — APTT: aPTT: 28 seconds (ref 24–36)

## 2016-09-10 MED ORDER — FLUTICASONE PROPIONATE 50 MCG/ACT NA SUSP
2.0000 | Freq: Every day | NASAL | Status: DC | PRN
  Filled 2016-09-10: qty 16

## 2016-09-10 MED ORDER — ACETAMINOPHEN 650 MG RE SUPP: 650.0000 mg | RECTAL | Status: DC | PRN

## 2016-09-10 MED ORDER — STROKE: EARLY STAGES OF RECOVERY BOOK
Freq: Once | Status: AC
  Administered 2016-09-10: 17:00:00

## 2016-09-10 MED ORDER — HYDROCODONE-ACETAMINOPHEN 5-325 MG PO TABS: 1.0000 | ORAL_TABLET | Freq: Three times a day (TID) | ORAL | Status: DC | PRN

## 2016-09-10 MED ORDER — DULOXETINE HCL 20 MG PO CPEP
40.0000 mg | ORAL_CAPSULE | Freq: Every day | ORAL | Status: DC
  Administered 2016-09-11: 40 mg via ORAL
  Filled 2016-09-10: qty 2

## 2016-09-10 MED ORDER — ASPIRIN EC 81 MG PO TBEC
81.0000 mg | DELAYED_RELEASE_TABLET | Freq: Every day | ORAL | Status: DC
  Administered 2016-09-11: 81 mg via ORAL
  Filled 2016-09-10: qty 1

## 2016-09-10 MED ORDER — TRAZODONE HCL 50 MG PO TABS
150.0000 mg | ORAL_TABLET | Freq: Every day | ORAL | Status: DC
  Administered 2016-09-10: 150 mg via ORAL
  Filled 2016-09-10: qty 3

## 2016-09-10 MED ORDER — ENOXAPARIN SODIUM 40 MG/0.4ML ~~LOC~~ SOLN
40.0000 mg | SUBCUTANEOUS | Status: DC
  Filled 2016-09-10: qty 0.4

## 2016-09-10 MED ORDER — TRIAMTERENE-HCTZ 37.5-25 MG PO CAPS
1.0000 | ORAL_CAPSULE | Freq: Every day | ORAL | Status: DC
  Filled 2016-09-10: qty 1

## 2016-09-10 MED ORDER — PRAVASTATIN SODIUM 20 MG PO TABS
10.0000 mg | ORAL_TABLET | Freq: Every day | ORAL | Status: DC
  Administered 2016-09-11: 09:00:00 10 mg via ORAL
  Filled 2016-09-10: qty 1

## 2016-09-10 MED ORDER — POTASSIUM CHLORIDE CRYS ER 20 MEQ PO TBCR
40.0000 meq | EXTENDED_RELEASE_TABLET | Freq: Once | ORAL | Status: AC
  Administered 2016-09-10: 17:00:00 40 meq via ORAL
  Filled 2016-09-10: qty 2

## 2016-09-10 MED ORDER — ACETAMINOPHEN 160 MG/5ML PO SOLN
650.0000 mg | ORAL | Status: DC | PRN
  Filled 2016-09-10: qty 20.3

## 2016-09-10 MED ORDER — ALBUTEROL SULFATE (2.5 MG/3ML) 0.083% IN NEBU: 2.5000 mg | INHALATION_SOLUTION | Freq: Four times a day (QID) | RESPIRATORY_TRACT | Status: DC | PRN

## 2016-09-10 MED ORDER — QUETIAPINE FUMARATE 25 MG PO TABS
50.0000 mg | ORAL_TABLET | Freq: Every day | ORAL | Status: DC
  Filled 2016-09-10: qty 2

## 2016-09-10 MED ORDER — CLONAZEPAM 0.5 MG PO TABS
0.7500 mg | ORAL_TABLET | Freq: Every day | ORAL | Status: DC
  Administered 2016-09-10: 22:00:00 0.75 mg via ORAL
  Filled 2016-09-10: qty 2

## 2016-09-10 MED ORDER — CITALOPRAM HYDROBROMIDE 20 MG PO TABS
40.0000 mg | ORAL_TABLET | Freq: Every day | ORAL | Status: DC
  Administered 2016-09-11: 40 mg via ORAL
  Filled 2016-09-10: qty 2

## 2016-09-10 MED ORDER — ONDANSETRON HCL 4 MG PO TABS
4.0000 mg | ORAL_TABLET | Freq: Three times a day (TID) | ORAL | Status: DC | PRN
  Administered 2016-09-10: 4 mg via ORAL
  Filled 2016-09-10: qty 1

## 2016-09-10 MED ORDER — ASPIRIN 81 MG PO CHEW
324.0000 mg | CHEWABLE_TABLET | Freq: Once | ORAL | Status: AC
  Administered 2016-09-10: 324 mg via ORAL
  Filled 2016-09-10: qty 4

## 2016-09-10 MED ORDER — IOPAMIDOL (ISOVUE-370) INJECTION 76%
75.0000 mL | Freq: Once | INTRAVENOUS | Status: AC | PRN
  Administered 2016-09-10: 75 mL via INTRAVENOUS

## 2016-09-10 MED ORDER — ACETAMINOPHEN 325 MG PO TABS
650.0000 mg | ORAL_TABLET | ORAL | Status: DC | PRN
  Administered 2016-09-10 – 2016-09-11 (×2): 650 mg via ORAL
  Filled 2016-09-10 (×2): qty 2

## 2016-09-10 MED ORDER — GABAPENTIN 100 MG PO CAPS
100.0000 mg | ORAL_CAPSULE | Freq: Two times a day (BID) | ORAL | Status: DC
  Administered 2016-09-10 – 2016-09-11 (×2): 100 mg via ORAL
  Filled 2016-09-10 (×2): qty 1

## 2016-09-10 NOTE — Consult Note (Signed)
Referring Physician: Rifenbark    Chief Complaint: Difficulty with speech  HPI: Maria Horton is an 76 y.o. female with a history of TIA on ASA who reports that she went out for lunch today and when it was time to place her order she was unable to get the words out.  Patient presented for evaluation at that time.  Reports on the way over developed a headache and nausea once she arrives.  Initial NIHISS of 1.  Patient reports not speaking to anyone today prior to lunch.  Patient lives alone. Speech not yet back to baseline but much improved.    Patient reports has had TIA's in the past.  Last was many years ago.  They have all been different but there was one in the past that involved difficulty with speech.    Date last known well: Date: 09/09/2016 Time last known well: Unable to determine tPA Given: No: Unable to determine LKW.  Past Medical History:  Diagnosis Date  . Anxiety   . Chronic back pain   . Constipation   . Depression   . Diverticulosis   . Fibromyalgia   . Gastroparesis   . Hypertension   . Hypoglycemia   . Hypokalemia   . Osteoarthritis   . Reflux   . TIA (transient ischemic attack)   . Vascular dementia   . Vitamin D deficiency     Past Surgical History:  Procedure Laterality Date  . ABDOMINAL HYSTERECTOMY    . CHOLECYSTECTOMY    . COLON SURGERY    . NECK SURGERY    . ROTATOR CUFF REPAIR    . TONSILLECTOMY      Family history: Father with DM, HTN, CAD and stroke.  Mother with DM, HTN, CAD, COPD, asthma, CHF.  Both parents deceased.  Sister with breast cancer.   Social History:  reports that she has never smoked. She has never used smokeless tobacco. She reports that she does not drink alcohol or use drugs.  Allergies:  Allergies  Allergen Reactions  . Ace Inhibitors Other (See Comments)    achy  . Beta Adrenergic Blockers Other (See Comments)    Makes achy/feel sick  . Calcium Channel Blockers Other (See Comments)    Feels achy like sick  . Tape  Rash    Medications: I have reviewed the patient's current medications. Prior to Admission:  Prior to Admission medications   Medication Sig Start Date End Date Taking? Authorizing Provider  albuterol (PROVENTIL HFA;VENTOLIN HFA) 108 (90 Base) MCG/ACT inhaler Inhale 2 puffs into the lungs every 6 (six) hours as needed for wheezing or shortness of breath.   Yes [provider]  aspirin EC 81 MG tablet Take 81 mg by mouth daily.   Yes [provider]  citalopram (CELEXA) 40 MG tablet Take 1 tablet (40 mg total) by mouth daily. Patient taking differently: Take 20 mg by mouth daily.  10/01/15  Yes Pucilowska, Jolanta B, MD  clonazePAM (KLONOPIN) 0.5 MG tablet Take 1.5 tablets (0.75 mg total) by mouth at bedtime. 10/01/15  Yes Pucilowska, Jolanta B, MD  DULoxetine HCl 40 MG CPEP Take 40 mg by mouth daily. 09/01/16  Yes [provider]  fluticasone (FLONASE) 50 MCG/ACT nasal spray Place 2 sprays into both nostrils daily as needed for allergies or rhinitis.   Yes [provider]  gabapentin (NEURONTIN) 100 MG capsule Take 100 mg by mouth 2 (two) times daily. 09/02/16  Yes [provider]  HYDROcodone-acetaminophen (NORCO/VICODIN) 5-325 MG  tablet Take 1-2 tablets by mouth every 8 (eight) hours as needed for moderate pain.    Yes [provider]  ondansetron (ZOFRAN) 4 MG tablet Take 4 mg by mouth every 8 (eight) hours as needed for nausea or vomiting.   Yes [provider]  pravastatin (PRAVACHOL) 10 MG tablet Take 10 mg by mouth daily. 09/01/16  Yes [provider]  traZODone (DESYREL) 150 MG tablet Take 1 tablet (150 mg total) by mouth at bedtime. Patient taking differently: Take 200 mg by mouth at bedtime.  10/01/15  Yes Pucilowska, Jolanta B, MD  triamterene-hydrochlorothiazide (DYAZIDE) 37.5-25 MG capsule Take 1 capsule by mouth daily. 06/22/16  Yes [provider]  QUEtiapine (SEROQUEL) 50 MG tablet Take 1 tablet (50 mg total) by  mouth at bedtime. Patient not taking: Reported on 09/10/2016 10/01/15   Pucilowska, Ellin Goodie, MD     ROS: History obtained from the patient  General ROS: negative for - chills, fatigue, fever, night sweats, weight gain or weight loss Psychological ROS: negative for - behavioral disorder, hallucinations, memory difficulties, mood swings or suicidal ideation Ophthalmic ROS: negative for - blurry vision, double vision, eye pain or loss of vision ENT ROS: negative for - epistaxis, nasal discharge, oral lesions, sore throat, tinnitus or vertigo Allergy and Immunology ROS: negative for - hives or itchy/watery eyes Hematological and Lymphatic ROS: negative for - bleeding problems, bruising or swollen lymph nodes Endocrine ROS: negative for - galactorrhea, hair pattern changes, polydipsia/polyuria or temperature intolerance Respiratory ROS: negative for - cough, hemoptysis, shortness of breath or wheezing Cardiovascular ROS: negative for - chest pain, dyspnea on exertion, edema or irregular heartbeat Gastrointestinal ROS: nausea Genito-Urinary ROS: negative for - dysuria, hematuria, incontinence or urinary frequency/urgency Musculoskeletal ROS: negative for - joint swelling or muscular weakness Neurological ROS: as noted in HPI Dermatological ROS: negative for rash and skin lesion changes  Physical Examination: Blood pressure 123/69, pulse (!) 59, temperature 98.7 F (37.1 C), resp. rate 13, SpO2 100 %.  HEENT-  Normocephalic, no lesions, without obvious abnormality.  Normal external eye and conjunctiva.  Normal TM's bilaterally.  Normal auditory canals and external ears. Normal external nose, mucus membranes and septum.  Normal pharynx. Cardiovascular- S1, S2 normal, pulses palpable throughout   Lungs- chest clear, no wheezing, rales, normal symmetric air entry Abdomen- soft, non-tender; bowel sounds normal; no masses,  no organomegaly Extremities- no edema Lymph-no adenopathy  palpable Musculoskeletal-no joint tenderness, deformity or swelling Skin-warm and dry, no hyperpigmentation, vitiligo, or suspicious lesions  Neurological Examination   Mental Status: Alert, oriented, thought content appropriate.  Speech with some word finding difficulties and occasional paraphasic errors.  Able to follow 3 step commands without difficulty. Cranial Nerves: II: Discs flat bilaterally; Visual fields grossly normal, pupils equal, round, reactive to light and accommodation III,IV, VI: ptosis not present, extra-ocular motions intact bilaterally V,VII: smile symmetric, patient repots difference in facial sensation but no numbness or paresthesia VIII: hearing normal bilaterally IX,X: gag reflex present XI: bilateral shoulder shrug XII: midline tongue extension Motor: Right : Upper extremity   5/5    Left:     Upper extremity   5/5  Lower extremity   5/5     Lower extremity   5/5 Tone and bulk:normal tone throughout; no atrophy noted Sensory: Pinprick and light touch intact throughout, bilaterally Deep Tendon Reflexes: 2+ and symmetric with absent AJ's bilaterally Plantars: Right: mute   Left: mute Cerebellar: Normal finger-to-nose and normal heel-to-shin testing bilaterally Gait: not tested  due to safety concerns    Laboratory Studies:  Basic Metabolic Panel: No results for input(s): NA, K, CL, CO2, GLUCOSE, BUN, CREATININE, CALCIUM, MG, PHOS in the last 168 hours.  Liver Function Tests: No results for input(s): AST, ALT, ALKPHOS, BILITOT, PROT, ALBUMIN in the last 168 hours. No results for input(s): LIPASE, AMYLASE in the last 168 hours. No results for input(s): AMMONIA in the last 168 hours.  CBC: No results for input(s): WBC, NEUTROABS, HGB, HCT, MCV, PLT in the last 168 hours.  Cardiac Enzymes: No results for input(s): CKTOTAL, CKMB, CKMBINDEX, TROPONINI in the last 168 hours.  BNP: Invalid input(s): POCBNP  CBG: No results for input(s): GLUCAP in the last  168 hours.  Microbiology: No results found for this or any previous visit.  Coagulation Studies: No results for input(s): LABPROT, INR in the last 72 hours.  Urinalysis: No results for input(s): COLORURINE, LABSPEC, PHURINE, GLUCOSEU, HGBUR, BILIRUBINUR, KETONESUR, PROTEINUR, UROBILINOGEN, NITRITE, LEUKOCYTESUR in the last 168 hours.  Invalid input(s): APPERANCEUR  Lipid Panel:    Component Value Date/Time   CHOL 275 (H) 09/27/2015 0649   TRIG 255 (H) 09/27/2015 0649   HDL 48 09/27/2015 0649   CHOLHDL 5.7 09/27/2015 0649   VLDL 51 (H) 09/27/2015 0649   LDLCALC 176 (H) 09/27/2015 0649    HgbA1C:  Lab Results  Component Value Date   HGBA1C 5.6 09/27/2015    Urine Drug Screen:      Component Value Date/Time   LABOPIA POSITIVE (A) 09/25/2015 2030   COCAINSCRNUR NONE DETECTED 09/25/2015 2030   LABBENZ NONE DETECTED 09/25/2015 2030   AMPHETMU NONE DETECTED 09/25/2015 2030   THCU NONE DETECTED 09/25/2015 2030   LABBARB NONE DETECTED 09/25/2015 2030    Alcohol Level: No results for input(s): ETH in the last 168 hours.  Other results: EKG: sinus rhythm at 57 bpm.  Imaging: Ct Head Code Stroke Wo Contrast`  Result Date: 09/10/2016 CLINICAL DATA:  Code stroke. 76 y/o F; slurred speech and headache. EXAM: CT HEAD WITHOUT CONTRAST TECHNIQUE: Contiguous axial images were obtained from the base of the skull through the vertex without intravenous contrast. COMPARISON:  None. FINDINGS: Brain: No evidence of large acute infarction, hemorrhage, hydrocephalus, extra-axial collection or mass lesion/mass effect. Few nonspecific foci of hypoattenuation in subcortical white matter are compatible with mild chronic microvascular ischemic changes for age. Mild brain parenchymal volume loss. Vascular: Mild calcific atherosclerosis of carotid siphons. No hyperdense vessel identified. Skull: Normal. Negative for fracture or focal lesion. Sinuses/Orbits: No acute finding. Other: None. ASPECTS  [facility] Stroke Program Early CT Score) - Ganglionic level infarction (caudate, lentiform nuclei, internal capsule, insula, M1-M3 cortex): 7 - Supraganglionic infarction (M4-M6 cortex): 3 Total score (0-10 with 10 being normal): 10 IMPRESSION: 1. No acute intracranial abnormality identified. 2. Mild for age chronic microvascular ischemic changes and mild parenchymal volume loss of the brain. 3. ASPECTS is 10 These results were called by telephone at the time of interpretation on 09/10/2016 at 2:18 pm to Dr. Merrily Brittle , who verbally acknowledged these results. Electronically Signed   By: Mitzi Hansen M.D.   On: 09/10/2016 14:18    Assessment: 76 y.o. female presenting with difficulty with speech.  Symptoms improving although patient not back to baseline.  On ASA at home.  Has had TIA's in the past but none in quite a few years.  Head CT reviewed and shows no acute changes.  Further work up recommended.    Stroke Risk Factors - hypertension  Plan:  1. HgbA1c, fasting lipid panel 2. MRI of the brain without contrast 3. PT consult, OT consult, Speech consult 4. Echocardiogram 5. CTA of the head and neck 6. Prophylactic therapy-Antiplatelet med: Continue ASA 7. NPO until RN stroke swallow screen 8. Telemetry monitoring 9. Frequent neuro checks   Case discussed with Dr. Margette Fast, MD Neurology [phone] 09/10/2016, 2:30 PM

## 2016-09-10 NOTE — ED Notes (Signed)
Patient back in room from CT.

## 2016-09-10 NOTE — Progress Notes (Signed)
CH responded to a PG for a Code Stroke. Pt was being assessed upon my arrival. Pt was having difficulty remembering words. CH contacted daughter and son-in-law. CH stayed with Pt for some time as a calming presence. Seeing that the Pt was in a better state, I excused myself. CH is available for follow up as needed.

## 2016-09-10 NOTE — H&P (Signed)
[employer] - [place] [facility]   PATIENT NAME: Maria Horton    MR#:  [id number]  DATE OF BIRTH:  1940-06-05  DATE OF ADMISSION:  09/10/2016  PRIMARY CARE PHYSICIAN: Verdis Prime, MD   REQUESTING/REFERRING PHYSICIAN: Merrily Brittle MD  CHIEF COMPLAINT:   Chief Complaint  Patient presents with  . Code Stroke    HISTORY OF PRESENT ILLNESS: Maria Horton  is a 76 y.o. female with a known history of Anxiety, chronic back pain, depression, diverticulosis, gastroparesis, central hypertension and vascular dementia who previously has a history of TIA and went to eat lunch after ordering her lunch and eating as she started developing difficulty with speech she was not able to get her words out. Patient came to the emergency room and was seen by neurology and her NIH score was 1. Patient still having some difficulty with expressing herself but is much better. She otherwise denies any weakness in her arms or legs. Patient reports that she ran out of her aspirin for a week    PAST MEDICAL HISTORY:   Past Medical History:  Diagnosis Date  . Anxiety   . Chronic back pain   . Constipation   . Depression   . Diverticulosis   . Fibromyalgia   . Gastroparesis   . Hypertension   . Hypoglycemia   . Hypokalemia   . Osteoarthritis   . Reflux   . TIA (transient ischemic attack)   . Vascular dementia   . Vitamin D deficiency     PAST SURGICAL HISTORY:  Past Surgical History:  Procedure Laterality Date  . ABDOMINAL HYSTERECTOMY    . CHOLECYSTECTOMY    . COLON SURGERY    . NECK SURGERY    . ROTATOR CUFF REPAIR    . TONSILLECTOMY      SOCIAL HISTORY:  Social History  Substance Use Topics  . Smoking status: Never Smoker  . Smokeless tobacco: Never Used  . Alcohol use No    FAMILY HISTORY:  Family History  Problem Relation Age of Onset  . Hypertension Mother     DRUG ALLERGIES:  Allergies  Allergen Reactions  . Ace Inhibitors Other (See Comments)     achy  . Beta Adrenergic Blockers Other (See Comments)    Makes achy/feel sick  . Calcium Channel Blockers Other (See Comments)    Feels achy like sick  . Tape Rash    REVIEW OF SYSTEMS:   CONSTITUTIONAL: No fever, fatigue or weakness.  EYES: No blurred or double vision.  EARS, NOSE, AND THROAT: No tinnitus or ear pain.  RESPIRATORY: No cough, shortness of breath, wheezing or hemoptysis.  CARDIOVASCULAR: No chest pain, orthopnea, edema.  GASTROINTESTINAL: No nausea, vomiting, diarrhea or abdominal pain.  GENITOURINARY: No dysuria, hematuria.  ENDOCRINE: No polyuria, nocturia,  HEMATOLOGY: No anemia, easy bruising or bleeding SKIN: No rash or lesion. MUSCULOSKELETAL: Chronic left shoulder pain as well as arthritis involving other areas   NEUROLOGIC: Difficulty with speech with expression.  PSYCHIATRY: No anxiety or depression.   MEDICATIONS AT HOME:  Prior to Admission medications   Medication Sig Start Date End Date Taking? Authorizing Provider  albuterol (PROVENTIL HFA;VENTOLIN HFA) 108 (90 Base) MCG/ACT inhaler Inhale 2 puffs into the lungs every 6 (six) hours as needed for wheezing or shortness of breath.   Yes [provider]  aspirin EC 81 MG tablet Take 81 mg by mouth daily.   Yes [provider]  citalopram (CELEXA) 40 MG tablet Take 1  tablet (40 mg total) by mouth daily. Patient taking differently: Take 20 mg by mouth daily.  10/01/15  Yes Pucilowska, Jolanta B, MD  clonazePAM (KLONOPIN) 0.5 MG tablet Take 1.5 tablets (0.75 mg total) by mouth at bedtime. 10/01/15  Yes Pucilowska, Jolanta B, MD  DULoxetine HCl 40 MG CPEP Take 40 mg by mouth daily. 09/01/16  Yes [provider]  fluticasone (FLONASE) 50 MCG/ACT nasal spray Place 2 sprays into both nostrils daily as needed for allergies or rhinitis.   Yes [provider]  gabapentin (NEURONTIN) 100 MG capsule Take 100 mg by mouth 2 (two) times daily. 09/02/16  Yes [provider]   HYDROcodone-acetaminophen (NORCO/VICODIN) 5-325 MG tablet Take 1-2 tablets by mouth every 8 (eight) hours as needed for moderate pain.    Yes [provider]  ondansetron (ZOFRAN) 4 MG tablet Take 4 mg by mouth every 8 (eight) hours as needed for nausea or vomiting.   Yes [provider]  pravastatin (PRAVACHOL) 10 MG tablet Take 10 mg by mouth daily. 09/01/16  Yes [provider]  traZODone (DESYREL) 150 MG tablet Take 1 tablet (150 mg total) by mouth at bedtime. Patient taking differently: Take 200 mg by mouth at bedtime.  10/01/15  Yes Pucilowska, Jolanta B, MD  triamterene-hydrochlorothiazide (DYAZIDE) 37.5-25 MG capsule Take 1 capsule by mouth daily. 06/22/16  Yes [provider]  QUEtiapine (SEROQUEL) 50 MG tablet Take 1 tablet (50 mg total) by mouth at bedtime. Patient not taking: Reported on 09/10/2016 10/01/15   Pucilowska, Braulio Conte B, MD      PHYSICAL EXAMINATION:   VITAL SIGNS: Blood pressure 123/69, pulse (!) 59, temperature 98.7 F (37.1 C), resp. rate 13, SpO2 100 %.  GENERAL:  76 y.o.-year-old patient lying in the bed with no acute distress.  EYES: Pupils equal, round, reactive to light and accommodation. No scleral icterus. Extraocular muscles intact.  HEENT: Head atraumatic, normocephalic. Oropharynx and nasopharynx clear.  NECK:  Supple, no jugular venous distention. No thyroid enlargement, no tenderness.  LUNGS: Normal breath sounds bilaterally, no wheezing, rales,rhonchi or crepitation. No use of accessory muscles of respiration.  CARDIOVASCULAR: S1, S2 normal. No murmurs, rubs, or gallops.  ABDOMEN: Soft, nontender, nondistended. Bowel sounds present. No organomegaly or mass.  EXTREMITIES: No pedal edema, cyanosis, or clubbing.  NEUROLOGIC: Cranial nerves II through XII are intact. Muscle strength 5/5 in all extremities. Sensation intact. Gait not checked. Expressive aphasia PSYCHIATRIC: The patient is alert and oriented x 3.  SKIN: No  obvious rash, lesion, or ulcer.   LABORATORY PANEL:   CBC  Recent Labs Lab 09/10/16 1416  WBC 6.1  HGB 14.2  HCT 42.6  PLT 164  MCV 94.4  MCH 31.6  MCHC 33.4  RDW 14.7*  LYMPHSABS 1.6  MONOABS 0.4  EOSABS 0.0  BASOSABS 0.0   ------------------------------------------------------------------------------------------------------------------  Chemistries  No results for input(s): NA, K, CL, CO2, GLUCOSE, BUN, CREATININE, CALCIUM, MG, AST, ALT, ALKPHOS, BILITOT in the last 168 hours.  Invalid input(s): GFRCGP ------------------------------------------------------------------------------------------------------------------ CrCl cannot be calculated (Patient's most recent lab result is older than the maximum 21 days allowed.). ------------------------------------------------------------------------------------------------------------------ No results for input(s): TSH, T4TOTAL, T3FREE, THYROIDAB in the last 72 hours.  Invalid input(s): FREET3   Coagulation profile  Recent Labs Lab 09/10/16 1416  INR 0.93   ------------------------------------------------------------------------------------------------------------------- No results for input(s): DDIMER in the last 72 hours. -------------------------------------------------------------------------------------------------------------------  Cardiac Enzymes No results for input(s): CKMB, TROPONINI, MYOGLOBIN in the last 168 hours.  Invalid input(s): CK ------------------------------------------------------------------------------------------------------------------ Invalid input(s):  POCBNP  ---------------------------------------------------------------------------------------------------------------  Urinalysis    Component Value Date/Time   COLORURINE STRAW (A) 09/25/2015 2030   APPEARANCEUR CLEAR (A) 09/25/2015 2030   LABSPEC 1.011 09/25/2015 2030   PHURINE 5.0 09/25/2015 2030   GLUCOSEU NEGATIVE 09/25/2015 2030    HGBUR 1+ (A) 09/25/2015 2030   BILIRUBINUR NEGATIVE 09/25/2015 2030   KETONESUR NEGATIVE 09/25/2015 2030   PROTEINUR NEGATIVE 09/25/2015 2030   NITRITE NEGATIVE 09/25/2015 2030   LEUKOCYTESUR NEGATIVE 09/25/2015 2030     RADIOLOGY: Ct Head Code Stroke Wo Contrast`  Result Date: 09/10/2016 CLINICAL DATA:  Code stroke. 76 y/o F; slurred speech and headache. EXAM: CT HEAD WITHOUT CONTRAST TECHNIQUE: Contiguous axial images were obtained from the base of the skull through the vertex without intravenous contrast. COMPARISON:  None. FINDINGS: Brain: No evidence of large acute infarction, hemorrhage, hydrocephalus, extra-axial collection or mass lesion/mass effect. Few nonspecific foci of hypoattenuation in subcortical white matter are compatible with mild chronic microvascular ischemic changes for age. Mild brain parenchymal volume loss. Vascular: Mild calcific atherosclerosis of carotid siphons. No hyperdense vessel identified. Skull: Normal. Negative for fracture or focal lesion. Sinuses/Orbits: No acute finding. Other: None. ASPECTS [facility] Stroke Program Early CT Score) - Ganglionic level infarction (caudate, lentiform nuclei, internal capsule, insula, M1-M3 cortex): 7 - Supraganglionic infarction (M4-M6 cortex): 3 Total score (0-10 with 10 being normal): 10 IMPRESSION: 1. No acute intracranial abnormality identified. 2. Mild for age chronic microvascular ischemic changes and mild parenchymal volume loss of the brain. 3. ASPECTS is 10 These results were called by telephone at the time of interpretation on 09/10/2016 at 2:18 pm to Dr. Merrily Brittle , who verbally acknowledged these results. Electronically Signed   By: Mitzi Hansen M.D.   On: 09/10/2016 14:18    EKG: Orders placed or performed during the hospital encounter of 09/10/16  . ED EKG  . ED EKG  . EKG 12-Lead  . EKG 12-Lead    IMPRESSION AND PLAN: Patient is a 76 year old white female with previous history of TIA  1.  TIA/CVA Patient seen by neurology Recommended to have CTA of the neck and brain MRI of the brain Continue aspirin Check fasting lipid panel in the morning Speech eval  2.  Fibromyalgia continue Celexa and Klonopin and Cymbalta and Neurontin  3. Hyperlipidemia unspecified continue Pravachol check lipid panel in the morning  4. Essential hypertension continue Triamteren- HCTZ  5. [place] Lovenox for DVT prophylaxis  All the records are reviewed and case discussed with ED provider. Management plans discussed with the patient, family and they are in agreement.  CODE STATUS: Code Status History    Date Active Date Inactive Code Status Order ID Comments User Context   09/26/2015  8:50 PM 10/01/2015  6:57 PM Full Code [id number]  Clapacs, Jackquline Denmark, MD Inpatient       TOTAL TIME TAKING CARE OF THIS PATIENT: 55 minutes.    Auburn Bilberry M.D on 09/10/2016 at 3:10 PM  Between 7am to 6pm - Pager - [phone]  After 6pm go to [email or web address] - password [email or web address] [facility]  [place] [facility]  Office  [phone]  CC: Primary care physician; Verdis Prime, MD

## 2016-09-10 NOTE — ED Notes (Signed)
Patient transported to CT at this time by this RN. 

## 2016-09-10 NOTE — ED Triage Notes (Signed)
Patient presents to ED via [facility] with slurred speech that began at 1330. Patient also reported a HA. Patient not able to communicate verbally at this time.

## 2016-09-10 NOTE — ED Provider Notes (Signed)
[facility] Emergency Department Provider Note  ____________________________________________   First MD Initiated Contact with Patient 09/10/16 1403     (approximate)  I have reviewed the triage vital signs and the nursing notes.   HISTORY  Chief Complaint Code Stroke    HPI Maria Horton is a 76 y.o. female who comes to the emergency department via EMS with word finding difficulties. She was out to lunch roughly half an hour prior to arrival when all of a sudden it became difficult to speak and she was garbling her words. She denies headache. She denies chest pain shortness of breath. She denies abdominal pain nausea vomiting. She denies numbness or weakness. Her symptoms seem to be slowly progressive and they wererapid in onset.   Past Medical History:  Diagnosis Date  . Anxiety   . Chronic back pain   . Constipation   . Depression   . Diverticulosis   . Fibromyalgia   . Gastroparesis   . Hypertension   . Hypoglycemia   . Hypokalemia   . Osteoarthritis   . Reflux   . TIA (transient ischemic attack)   . Vascular dementia   . Vitamin D deficiency     Patient Active Problem List   Diagnosis Date Noted  . TIA (transient ischemic attack) 09/10/2016  . Chronic pain syndrome 09/30/2015  . GERD (gastroesophageal reflux disease) 09/29/2015  . Severe recurrent major depression without psychotic features (HCC) 09/26/2015  . Suicidal ideation 09/26/2015  . Fibromyalgia 09/26/2015  . Osteoarthritis 09/26/2015  . Hypertension 09/26/2015    Past Surgical History:  Procedure Laterality Date  . ABDOMINAL HYSTERECTOMY    . CHOLECYSTECTOMY    . COLON SURGERY    . NECK SURGERY    . ROTATOR CUFF REPAIR    . TONSILLECTOMY      Prior to Admission medications   Medication Sig Start Date End Date Taking? Authorizing Provider  albuterol (PROVENTIL HFA;VENTOLIN HFA) 108 (90 Base) MCG/ACT inhaler Inhale 2 puffs into the lungs every 6 (six) hours as  needed for wheezing or shortness of breath.   Yes [provider]  clonazePAM (KLONOPIN) 0.5 MG tablet Take 1.5 tablets (0.75 mg total) by mouth at bedtime. 10/01/15  Yes Pucilowska, Jolanta B, MD  DULoxetine HCl 40 MG CPEP Take 40 mg by mouth daily. 09/01/16  Yes [provider]  fluticasone (FLONASE) 50 MCG/ACT nasal spray Place 2 sprays into both nostrils daily as needed for allergies or rhinitis.   Yes [provider]  gabapentin (NEURONTIN) 100 MG capsule Take 100 mg by mouth 2 (two) times daily. 09/02/16  Yes [provider]  HYDROcodone-acetaminophen (NORCO/VICODIN) 5-325 MG tablet Take 1-2 tablets by mouth every 8 (eight) hours as needed for moderate pain.    Yes [provider]  ondansetron (ZOFRAN) 4 MG tablet Take 4 mg by mouth every 8 (eight) hours as needed for nausea or vomiting.   Yes [provider]  pravastatin (PRAVACHOL) 10 MG tablet Take 10 mg by mouth daily. 09/01/16  Yes [provider]  triamterene-hydrochlorothiazide (DYAZIDE) 37.5-25 MG capsule Take 1 capsule by mouth daily. 06/22/16  Yes [provider]  citalopram (CELEXA) 40 MG tablet Take 0.5 tablets (20 mg total) by mouth daily. 09/11/16   Adrian Saran, MD  clopidogrel (PLAVIX) 75 MG tablet Take 1 tablet (75 mg total) by mouth daily. 09/11/16 09/11/17  Adrian Saran, MD  traZODone (DESYREL) 150 MG tablet Take 1.5 tablets (225 mg total) by mouth at  bedtime. 09/11/16   Adrian Saran, MD    Allergies Ace inhibitors; Beta adrenergic blockers; Calcium channel blockers; and Tape  Family History  Problem Relation Age of Onset  . Hypertension Mother     Social History Social History  Substance Use Topics  . Smoking status: Never Smoker  . Smokeless tobacco: Never Used  . Alcohol use No    Review of Systems Constitutional: No fever/chills Eyes: No visual changes. ENT: No sore throat. Cardiovascular: Denies chest pain. Respiratory: Denies shortness of  breath. Gastrointestinal: No abdominal pain.  No nausea, no vomiting.  No diarrhea.  No constipation. Genitourinary: Negative for dysuria. Musculoskeletal: Negative for back pain. Skin: Negative for rash. Neurological: Negative for headaches   ____________________________________________   PHYSICAL EXAM:  VITAL SIGNS: ED Triage Vitals  Enc Vitals Group     BP      Pulse      Resp      Temp      Temp src      SpO2      Weight      Height      Head Circumference      Peak Flow      Pain Score      Pain Loc      Pain Edu?      Excl. in GC?     Constitutional: alert and oriented 4 anxious appearing Eyes: PERRL EOMI. Head: Atraumatic. Nose: No congestion/rhinnorhea. Mouth/Throat: No trismus Neck: No stridor.   Cardiovascular: Normal rate, regular rhythm. Grossly normal heart sounds.  Good peripheral circulation. Respiratory: Normal respiratory effort.  No retractions. Lungs CTAB and moving good air Gastrointestinal: nontender Musculoskeletal: No lower extremity edema   Neurologic:  Alert and oriented 4 broken and difficult speech Cranial nerves II through XII intact No pronator drift 5 out of 5 grips, biceps, triceps, hip flexion, hip extension plantar flexion, dorsiflexion Sensation intact to light touch throughout 2+ DTRs and no ankle clonus Normal finger-nose-finger  Skin:  Skin is warm, dry and intact. No rash noted. Psychiatric: anxious.    ____________________________________________   DIFFERENTIAL includes but not limited to  Stroke, TIA, intracerebral hemorrhage, subarachnoid hemorrhage, migraine ____________________________________________   LABS (all labs ordered are listed, but only abnormal results are displayed)  Labs Reviewed  CBC - Abnormal; Notable for the following:       Result Value   RDW 14.7 (*)    All other components within normal limits  COMPREHENSIVE METABOLIC PANEL - Abnormal; Notable for the following:    Potassium 3.0  (*)    Chloride 99 (*)    CO2 33 (*)    Glucose, Bld 156 (*)    Creatinine, Ser 1.03 (*)    ALT 12 (*)    GFR calc non Af Amer 52 (*)    All other components within normal limits  URINALYSIS, ROUTINE W REFLEX MICROSCOPIC - Abnormal; Notable for the following:    Color, Urine YELLOW (*)    APPearance CLEAR (*)    Specific Gravity, Urine >1.046 (*)    All other components within normal limits  URINE DRUG SCREEN, QUALITATIVE ([facility] ONLY) - Abnormal; Notable for the following:    Opiate, Ur Screen POSITIVE (*)    All other components within normal limits  LIPID PANEL - Abnormal; Notable for the following:    Triglycerides 373 (*)    HDL 34 (*)    VLDL 75 (*)    All other components within normal limits  ETHANOL  PROTIME-INR  APTT  DIFFERENTIAL  TROPONIN I  HEMOGLOBIN A1C    No signs of acute ischemia __________________________________________  EKG  ED ECG REPORT I, Merrily Brittle, the attending physician, personally viewed and interpreted this ECG.  Date: 09/10/2016 EKG Time:  Rate: 58 Rhythm: sinus bradycardia QRS Axis: normal Intervals: prolonged QT ST/T Wave abnormalities: normal Narrative Interpretation: abnormal  ____________________________________________  RADIOLOGY  Head CT with no acute disease ____________________________________________   PROCEDURES  Procedure(s) performed: no  Procedures  Critical Care performed: yes  CRITICAL CARE Performed by: Merrily Brittle   Total critical care time: 30 minutes  Critical care time was exclusive of separately billable procedures and treating other patients.  Critical care was necessary to treat or prevent imminent or life-threatening deterioration.  Critical care was time spent personally by me on the following activities: development of treatment plan with patient and/or surrogate as well as nursing, discussions with consultants, evaluation of patient's response to treatment, examination of patient,  obtaining history from patient or surrogate, ordering and performing treatments and interventions, ordering and review of laboratory studies, ordering and review of radiographic studies, pulse oximetry and re-evaluation of patient's condition.   Observation: no ____________________________________________   INITIAL IMPRESSION / ASSESSMENT AND PLAN / ED COURSE  Pertinent labs & imaging results that were available during my care of the patient were reviewed by me and considered in my medical decision making (see chart for details).  ----------------------------------------- The patient arrives with focal neuro deficit concerning for cerebrovascular accident. She was taken immediately to the CT scan. Dr. Thad Ranger of stroke neurology notified and    ----------------------------------------- 2:26 PM on 09/10/2016 -----------------------------------------  Dr. Thad Ranger feels the patient is rapidly improving although not back to her baseline. She is not a TPA candidate. She has ordered a CT angiogram of the neck looking for large vessel occlusion but if that is negative she recommends inpatient admission to our facility. ASA now.  ____________________________________________   FINAL CLINICAL IMPRESSION(S) / ED DIAGNOSES  Final diagnoses:  Cerebrovascular accident (CVA), unspecified mechanism (HCC)      NEW MEDICATIONS STARTED DURING THIS VISIT:  Discharge Medication List as of 09/11/2016  2:17 PM    START taking these medications   Details  clopidogrel (PLAVIX) 75 MG tablet Take 1 tablet (75 mg total) by mouth daily., Starting Sat 09/11/2016, Until Sun 09/11/2017, Normal         Note:  This document was prepared using Dragon voice recognition software and may include unintentional dictation errors.     Merrily Brittle, MD 09/13/16 [phone]

## 2016-09-11 ENCOUNTER — Observation Stay: Admit: 2016-09-11 | Payer: Medicare Other

## 2016-09-11 ENCOUNTER — Observation Stay: Payer: Medicare Other

## 2016-09-11 DIAGNOSIS — G459 Transient cerebral ischemic attack, unspecified: Secondary | ICD-10-CM | POA: Diagnosis not present

## 2016-09-11 LAB — LIPID PANEL
CHOLESTEROL: 179 mg/dL (ref 0–200)
HDL: 34 mg/dL — ABNORMAL LOW (ref >40)
LDL CALC: 70 mg/dL (ref 0–99)
TRIGLYCERIDES: 373 mg/dL — AB (ref <150)
Total CHOL/HDL Ratio: 5.3 RATIO
VLDL: 75 mg/dL — ABNORMAL HIGH (ref 0–40)

## 2016-09-11 LAB — HEMOGLOBIN A1C
HEMOGLOBIN A1C: 5.4 % (ref 4.8–5.6)
MEAN PLASMA GLUCOSE: 108.28 mg/dL

## 2016-09-11 MED ORDER — SODIUM CHLORIDE 0.9% FLUSH
3.0000 mL | Freq: Two times a day (BID) | INTRAVENOUS | Status: DC
  Administered 2016-09-11: 09:00:00 3 mL via INTRAVENOUS

## 2016-09-11 MED ORDER — CLOPIDOGREL BISULFATE 75 MG PO TABS: 75.0000 mg | ORAL_TABLET | Freq: Every day | ORAL | 11 refills | Status: AC

## 2016-09-11 MED ORDER — TRAZODONE HCL 150 MG PO TABS: 200.0000 mg | ORAL_TABLET | Freq: Every day | ORAL | Status: DC

## 2016-09-11 MED ORDER — SODIUM CHLORIDE 0.9% FLUSH: 3.0000 mL | INTRAVENOUS | Status: DC | PRN

## 2016-09-11 MED ORDER — CITALOPRAM HYDROBROMIDE 40 MG PO TABS: 20.0000 mg | ORAL_TABLET | Freq: Every day | ORAL | Status: DC

## 2016-09-11 NOTE — Discharge Summary (Signed)
[employer] - [place] at [facility]   PATIENT NAME: Maria Horton    MR#:  [id number]  DATE OF BIRTH:  05-02-1940  DATE OF ADMISSION:  09/10/2016 ADMITTING PHYSICIAN: Auburn Bilberry, MD  DATE OF DISCHARGE: 09/11/2016  PRIMARY CARE PHYSICIAN: Verdis Prime, MD    ADMISSION DIAGNOSIS:  Cerebrovascular accident (CVA), unspecified mechanism (HCC) [I63.9]  DISCHARGE DIAGNOSIS:  Active Problems:   TIA (transient ischemic attack)   SECONDARY DIAGNOSIS:   Past Medical History:  Diagnosis Date  . Anxiety   . Chronic back pain   . Constipation   . Depression   . Diverticulosis   . Fibromyalgia   . Gastroparesis   . Hypertension   . Hypoglycemia   . Hypokalemia   . Osteoarthritis   . Reflux   . TIA (transient ischemic attack)   . Vascular dementia   . Vitamin D deficiency     HOSPITAL COURSE:    76 year old female with history of TIAs on aspirin who presents with difficulty with speech.  1. 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 She could not get the ECHO due to the tech leaving early.We ask that PCP please order outpatient ECHO. She was evaluated by neurology as well. She will be on ASA 81 mg daily as well  Plavix and she will continue on statin. Her symptoms have resolved. No arrhythmia noted on telemetry  2. Essential hypertension: Continue Dyazide  3. Hyperlipidemia: Continue statin   4. Anxiety and depression: Patient will continue outpatient regimen      DISCHARGE CONDITIONS AND DIET:   Stable for discharge on cardiac diet   CONSULTS OBTAINED:  Treatment Team:  Kym Groom, MD  DRUG ALLERGIES:   Allergies  Allergen Reactions  . Ace Inhibitors Other (See Comments)    achy  . Beta Adrenergic Blockers Other (See Comments)    Makes achy/feel sick  . Calcium Channel Blockers Other (See Comments)     Feels achy like sick  . Tape Rash    DISCHARGE MEDICATIONS:   Current Discharge Medication List    START taking these medications   Details  clopidogrel (PLAVIX) 75 MG tablet Take 1 tablet (75 mg total) by mouth daily. Qty: 30 tablet, Refills: 11      CONTINUE these medications which have CHANGED   Details  citalopram (CELEXA) 40 MG tablet Take 0.5 tablets (20 mg total) by mouth daily.    traZODone (DESYREL) 150 MG tablet Take 1.5 tablets (225 mg total) by mouth at bedtime.      CONTINUE these medications which have NOT CHANGED   Details  albuterol (PROVENTIL HFA;VENTOLIN HFA) 108 (90 Base) MCG/ACT inhaler Inhale 2 puffs into the lungs every 6 (six) hours as needed for wheezing or shortness of breath.    clonazePAM (KLONOPIN) 0.5 MG tablet Take 1.5 tablets (0.75 mg total) by mouth at bedtime. Qty: 45 tablet, Refills: 0    DULoxetine HCl 40 MG CPEP Take 40 mg by mouth daily.    fluticasone (FLONASE) 50 MCG/ACT nasal spray Place 2 sprays into both nostrils daily as needed for allergies or rhinitis.    gabapentin (NEURONTIN) 100 MG capsule Take 100 mg by mouth 2 (two) times daily.    HYDROcodone-acetaminophen (NORCO/VICODIN) 5-325 MG tablet Take 1-2 tablets by mouth every 8 (eight) hours as needed for moderate pain.     ondansetron (ZOFRAN) 4  MG tablet Take 4 mg by mouth every 8 (eight) hours as needed for nausea or vomiting.    pravastatin (PRAVACHOL) 10 MG tablet Take 10 mg by mouth daily.    triamterene-hydrochlorothiazide (DYAZIDE) 37.5-25 MG capsule Take 1 capsule by mouth daily.    ASa 81 mg daily     STOP taking these medications           QUEtiapine (SEROQUEL) 50 MG tablet           Today   CHIEF COMPLAINT:   No acute issues overnight. No neurological deficits. Her speech is back to normal.   VITAL SIGNS:  Blood pressure (!) 105/46, pulse (!) 48, temperature 97.9 F (36.6 C), temperature source Oral, resp. rate 20, height 5\' 4"  (1.626 m), weight  65.5 kg (144 lb 4.8 oz), SpO2 97 %.   REVIEW OF SYSTEMS:  Review of Systems  Constitutional: Negative.  Negative for chills, fever and malaise/fatigue.  HENT: Negative.  Negative for ear discharge, ear pain, hearing loss, nosebleeds and sore throat.   Eyes: Negative.  Negative for blurred vision and pain.  Respiratory: Negative.  Negative for cough, hemoptysis, shortness of breath and wheezing.   Cardiovascular: Negative.  Negative for chest pain, palpitations and leg swelling.  Gastrointestinal: Negative.  Negative for abdominal pain, blood in stool, diarrhea, nausea and vomiting.  Genitourinary: Negative.  Negative for dysuria.  Musculoskeletal: Negative.  Negative for back pain.  Skin: Negative.   Neurological: Negative for dizziness, tremors, speech change, focal weakness, seizures and headaches.  Endo/Heme/Allergies: Negative.  Does not bruise/bleed easily.  Psychiatric/Behavioral: Negative.  Negative for depression, hallucinations and suicidal ideas.     PHYSICAL EXAMINATION:  GENERAL:  76 y.o.-year-old patient lying in the bed with no acute distress.  NECK:  Supple, no jugular venous distention. No thyroid enlargement, no tenderness.  LUNGS: Normal breath sounds bilaterally, no wheezing, rales,rhonchi  No use of accessory muscles of respiration.  CARDIOVASCULAR: S1, S2 normal. No murmurs, rubs, or gallops.  ABDOMEN: Soft, non-tender, non-distended. Bowel sounds present. No organomegaly or mass.  EXTREMITIES: No pedal edema, cyanosis, or clubbing.  PSYCHIATRIC: The patient is alert and oriented x 3.  SKIN: No obvious rash, lesion, or ulcer.   DATA REVIEW:   CBC  Recent Labs Lab 09/10/16 1416  WBC 6.1  HGB 14.2  HCT 42.6  PLT 164    Chemistries   Recent Labs Lab 09/10/16 1416  NA 142  K 3.0*  CL 99*  CO2 33*  GLUCOSE 156*  BUN 15  CREATININE 1.03*  CALCIUM 9.5  AST 22  ALT 12*  ALKPHOS 49  BILITOT 0.7    Cardiac Enzymes  Recent Labs Lab  09/10/16 1416  TROPONINI <0.03    Microbiology Results  @[email or web address] @  RADIOLOGY:  Ct Angio Head W Or Wo Contrast  Result Date: 09/10/2016 CLINICAL DATA:  Slurred speech and headache EXAM: CT ANGIOGRAPHY HEAD AND NECK TECHNIQUE: Multidetector CT imaging of the head and neck was performed using the standard protocol during bolus administration of intravenous contrast. Multiplanar CT image reconstructions and MIPs were obtained to evaluate the vascular anatomy. Carotid stenosis measurements (when applicable) are obtained utilizing NASCET criteria, using the distal internal carotid diameter as the denominator. CONTRAST:  75 mL Isovue 370 COMPARISON:  Head CT 09/10/2016 FINDINGS: CTA NECK FINDINGS Aortic arch: There is no aneurysm or dissection of the visualized ascending aorta or aortic arch. There is a normal variant aortic arch branching pattern with the brachiocephalic and left common  carotid arteries sharing a common origin. The visualized proximal subclavian arteries are normal. Right carotid system: The right common carotid origin is widely patent. There is no common carotid or internal carotid artery dissection or aneurysm. No hemodynamically significant stenosis. Left carotid system: The left common carotid origin is widely patent. There is no common carotid or internal carotid artery dissection or aneurysm. No hemodynamically significant stenosis. Vertebral arteries: The vertebral system is left dominant. Both vertebral artery origins are normal. Both vertebral arteries are normal to their confluence with the basilar artery. Skeleton: Status post C5-6 ACDF.  No bony spinal canal stenosis. Other neck: The nasopharynx is clear. The oropharynx and hypopharynx are normal. The epiglottis is normal. The supraglottic larynx, glottis and subglottic larynx are normal. No retropharyngeal collection. The parapharyngeal spaces are preserved. The parotid and submandibular glands are normal. No sialolithiasis or  salivary ductal dilatation. The thyroid gland is normal. There is no cervical lymphadenopathy. Upper chest: No pneumothorax or pleural effusion. No nodules or masses. Review of the MIP images confirms the above findings CTA HEAD FINDINGS Anterior circulation: --Intracranial internal carotid arteries: Minimal atherosclerotic calcification. No stenosis. 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 --Middle cerebral arteries: Normal. --Posterior communicating arteries: Absent bilaterally. Posterior circulation: --Posterior cerebral arteries: Normal. --Superior cerebellar arteries: Normal. --Basilar artery: Normal. --Anterior inferior cerebellar arteries: Normal. --Posterior inferior cerebellar arteries: Normal. Venous sinuses: As permitted by contrast timing, patent. 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 Delayed phase: No parenchymal contrast enhancement. Review of the MIP images confirms the above findings IMPRESSION: Normal CTA of the head and neck without occlusion, dissection or hemodynamically significant stenosis. Electronically Signed   By: Deatra Robinson M.D.   On: 09/10/2016 16:03   Ct Angio Neck W Or Wo Contrast  Result Date: 09/10/2016 CLINICAL DATA:  Slurred speech and headache EXAM: CT ANGIOGRAPHY HEAD AND NECK TECHNIQUE: Multidetector CT imaging of the head and neck was performed using the standard protocol during bolus administration of intravenous contrast. Multiplanar CT image reconstructions and MIPs were obtained to evaluate the vascular anatomy. Carotid stenosis measurements (when applicable) are obtained utilizing NASCET criteria, using the distal internal carotid diameter as the denominator. CONTRAST:  75 mL Isovue 370 COMPARISON:  Head CT 09/10/2016 FINDINGS: CTA NECK FINDINGS Aortic arch: There is no aneurysm or dissection of the visualized ascending aorta or aortic arch. There is a normal  variant aortic arch branching pattern with the brachiocephalic and left common carotid arteries sharing a common origin. The visualized proximal subclavian arteries are normal. Right carotid system: The right common carotid origin is widely patent. There is no common carotid or internal carotid artery dissection or aneurysm. No hemodynamically significant stenosis. Left carotid system: The left common carotid origin is widely patent. There is no common carotid or internal carotid artery dissection or aneurysm. No hemodynamically significant stenosis. Vertebral arteries: The vertebral system is left dominant. Both vertebral artery origins are normal. Both vertebral arteries are normal to their confluence with the basilar artery. Skeleton: Status post C5-6 ACDF.  No bony spinal canal stenosis. Other neck: The nasopharynx is clear. The oropharynx and hypopharynx are normal. The epiglottis is normal. The supraglottic larynx, glottis and subglottic larynx are normal. No retropharyngeal collection. The parapharyngeal spaces are preserved. The parotid and submandibular glands are normal. No sialolithiasis or salivary ductal dilatation. The thyroid gland is normal. There is no cervical lymphadenopathy. Upper chest: No pneumothorax or pleural effusion. No nodules or  masses. Review of the MIP images confirms the above findings CTA HEAD FINDINGS Anterior circulation: --Intracranial internal carotid arteries: Minimal atherosclerotic calcification. No stenosis. 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 --Middle cerebral arteries: Normal. --Posterior communicating arteries: Absent bilaterally. Posterior circulation: --Posterior cerebral arteries: Normal. --Superior cerebellar arteries: Normal. --Basilar artery: Normal. --Anterior inferior cerebellar arteries: Normal. --Posterior inferior cerebellar arteries: Normal. Venous sinuses: As permitted by contrast timing, patent. 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 Delayed phase: No parenchymal contrast enhancement. Review of the MIP images confirms the above findings IMPRESSION: Normal CTA of the head and neck without occlusion, dissection or hemodynamically significant stenosis. Electronically Signed   By: Deatra Robinson M.D.   On: 09/10/2016 16:03   Ct Head Code Stroke Wo Contrast`  Result Date: 09/10/2016 CLINICAL DATA:  Code stroke. 76 y/o F; slurred speech and headache. EXAM: CT HEAD WITHOUT CONTRAST TECHNIQUE: Contiguous axial images were obtained from the base of the skull through the vertex without intravenous contrast. COMPARISON:  None. FINDINGS: Brain: No evidence of large acute infarction, hemorrhage, hydrocephalus, extra-axial collection or mass lesion/mass effect. Few nonspecific foci of hypoattenuation in subcortical white matter are compatible with mild chronic microvascular ischemic changes for age. Mild brain parenchymal volume loss. Vascular: Mild calcific atherosclerosis of carotid siphons. No hyperdense vessel identified. Skull: Normal. Negative for fracture or focal lesion. Sinuses/Orbits: No acute finding. Other: None. ASPECTS [facility] Stroke Program Early CT Score) - Ganglionic level infarction (caudate, lentiform nuclei, internal capsule, insula, M1-M3 cortex): 7 - Supraganglionic infarction (M4-M6 cortex): 3 Total score (0-10 with 10 being normal): 10 IMPRESSION: 1. No acute intracranial abnormality identified. 2. Mild for age chronic microvascular ischemic changes and mild parenchymal volume loss of the brain. 3. ASPECTS is 10 These results were called by telephone at the time of interpretation on 09/10/2016 at 2:18 pm to Dr. Merrily Brittle , who verbally acknowledged these results. Electronically Signed   By: Mitzi Hansen M.D.   On: 09/10/2016 14:18      Current Discharge Medication List    START taking these medications   Details  clopidogrel  (PLAVIX) 75 MG tablet Take 1 tablet (75 mg total) by mouth daily. Qty: 30 tablet, Refills: 11      CONTINUE these medications which have CHANGED   Details  citalopram (CELEXA) 40 MG tablet Take 0.5 tablets (20 mg total) by mouth daily.    traZODone (DESYREL) 150 MG tablet Take 1.5 tablets (225 mg total) by mouth at bedtime.      CONTINUE these medications which have NOT CHANGED   Details  albuterol (PROVENTIL HFA;VENTOLIN HFA) 108 (90 Base) MCG/ACT inhaler Inhale 2 puffs into the lungs every 6 (six) hours as needed for wheezing or shortness of breath.    clonazePAM (KLONOPIN) 0.5 MG tablet Take 1.5 tablets (0.75 mg total) by mouth at bedtime. Qty: 45 tablet, Refills: 0    DULoxetine HCl 40 MG CPEP Take 40 mg by mouth daily.    fluticasone (FLONASE) 50 MCG/ACT nasal spray Place 2 sprays into both nostrils daily as needed for allergies or rhinitis.    gabapentin (NEURONTIN) 100 MG capsule Take 100 mg by mouth 2 (two) times daily.    HYDROcodone-acetaminophen (NORCO/VICODIN) 5-325 MG tablet Take 1-2 tablets by mouth every 8 (eight) hours as needed for moderate pain.     ondansetron (ZOFRAN) 4 MG tablet Take 4 mg by mouth every 8 (eight) hours as needed for nausea  or vomiting.    pravastatin (PRAVACHOL) 10 MG tablet Take 10 mg by mouth daily.    triamterene-hydrochlorothiazide (DYAZIDE) 37.5-25 MG capsule Take 1 capsule by mouth daily.      STOP taking these medications     aspirin EC 81 MG tablet      QUEtiapine (SEROQUEL) 50 MG tablet            Management plans discussed with the patient and she is in agreement. Stable for discharge   Patient should follow up with pcp  CODE STATUS:     Code Status Orders        Start     Ordered   09/10/16 1618  Full code  Continuous     09/10/16 1617    Code Status History    Date Active Date Inactive Code Status Order ID Comments User Context   09/26/2015  8:50 PM 10/01/2015  6:57 PM Full Code [id number]  Clapacs, Jackquline Denmark, MD  Inpatient    Advance Directive Documentation     Most Recent Value  Type of Advance Directive  Healthcare Power of Attorney, Living will  Pre-existing out of facility DNR order (yellow form or pink MOST form)  -  "MOST" Form in Place?  -      TOTAL TIME TAKING CARE OF THIS PATIENT: 38 minutes.    Note: This dictation was prepared with Dragon dictation along with smaller phrase technology. Any transcriptional errors that result from this process are unintentional.  Rylin Seavey M.D on 09/11/2016 at 8:08 AM  Between 7am to 6pm - Pager - [phone] After 6pm go to [email or web address] - Social research officer, government  [facility]  Office  [phone]  CC: Primary care physician; Verdis Prime, MD

## 2016-09-11 NOTE — Progress Notes (Signed)
OT Cancellation Note  Patient Details Name: Maria Horton MRN: [id number] DOB: 04/27/1940   Cancelled Treatment:    Reason Eval/Treat Not Completed: OT screened, no needs identified, will sign off. OT discussed with patient OT deficits previously noted. Patient stated resolved. OT noted no deficits during screen. Notify OT if changes occur.  Rithvik Orcutt Toma Copier, OTR/L 09/11/2016, 11:41 AM

## 2016-09-11 NOTE — Progress Notes (Signed)
Patient documented as a high fall risk. Patient refused bed alarm.

## 2016-09-11 NOTE — Progress Notes (Signed)
Dr. Juliene Pina has spoken with pt regarding + infarct on MRI/MRA scans. Friend and dgt at bedside. ECHO to be planned on out patient basis with pt agreeable to this. NIH=0.  Tylenol given for frontal HA with partial relief. VSS. Independent with no deficits. Dgt plans to stay with pt tonight. Oral and written AVS instructions given to pt and dgt with repeat back, questions answered to their satisfaction. Discharge home to self/family care. Pt transported to private vehicle in transport chair.

## 2016-09-11 NOTE — Progress Notes (Signed)
PT Cancellation Note  Patient Details Name: MYRLA AMERSON MRN: [id number] DOB: 26-Jun-1940   Cancelled Treatment:    Reason Eval/Treat Not Completed: Fatigue/lethargy limiting ability to participate. After performing chart review, PT assessment attempted 0905. Patient asleep in room, unwilling to participate at this time. Will check back later if time permits.   Neita Carp, PT, DPT 09/11/2016, 9:08 AM

## 2016-09-11 NOTE — Progress Notes (Signed)
Physical Therapy Evaluation Patient Details Name: Maria Horton MRN: [id number] DOB: September 23, 1940 Today's Date: 09/11/2016   History of Present Illness  Patient is a 76 y.o. female admitted on 17 AUG with difficulty speaking/TIA. PMH includes anxiety, chronic LBP, depression, diverticulosis, gastroparesis, HTN, and vascular dementia.  Clinical Impression  Patient is a pleasant female admitted for above listed reasons. On evaluation, patient demonstrates equal light touch bilaterally with gross muscle strength 4+-5/5. Patient is independent in all aspects of mobility with two reported falls in past 6 months without injury. Patient uses SPC at baseline on R when feeling unsteady, but she does report occasional difficulty rising from toilet at home and may benefit from raised toilet seat. Patient is otherwise deemed to be at baseline level of function and does not require PT f/u upon d/c.    Follow Up Recommendations No PT follow up    Equipment Recommendations  None recommended by PT;Other (comment) (Raised toilet seat)    Recommendations for Other Services       Precautions / Restrictions Precautions Precautions: Fall Restrictions Weight Bearing Restrictions: No      Mobility  Bed Mobility Overal bed mobility: Independent             General bed mobility comments: Patient performs bed mobility independently.  Transfers Overall transfer level: Independent Equipment used: None             General transfer comment: Patient moves from sit to stand and stand to sit safely without AD.  Ambulation/Gait Ambulation/Gait assistance: Independent Ambulation Distance (Feet): 200 Feet Assistive device: None       General Gait Details: Patient ambulates with step through gait pattern at community speeds.  Stairs            Wheelchair Mobility    Modified Rankin (Stroke Patients Only)       Balance Overall balance assessment: Independent;History of Falls                                           Pertinent Vitals/Pain Pain Assessment: No/denies pain    Home Living Family/patient expects to be discharged to:: Private residence Living Arrangements: Alone Available Help at Discharge: Family;Available PRN/intermittently;Other (Comment) (Daughter lives in [place]) Type of Home: House Home Access: Stairs to enter Entrance Stairs-Rails: None (Is planning on getting rails installed) Secretary/administrator of Steps: 3 Home Layout: One level Home Equipment: Cane - single point      Prior Function Level of Independence: Independent (Uses SPC on R if feeling unsteady)         Comments: Patient has had two admitted falls in past 6 months     Hand Dominance        Extremity/Trunk Assessment   Upper Extremity Assessment Upper Extremity Assessment: Overall WFL for tasks assessed    Lower Extremity Assessment Lower Extremity Assessment: Overall WFL for tasks assessed       Communication   Communication: No difficulties  Cognition Arousal/Alertness: Awake/alert Behavior During Therapy: WFL for tasks assessed/performed Overall Cognitive Status: Within Functional Limits for tasks assessed                                        General Comments      Exercises     Assessment/Plan  PT Assessment Patent does not need any further PT services  PT Problem List         PT Treatment Interventions      PT Goals (Current goals can be found in the Care Plan section)  Acute Rehab PT Goals Patient Stated Goal: "To get moving" PT Goal Formulation: With patient Time For Goal Achievement: 09/25/16 Potential to Achieve Goals: Good    Frequency     Barriers to discharge        Co-evaluation               AM-PAC PT "6 Clicks" Daily Activity  Outcome Measure Difficulty turning over in bed (including adjusting bedclothes, sheets and blankets)?: None Difficulty moving from lying on back to  sitting on the side of the bed? : None Difficulty sitting down on and standing up from a chair with arms (e.g., wheelchair, bedside commode, etc,.)?: None Help needed moving to and from a bed to chair (including a wheelchair)?: None Help needed walking in hospital room?: None Help needed climbing 3-5 steps with a railing? : None 6 Click Score: 24    End of Session Equipment Utilized During Treatment: Gait belt Activity Tolerance: Patient tolerated treatment well Patient left: in bed;with call bell/phone within reach   PT Visit Diagnosis: Muscle weakness (generalized) (M62.81)    Time: 4982-6415 PT Time Calculation (min) (ACUTE ONLY): 13 min   Charges:   PT Evaluation $PT Eval Low Complexity: 1 Low     PT G Codes:   PT G-Codes **NOT FOR INPATIENT CLASS** Functional Assessment Tool Used: AM-PAC 6 Clicks Basic Mobility;Clinical judgement Functional Limitation: Mobility: Walking and moving around Mobility: Walking and Moving Around Current Status (A3094): At least 1 percent but less than 20 percent impaired, limited or restricted Mobility: Walking and Moving Around Goal Status [phone]): At least 1 percent but less than 20 percent impaired, limited or restricted Mobility: Walking and Moving Around Discharge Status [phone]): At least 1 percent but less than 20 percent impaired, limited or restricted      Neita Carp, PT, DPT 09/11/2016, 10:45 AM

## 2016-09-11 NOTE — Evaluation (Signed)
SLP Cancellation Note  Patient Details Name: Maria Horton MRN: [id number] DOB: Apr 06, 1940   Cancelled treatment:       Reason Eval/Treat Not Completed: SLP screened, no needs identified, will sign off SLP discussed with pt speech deficits previously noted. Pt stated resolved. slp noted no deficits during screen. Notify SLP if changes occur.    Meredith Pel Sauber 09/11/2016, 9:27 AM

## 2016-09-11 NOTE — Progress Notes (Signed)
Subjective: Patient reports that her speech has returned to baseline.  This happened sometime overnight.    Objective: Current vital signs: BP (!) 109/42   Pulse (!) 48   Temp 98 F (36.7 C) (Oral)   Resp 20   Ht 5\' 4"  (1.626 m)   Wt 65.5 kg (144 lb 4.8 oz)   SpO2 94%   BMI 24.77 kg/m  Vital signs in last 24 hours: Temp:  [97.9 F (36.6 C)-98.9 F (37.2 C)] 98 F (36.7 C) (08/18 0814) Pulse Rate:  [47-59] 48 (08/18 0816) Resp:  [13-20] 20 (08/18 0424) BP: (80-124)/(35-69) 109/42 (08/18 0816) SpO2:  [94 %-100 %] 94 % (08/18 0814) Weight:  [65.5 kg (144 lb 4.8 oz)] 65.5 kg (144 lb 4.8 oz) (08/17 1621)  Intake/Output from previous day: 08/17 0701 - 08/18 0700 In: 240 [P.O.:240] Out: 900 [Urine:900] Intake/Output this shift: Total I/O In: 120 [P.O.:120] Out: -  Nutritional status: Diet Heart Room service appropriate? Yes; Fluid consistency: Thin  Neurologic Exam: Mental Status: Alert, oriented, thought content appropriate.  Speech fluent without evidence of aphasia.  Able to follow 3 step commands without difficulty. Cranial Nerves: II: Discs flat bilaterally; Visual fields grossly normal, pupils equal, round, reactive to light and accommodation III,IV, VI: ptosis not present, extra-ocular motions intact bilaterally V,VII: smile symmetric, facial light touch sensation normal bilaterally VIII: hearing normal bilaterally IX,X: gag reflex present XI: bilateral shoulder shrug XII: midline tongue extension Motor: Right : Upper extremity   5/5    Left:     Upper extremity   5/5  Lower extremity   5/5     Lower extremity   5/5 Tone and bulk:normal tone throughout; no atrophy noted Sensory: Pinprick and light touch intact throughout, bilaterally   Lab Results: Basic Metabolic Panel:  Recent Labs Lab 09/10/16 1416  NA 142  K 3.0*  CL 99*  CO2 33*  GLUCOSE 156*  BUN 15  CREATININE 1.03*  CALCIUM 9.5    Liver Function Tests:  Recent Labs Lab 09/10/16 1416   AST 22  ALT 12*  ALKPHOS 49  BILITOT 0.7  PROT 6.9  ALBUMIN 3.8   No results for input(s): LIPASE, AMYLASE in the last 168 hours. No results for input(s): AMMONIA in the last 168 hours.  CBC:  Recent Labs Lab 09/10/16 1416  WBC 6.1  NEUTROABS 4.1  HGB 14.2  HCT 42.6  MCV 94.4  PLT 164    Cardiac Enzymes:  Recent Labs Lab 09/10/16 1416  TROPONINI <0.03    Lipid Panel:  Recent Labs Lab 09/11/16 0330  CHOL 179  TRIG 373*  HDL 34*  CHOLHDL 5.3  VLDL 75*  LDLCALC 70    CBG: No results for input(s): GLUCAP in the last 168 hours.  Microbiology: No results found for this or any previous visit.  Coagulation Studies:  Recent Labs  09/10/16 1416  LABPROT 12.5  INR 0.93    Imaging: Ct Angio Head W Or Wo Contrast  Result Date: 09/10/2016 CLINICAL DATA:  Slurred speech and headache EXAM: CT ANGIOGRAPHY HEAD AND NECK TECHNIQUE: Multidetector CT imaging of the head and neck was performed using the standard protocol during bolus administration of intravenous contrast. Multiplanar CT image reconstructions and MIPs were obtained to evaluate the vascular anatomy. Carotid stenosis measurements (when applicable) are obtained utilizing NASCET criteria, using the distal internal carotid diameter as the denominator. CONTRAST:  75 mL Isovue 370 COMPARISON:  Head CT 09/10/2016 FINDINGS: CTA NECK FINDINGS Aortic arch: There is  no aneurysm or dissection of the visualized ascending aorta or aortic arch. There is a normal variant aortic arch branching pattern with the brachiocephalic and left common carotid arteries sharing a common origin. The visualized proximal subclavian arteries are normal. Right carotid system: The right common carotid origin is widely patent. There is no common carotid or internal carotid artery dissection or aneurysm. No hemodynamically significant stenosis. Left carotid system: The left common carotid origin is widely patent. There is no common carotid or  internal carotid artery dissection or aneurysm. No hemodynamically significant stenosis. Vertebral arteries: The vertebral system is left dominant. Both vertebral artery origins are normal. Both vertebral arteries are normal to their confluence with the basilar artery. Skeleton: Status post C5-6 ACDF.  No bony spinal canal stenosis. Other neck: The nasopharynx is clear. The oropharynx and hypopharynx are normal. The epiglottis is normal. The supraglottic larynx, glottis and subglottic larynx are normal. No retropharyngeal collection. The parapharyngeal spaces are preserved. The parotid and submandibular glands are normal. No sialolithiasis or salivary ductal dilatation. The thyroid gland is normal. There is no cervical lymphadenopathy. Upper chest: No pneumothorax or pleural effusion. No nodules or masses. Review of the MIP images confirms the above findings CTA HEAD FINDINGS Anterior circulation: --Intracranial internal carotid arteries: Minimal atherosclerotic calcification. No stenosis. 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 --Middle cerebral arteries: Normal. --Posterior communicating arteries: Absent bilaterally. Posterior circulation: --Posterior cerebral arteries: Normal. --Superior cerebellar arteries: Normal. --Basilar artery: Normal. --Anterior inferior cerebellar arteries: Normal. --Posterior inferior cerebellar arteries: Normal. Venous sinuses: As permitted by contrast timing, patent. 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 Delayed phase: No parenchymal contrast enhancement. Review of the MIP images confirms the above findings IMPRESSION: Normal CTA of the head and neck without occlusion, dissection or hemodynamically significant stenosis. Electronically Signed   By: Deatra Robinson M.D.   On: 09/10/2016 16:03   Ct Angio Neck W Or Wo Contrast  Result Date: 09/10/2016 CLINICAL DATA:  Slurred speech and  headache EXAM: CT ANGIOGRAPHY HEAD AND NECK TECHNIQUE: Multidetector CT imaging of the head and neck was performed using the standard protocol during bolus administration of intravenous contrast. Multiplanar CT image reconstructions and MIPs were obtained to evaluate the vascular anatomy. Carotid stenosis measurements (when applicable) are obtained utilizing NASCET criteria, using the distal internal carotid diameter as the denominator. CONTRAST:  75 mL Isovue 370 COMPARISON:  Head CT 09/10/2016 FINDINGS: CTA NECK FINDINGS Aortic arch: There is no aneurysm or dissection of the visualized ascending aorta or aortic arch. There is a normal variant aortic arch branching pattern with the brachiocephalic and left common carotid arteries sharing a common origin. The visualized proximal subclavian arteries are normal. Right carotid system: The right common carotid origin is widely patent. There is no common carotid or internal carotid artery dissection or aneurysm. No hemodynamically significant stenosis. Left carotid system: The left common carotid origin is widely patent. There is no common carotid or internal carotid artery dissection or aneurysm. No hemodynamically significant stenosis. Vertebral arteries: The vertebral system is left dominant. Both vertebral artery origins are normal. Both vertebral arteries are normal to their confluence with the basilar artery. Skeleton: Status post C5-6 ACDF.  No bony spinal canal stenosis. Other neck: The nasopharynx is clear. The oropharynx and hypopharynx are normal. The epiglottis is normal. The supraglottic larynx, glottis and subglottic larynx are normal. No retropharyngeal collection. The parapharyngeal spaces are preserved. The parotid and submandibular glands are  normal. No sialolithiasis or salivary ductal dilatation. The thyroid gland is normal. There is no cervical lymphadenopathy. Upper chest: No pneumothorax or pleural effusion. No nodules or masses. Review of the MIP  images confirms the above findings CTA HEAD FINDINGS Anterior circulation: --Intracranial internal carotid arteries: Minimal atherosclerotic calcification. No stenosis. 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 --Middle cerebral arteries: Normal. --Posterior communicating arteries: Absent bilaterally. Posterior circulation: --Posterior cerebral arteries: Normal. --Superior cerebellar arteries: Normal. --Basilar artery: Normal. --Anterior inferior cerebellar arteries: Normal. --Posterior inferior cerebellar arteries: Normal. Venous sinuses: As permitted by contrast timing, patent. 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 Delayed phase: No parenchymal contrast enhancement. Review of the MIP images confirms the above findings IMPRESSION: Normal CTA of the head and neck without occlusion, dissection or hemodynamically significant stenosis. Electronically Signed   By: Deatra Robinson M.D.   On: 09/10/2016 16:03   Ct Head Code Stroke Wo Contrast`  Result Date: 09/10/2016 CLINICAL DATA:  Code stroke. 76 y/o F; slurred speech and headache. EXAM: CT HEAD WITHOUT CONTRAST TECHNIQUE: Contiguous axial images were obtained from the base of the skull through the vertex without intravenous contrast. COMPARISON:  None. FINDINGS: Brain: No evidence of large acute infarction, hemorrhage, hydrocephalus, extra-axial collection or mass lesion/mass effect. Few nonspecific foci of hypoattenuation in subcortical white matter are compatible with mild chronic microvascular ischemic changes for age. Mild brain parenchymal volume loss. Vascular: Mild calcific atherosclerosis of carotid siphons. No hyperdense vessel identified. Skull: Normal. Negative for fracture or focal lesion. Sinuses/Orbits: No acute finding. Other: None. ASPECTS [facility] Stroke Program Early CT Score) - Ganglionic level infarction (caudate, lentiform nuclei, internal  capsule, insula, M1-M3 cortex): 7 - Supraganglionic infarction (M4-M6 cortex): 3 Total score (0-10 with 10 being normal): 10 IMPRESSION: 1. No acute intracranial abnormality identified. 2. Mild for age chronic microvascular ischemic changes and mild parenchymal volume loss of the brain. 3. ASPECTS is 10 These results were called by telephone at the time of interpretation on 09/10/2016 at 2:18 pm to Dr. Merrily Brittle , who verbally acknowledged these results. Electronically Signed   By: Mitzi Hansen M.D.   On: 09/10/2016 14:18    Medications:  I have reviewed the patient's current medications. Scheduled: . aspirin EC  81 mg Oral Daily  . citalopram  40 mg Oral Daily  . clonazePAM  0.75 mg Oral QHS  . DULoxetine  40 mg Oral Daily  . enoxaparin (LOVENOX) injection  40 mg Subcutaneous Q24H  . gabapentin  100 mg Oral BID  . pravastatin  10 mg Oral Daily  . QUEtiapine  50 mg Oral QHS  . sodium chloride flush  3 mL Intravenous Q12H  . traZODone  150 mg Oral QHS  . triamterene-hydrochlorothiazide  1 capsule Oral Daily    Assessment/Plan: Patient back to baseline.  Suspect TIA.  MRI and echocardiogram are pending.  CTA of the head and neck are normal.  A1c 5.4, LDL 70.    Recommendations: 1.  Continue ASA at 81mg  daily and add Plavix 75mg  daily.   2.  Continue statin therapy.   3.  No further neurologic intervention is recommended at this time.  If further questions arise, please call or page at that time.  Thank you for allowing neurology to participate in the care of this patient.  Patient to follow up with neurology on an outpatient basis.      LOS: 0 days   Thana Farr, MD  Neurology [phone] 09/11/2016  11:00 AM

## 2016-10-15 ENCOUNTER — Encounter: Payer: Self-pay | Admitting: Physician Assistant

## 2016-10-15 ENCOUNTER — Ambulatory Visit (INDEPENDENT_AMBULATORY_CARE_PROVIDER_SITE_OTHER): Payer: Medicare Other | Admitting: Physician Assistant

## 2016-10-15 VITALS — BP 119/69 | HR 58 | Temp 98.0°F | Resp 18 | Ht 65.25 in | Wt 142.4 lb

## 2016-10-15 DIAGNOSIS — F411 Generalized anxiety disorder: Secondary | ICD-10-CM

## 2016-10-15 DIAGNOSIS — Z8673 Personal history of transient ischemic attack (TIA), and cerebral infarction without residual deficits: Secondary | ICD-10-CM | POA: Diagnosis not present

## 2016-10-15 DIAGNOSIS — G8929 Other chronic pain: Secondary | ICD-10-CM | POA: Diagnosis not present

## 2016-10-15 DIAGNOSIS — I639 Cerebral infarction, unspecified: Secondary | ICD-10-CM

## 2016-10-15 NOTE — Patient Instructions (Addendum)
  Increase the gabapentin to 200 mg in the morning, and 200 at night for pain.   Please keep you phone close for the neurology referral.   Increase the celexa to 40 mg, or one tab daily.     IF you received an x-ray today, you will receive an invoice from [facility]. Please contact [facility] at [phone] with questions or concerns regarding your invoice.   IF you received labwork today, you will receive an invoice from [place]. Please contact [facility] at [phone] with questions or concerns regarding your invoice.   Our billing staff will not be able to assist you with questions regarding bills from these companies.  You will be contacted with the lab results as soon as they are available. The fastest way to get your results is to activate your My Chart account. Instructions are located on the last page of this paperwork. If you have not heard from [place] regarding the results in 2 weeks, please contact this office.

## 2016-10-15 NOTE — Progress Notes (Signed)
10/29/2016 2:00 PM   DOB: [birth year] / MRN: [id number]  SUBJECTIVE:  Maria Horton is a 76 y.o. female presenting to establish care.  Tells me that her doctor recently stopped her klonopin, trazodone, and klonopin.  She would like to be put back on her klonopin and she was taking 0.5 mg of klonopin.  She is taking Celexa 20 mg.   Right now her sleep is highly variably. Stays up late and watches TV.  Takes meds at 10:30.    Tells me that she has vascular dementia. Did have a stroke recently and was told this was a TIA.   Take Norco for pain occasionaly. Also taking gabapentin.   She is allergic to ace inhibitors; beta adrenergic blockers; calcium channel blockers; and tape.   She  has a past medical history of Anxiety; Chronic back pain; Constipation; Depression; Diverticulosis; Fibromyalgia; Gastroparesis; Hypertension; Hypoglycemia; Hypokalemia; Osteoarthritis; Reflux; TIA (transient ischemic attack); Vascular dementia; and Vitamin D deficiency.    She  reports that she has never smoked. She has never used smokeless tobacco. She reports that she does not drink alcohol or use drugs. She  has no sexual activity history on file. The patient  has a past surgical history that includes Neck surgery; Rotator cuff repair; Cholecystectomy; Tonsillectomy; Abdominal hysterectomy; and Colon surgery.  Her family history includes Hypertension in her mother.  Review of Systems  Constitutional: Negative for chills and fever.  HENT: Negative for sinus pain.   Skin: Negative for itching and rash.    The problem list and medications were reviewed and updated by myself where necessary and exist elsewhere in the encounter.   OBJECTIVE:  BP 119/69 (BP Location: Right Arm, Patient Position: Sitting, Cuff Size: Normal)   Pulse (!) 58   Temp 98 F (36.7 C) (Oral)   Resp 18   Ht 5' 5.25" (1.657 m)   Wt 142 lb 6.4 oz (64.6 kg)   SpO2 97%   BMI 23.52 kg/m   Physical Exam  Constitutional: She is  oriented to person, place, and time. She is active.  Non-toxic appearance.  Eyes: Pupils are equal, round, and reactive to light. EOM are normal.  Cardiovascular: Normal rate, regular rhythm, S1 normal, S2 normal, normal heart sounds and intact distal pulses.  Exam reveals no gallop, no friction rub and no decreased pulses.   No murmur heard. Pulmonary/Chest: Effort normal. No stridor. No tachypnea. No respiratory distress. She has no wheezes. She has no rales.  Abdominal: She exhibits no distension.  Musculoskeletal: She exhibits no edema.  Neurological: She is alert and oriented to person, place, and time. She has normal strength and normal reflexes. She is not disoriented. She displays no atrophy. No cranial nerve deficit or sensory deficit. She exhibits normal muscle tone. Coordination and gait normal.  Skin: Skin is warm and dry. She is not diaphoretic. No pallor.  Psychiatric: Her behavior is normal.    No results found for this or any previous visit (from the past 72 hour(s)).  No results found.  ASSESSMENT AND PLAN:  Maria Horton was seen today for establish care.  Diagnoses and all orders for this visit:  History of CVA (cerebrovascular accident): Records are available in epic.  Will get her in with neuro.   Comments: Taking plavix and pravastatin at this time.  Orders: -     Basic Metabolic Panel  Anxiety state: She left her doctor in [place] because he stopped her trazadone and klonopin.  She wan't  to be back on her klonopin.  I have advised that I will not prescribe this for daily therapy.  She is understanding.   Comments: Increasing Celexa to 40 mg daily.   Other chronic pain Comments: Doubling her gabapentin.     The patient is advised to call or return to clinic if she does not see an improvement in symptoms, or to seek the care of the closest emergency department if she worsens with the above plan.   [name], MHS, PA-C Primary Care at [facility] 10/29/2016 2:00 PM

## 2016-10-16 LAB — BASIC METABOLIC PANEL
BUN / CREAT RATIO: 14 (ref 12–28)
BUN: 15 mg/dL (ref 8–27)
CO2: 25 mmol/L (ref 20–29)
CREATININE: 1.11 mg/dL — AB (ref 0.57–1.00)
Calcium: 9.7 mg/dL (ref 8.7–10.3)
Chloride: 102 mmol/L (ref 96–106)
GFR, EST AFRICAN AMERICAN: 57 mL/min/{1.73_m2} — AB (ref >59)
GFR, EST NON AFRICAN AMERICAN: 49 mL/min/{1.73_m2} — AB (ref >59)
Glucose: 80 mg/dL (ref 65–99)
Potassium: 3.7 mmol/L (ref 3.5–5.2)
SODIUM: 145 mmol/L — AB (ref 134–144)

## 2016-10-29 ENCOUNTER — Ambulatory Visit (INDEPENDENT_AMBULATORY_CARE_PROVIDER_SITE_OTHER): Payer: Medicare Other | Admitting: Physician Assistant

## 2016-10-29 ENCOUNTER — Encounter: Payer: Self-pay | Admitting: Physician Assistant

## 2016-10-29 VITALS — BP 116/74 | HR 58 | Resp 16 | Ht 65.25 in | Wt 143.4 lb

## 2016-10-29 DIAGNOSIS — F321 Major depressive disorder, single episode, moderate: Secondary | ICD-10-CM

## 2016-10-29 DIAGNOSIS — I639 Cerebral infarction, unspecified: Secondary | ICD-10-CM

## 2016-10-29 NOTE — Progress Notes (Signed)
    10/29/2016 2:16 PM   DOB: 1940/11/18 / MRN: [id number]  SUBJECTIVE:  Maria Horton is a 76 y.o. female presenting for follow up.  Last we spoke she complained of mostly anxiety and wanted to be placed back on her klonopin.  This was refused as a daily therapy.  We opted to increase her Celexa to 40 mg. With regard to this she tells me this has not made much difference.   She complained of a history of chronic pain. She was taking a very low dose of Gabapentin and this was also doubled.  With regard to this she tells me she forgot to increase this.   She is allergic to ace inhibitors; beta adrenergic blockers; calcium channel blockers; and tape.   She  has a past medical history of Anxiety; Chronic back pain; Constipation; Depression; Diverticulosis; Fibromyalgia; Gastroparesis; Hypertension; Hypoglycemia; Hypokalemia; Osteoarthritis; Reflux; TIA (transient ischemic attack); Vascular dementia; and Vitamin D deficiency.    She  reports that she has never smoked. She has never used smokeless tobacco. She reports that she does not drink alcohol or use drugs. She  has no sexual activity history on file. The patient  has a past surgical history that includes Neck surgery; Rotator cuff repair; Cholecystectomy; Tonsillectomy; Abdominal hysterectomy; and Colon surgery.  Her family history includes Hypertension in her mother.  Review of Systems  Constitutional: Negative for chills, diaphoresis and fever.  Eyes: Negative.   Respiratory: Negative for shortness of breath.   Cardiovascular: Negative for chest pain, orthopnea and leg swelling.  Gastrointestinal: Negative for nausea.  Skin: Negative for rash.  Neurological: Negative for dizziness, sensory change, speech change, focal weakness and headaches.    The problem list and medications were reviewed and updated by myself where necessary and exist elsewhere in the encounter.   OBJECTIVE:  BP 116/74 (BP Location: Left Arm, Patient Position:  Sitting, Cuff Size: Normal)   Pulse (!) 58   Resp 16   Ht 5' 5.25" (1.657 m)   Wt 143 lb 6.4 oz (65 kg)   SpO2 97%   BMI 23.68 kg/m   Physical Exam  Constitutional: She is active.  Non-toxic appearance.  Cardiovascular: Normal rate, regular rhythm, S1 normal, S2 normal, normal heart sounds and intact distal pulses.  Exam reveals no gallop, no friction rub and no decreased pulses.   No murmur heard. Pulmonary/Chest: Effort normal. No tachypnea. She has no rales.  Abdominal: She exhibits no distension.  Musculoskeletal: She exhibits no edema.  Neurological: She is alert.  Skin: Skin is warm and dry. She is not diaphoretic. No pallor.    No results found for this or any previous visit (from the past 72 hour(s)).  No results found.  ASSESSMENT AND PLAN:  Maria Horton was seen today for follow-up.  Diagnoses and all orders for this visit:  Current moderate episode of major depressive disorder without prior episode (HCC) Comments: She will continue celexa 40.  She is now receiving Klonopin from her neuropsychiatrist.  She plans to find primary care elsewhere. I have wished her the best.     The patient is advised to call or return to clinic if she does not see an improvement in symptoms, or to seek the care of the closest emergency department if she worsens with the above plan.   Maria Horton, MHS, PA-C Primary Care at [facility] 10/29/2016 2:16 PM

## 2016-10-29 NOTE — Patient Instructions (Addendum)
It was nice meeting you [facility]. I certainly understand you wanting to see a provider closer to your home.  I am happy to see you back anytime you need me.       IF you received an x-ray today, you will receive an invoice from [facility]. Please contact [facility] at [phone] with questions or concerns regarding your invoice.   IF you received labwork today, you will receive an invoice from [place]. Please contact [facility] at [phone] with questions or concerns regarding your invoice.   Our billing staff will not be able to assist you with questions regarding bills from these companies.  You will be contacted with the lab results as soon as they are available. The fastest way to get your results is to activate your My Chart account. Instructions are located on the last page of this paperwork. If you have not heard from [place] regarding the results in 2 weeks, please contact this office.

## 2016-11-14 ENCOUNTER — Inpatient Hospital Stay
Admission: EM | Admit: 2016-11-14 | Discharge: 2016-11-17 | DRG: 065 | Disposition: A | Discharge status: Home Health | Payer: Medicare Other | Attending: Internal Medicine | Admitting: Internal Medicine

## 2016-11-14 ENCOUNTER — Emergency Department: Payer: Medicare Other

## 2016-11-14 ENCOUNTER — Encounter: Payer: Self-pay | Admitting: Emergency Medicine

## 2016-11-14 ENCOUNTER — Observation Stay: Payer: Medicare Other

## 2016-11-14 DIAGNOSIS — K3184 Gastroparesis: Secondary | ICD-10-CM | POA: Diagnosis present

## 2016-11-14 DIAGNOSIS — Y92009 Unspecified place in unspecified non-institutional (private) residence as the place of occurrence of the external cause: Secondary | ICD-10-CM

## 2016-11-14 DIAGNOSIS — G459 Transient cerebral ischemic attack, unspecified: Secondary | ICD-10-CM | POA: Diagnosis present

## 2016-11-14 DIAGNOSIS — M797 Fibromyalgia: Secondary | ICD-10-CM | POA: Diagnosis present

## 2016-11-14 DIAGNOSIS — I639 Cerebral infarction, unspecified: Principal | ICD-10-CM | POA: Diagnosis present

## 2016-11-14 DIAGNOSIS — F419 Anxiety disorder, unspecified: Secondary | ICD-10-CM | POA: Diagnosis present

## 2016-11-14 DIAGNOSIS — R55 Syncope and collapse: Secondary | ICD-10-CM

## 2016-11-14 DIAGNOSIS — E785 Hyperlipidemia, unspecified: Secondary | ICD-10-CM | POA: Diagnosis present

## 2016-11-14 DIAGNOSIS — Z79899 Other long term (current) drug therapy: Secondary | ICD-10-CM

## 2016-11-14 DIAGNOSIS — F329 Major depressive disorder, single episode, unspecified: Secondary | ICD-10-CM | POA: Diagnosis present

## 2016-11-14 DIAGNOSIS — K219 Gastro-esophageal reflux disease without esophagitis: Secondary | ICD-10-CM | POA: Diagnosis present

## 2016-11-14 DIAGNOSIS — N189 Chronic kidney disease, unspecified: Secondary | ICD-10-CM | POA: Diagnosis present

## 2016-11-14 DIAGNOSIS — I129 Hypertensive chronic kidney disease with stage 1 through stage 4 chronic kidney disease, or unspecified chronic kidney disease: Secondary | ICD-10-CM | POA: Diagnosis present

## 2016-11-14 DIAGNOSIS — R001 Bradycardia, unspecified: Secondary | ICD-10-CM | POA: Diagnosis present

## 2016-11-14 DIAGNOSIS — R4701 Aphasia: Secondary | ICD-10-CM | POA: Diagnosis present

## 2016-11-14 DIAGNOSIS — Z888 Allergy status to other drugs, medicaments and biological substances status: Secondary | ICD-10-CM

## 2016-11-14 DIAGNOSIS — R29700 NIHSS score 0: Secondary | ICD-10-CM | POA: Diagnosis present

## 2016-11-14 DIAGNOSIS — N179 Acute kidney failure, unspecified: Secondary | ICD-10-CM

## 2016-11-14 DIAGNOSIS — F0150 Vascular dementia without behavioral disturbance: Secondary | ICD-10-CM | POA: Diagnosis present

## 2016-11-14 DIAGNOSIS — Z8673 Personal history of transient ischemic attack (TIA), and cerebral infarction without residual deficits: Secondary | ICD-10-CM

## 2016-11-14 DIAGNOSIS — Z7902 Long term (current) use of antithrombotics/antiplatelets: Secondary | ICD-10-CM

## 2016-11-14 DIAGNOSIS — W19XXXA Unspecified fall, initial encounter: Secondary | ICD-10-CM

## 2016-11-14 DIAGNOSIS — I959 Hypotension, unspecified: Secondary | ICD-10-CM | POA: Diagnosis present

## 2016-11-14 HISTORY — DX: Cerebral infarction, unspecified: I63.9

## 2016-11-14 LAB — APTT: aPTT: 31 seconds (ref 24–36)

## 2016-11-14 LAB — URINALYSIS, COMPLETE (UACMP) WITH MICROSCOPIC
Bilirubin Urine: NEGATIVE
Glucose, UA: NEGATIVE mg/dL
Hgb urine dipstick: NEGATIVE
Ketones, ur: NEGATIVE mg/dL
Leukocytes, UA: NEGATIVE
Nitrite: NEGATIVE
PROTEIN: NEGATIVE mg/dL
Specific Gravity, Urine: 1.009 (ref 1.005–1.030)
pH: 6 (ref 5.0–8.0)

## 2016-11-14 LAB — DIFFERENTIAL
BASOS ABS: 0 10*3/uL (ref 0–0.1)
BASOS PCT: 1 %
Eosinophils Absolute: 0 10*3/uL (ref 0–0.7)
Eosinophils Relative: 1 %
Lymphocytes Relative: 34 %
Lymphs Abs: 1.8 10*3/uL (ref 1.0–3.6)
MONOS PCT: 12 %
Monocytes Absolute: 0.6 10*3/uL (ref 0.2–0.9)
NEUTROS ABS: 2.9 10*3/uL (ref 1.4–6.5)
NEUTROS PCT: 54 %

## 2016-11-14 LAB — COMPREHENSIVE METABOLIC PANEL
ALT: 9 U/L — ABNORMAL LOW (ref 14–54)
ANION GAP: 7 (ref 5–15)
AST: 24 U/L (ref 15–41)
Albumin: 3.7 g/dL (ref 3.5–5.0)
Alkaline Phosphatase: 42 U/L (ref 38–126)
BILIRUBIN TOTAL: 1 mg/dL (ref 0.3–1.2)
BUN: 21 mg/dL — AB (ref 6–20)
CO2: 26 mmol/L (ref 22–32)
Calcium: 9.1 mg/dL (ref 8.9–10.3)
Chloride: 105 mmol/L (ref 101–111)
Creatinine, Ser: 1.31 mg/dL — ABNORMAL HIGH (ref 0.44–1.00)
GFR calc Af Amer: 45 mL/min — ABNORMAL LOW (ref >60)
GFR, EST NON AFRICAN AMERICAN: 39 mL/min — AB (ref >60)
Glucose, Bld: 121 mg/dL — ABNORMAL HIGH (ref 65–99)
POTASSIUM: 3.5 mmol/L (ref 3.5–5.1)
Sodium: 138 mmol/L (ref 135–145)
TOTAL PROTEIN: 6.5 g/dL (ref 6.5–8.1)

## 2016-11-14 LAB — CBC
HEMATOCRIT: 41.2 % (ref 35.0–47.0)
Hemoglobin: 13.9 g/dL (ref 12.0–16.0)
MCH: 31.2 pg (ref 26.0–34.0)
MCHC: 33.7 g/dL (ref 32.0–36.0)
MCV: 92.6 fL (ref 80.0–100.0)
Platelets: 171 10*3/uL (ref 150–440)
RBC: 4.45 MIL/uL (ref 3.80–5.20)
RDW: 14.5 % (ref 11.5–14.5)
WBC: 5.4 10*3/uL (ref 3.6–11.0)

## 2016-11-14 LAB — URINE DRUG SCREEN, QUALITATIVE (ARMC ONLY)
AMPHETAMINES, UR SCREEN: NOT DETECTED
BENZODIAZEPINE, UR SCRN: NOT DETECTED
Barbiturates, Ur Screen: NOT DETECTED
Cannabinoid 50 Ng, Ur ~~LOC~~: NOT DETECTED
Cocaine Metabolite,Ur ~~LOC~~: NOT DETECTED
MDMA (Ecstasy)Ur Screen: NOT DETECTED
METHADONE SCREEN, URINE: NOT DETECTED
Opiate, Ur Screen: NOT DETECTED
Phencyclidine (PCP) Ur S: NOT DETECTED
TRICYCLIC, UR SCREEN: NOT DETECTED

## 2016-11-14 LAB — MAGNESIUM: MAGNESIUM: 2 mg/dL (ref 1.7–2.4)

## 2016-11-14 LAB — ETHANOL: Alcohol, Ethyl (B): <10 mg/dL (ref <10)

## 2016-11-14 LAB — PROTIME-INR
INR: 1.01
PROTHROMBIN TIME: 13.2 s (ref 11.4–15.2)

## 2016-11-14 LAB — TROPONIN I

## 2016-11-14 MED ORDER — HEPARIN SODIUM (PORCINE) 5000 UNIT/ML IJ SOLN
5000.0000 [IU] | Freq: Three times a day (TID) | INTRAMUSCULAR | Status: DC
  Administered 2016-11-14 – 2016-11-17 (×8): 5000 [IU] via SUBCUTANEOUS
  Filled 2016-11-14 (×8): qty 1

## 2016-11-14 MED ORDER — ACETAMINOPHEN 325 MG PO TABS
650.0000 mg | ORAL_TABLET | ORAL | Status: DC | PRN
  Administered 2016-11-15 – 2016-11-16 (×2): 650 mg via ORAL
  Filled 2016-11-14 (×2): qty 2

## 2016-11-14 MED ORDER — ACETAMINOPHEN 650 MG RE SUPP: 650.0000 mg | RECTAL | Status: DC | PRN

## 2016-11-14 MED ORDER — GABAPENTIN 100 MG PO CAPS
100.0000 mg | ORAL_CAPSULE | Freq: Two times a day (BID) | ORAL | Status: DC
  Administered 2016-11-14 – 2016-11-17 (×6): 100 mg via ORAL
  Filled 2016-11-14 (×6): qty 1

## 2016-11-14 MED ORDER — PRAVASTATIN SODIUM 10 MG PO TABS
10.0000 mg | ORAL_TABLET | Freq: Every evening | ORAL | Status: DC
  Administered 2016-11-14 – 2016-11-15 (×2): 10 mg via ORAL
  Filled 2016-11-14 (×3): qty 1

## 2016-11-14 MED ORDER — ALBUTEROL SULFATE (2.5 MG/3ML) 0.083% IN NEBU: 3.0000 mL | INHALATION_SOLUTION | Freq: Four times a day (QID) | RESPIRATORY_TRACT | Status: DC | PRN

## 2016-11-14 MED ORDER — FLUTICASONE PROPIONATE 50 MCG/ACT NA SUSP
2.0000 | Freq: Every day | NASAL | Status: DC | PRN
  Filled 2016-11-14: qty 16

## 2016-11-14 MED ORDER — SODIUM CHLORIDE 0.9 % IV BOLUS (SEPSIS)
1000.0000 mL | Freq: Once | INTRAVENOUS | Status: AC
  Administered 2016-11-14: 1000 mL via INTRAVENOUS

## 2016-11-14 MED ORDER — SODIUM CHLORIDE 0.9 % IV SOLN
INTRAVENOUS | Status: DC
  Administered 2016-11-14 – 2016-11-15 (×2): via INTRAVENOUS

## 2016-11-14 MED ORDER — DIPHENHYDRAMINE HCL 25 MG PO CAPS
25.0000 mg | ORAL_CAPSULE | Freq: Every evening | ORAL | Status: DC | PRN
  Administered 2016-11-14: 25 mg via ORAL
  Filled 2016-11-14 (×2): qty 1

## 2016-11-14 MED ORDER — SENNOSIDES-DOCUSATE SODIUM 8.6-50 MG PO TABS: 1.0000 | ORAL_TABLET | Freq: Every evening | ORAL | Status: DC | PRN

## 2016-11-14 MED ORDER — ACETAMINOPHEN 160 MG/5ML PO SOLN
650.0000 mg | ORAL | Status: DC | PRN
Start: 2016-11-14 — End: 2016-11-17
  Filled 2016-11-14: qty 20.3

## 2016-11-14 MED ORDER — STROKE: EARLY STAGES OF RECOVERY BOOK
Freq: Once | Status: AC
  Administered 2016-11-14: 22:00:00

## 2016-11-14 MED ORDER — ASPIRIN 81 MG PO CHEW
324.0000 mg | CHEWABLE_TABLET | Freq: Once | ORAL | Status: AC
  Administered 2016-11-14: 324 mg via ORAL
  Filled 2016-11-14: qty 4

## 2016-11-14 MED ORDER — CITALOPRAM HYDROBROMIDE 20 MG PO TABS
20.0000 mg | ORAL_TABLET | Freq: Every day | ORAL | Status: DC
  Administered 2016-11-15 – 2016-11-17 (×3): 20 mg via ORAL
  Filled 2016-11-14 (×3): qty 1

## 2016-11-14 MED ORDER — CLOPIDOGREL BISULFATE 75 MG PO TABS
75.0000 mg | ORAL_TABLET | Freq: Every day | ORAL | Status: DC
  Administered 2016-11-15 – 2016-11-17 (×3): 75 mg via ORAL
  Filled 2016-11-14 (×3): qty 1

## 2016-11-14 NOTE — Progress Notes (Signed)
CH responded to a PG for a Code Stroke. Pt was being assessed by the Medical staff. No family was present. Pt asked that I call her daughter and inform her of the situation. CH contacted daughter and is available for follow up if needed.    11/14/16 1900  Clinical Encounter Type  Visited With Patient;Health care provider  Visit Type Initial;Spiritual support;Code;ED (Code Stroke)  Referral From Nurse  Consult/Referral To Chaplain  Spiritual Encounters  Spiritual Needs Emotional

## 2016-11-14 NOTE — ED Notes (Signed)
Returned from CT with patient 

## 2016-11-14 NOTE — ED Provider Notes (Signed)
[facility] Emergency Department Provider Note  ____________________________________________  Time seen: Approximately 7:07 PM  I have reviewed the triage vital signs and the nursing notes.   HISTORY  Chief Complaint Near Syncope  Level 5 caveat:  Portions of the history and physical were unable to be obtained due to expressive aphasia   HPI Maria Horton is a 76 y.o. female with h/o CVA on 08/2016 on Plavix who presents for fall vs syncope. Patient with expressive aphasia on arrival which started 35 min PTA. Patient reports that she fell. Unable to provide any further history.patient is able to understand what I am asking her and shakes her head yes that the aphasia is new and this is similar to her presentation for her stroke back in August. When asked if she is feeling weak in one side of her body she says yes but is unable to tell me or show me which side. She also shakes her head yes for headache but unable to give me any more details.  Past Medical History:  Diagnosis Date  . Anxiety   . Chronic back pain   . Constipation   . Depression   . Diverticulosis   . Fibromyalgia   . Gastroparesis   . Hypertension   . Hypoglycemia   . Hypokalemia   . Osteoarthritis   . Reflux   . TIA (transient ischemic attack)   . Vascular dementia   . Vitamin D deficiency     Patient Active Problem List   Diagnosis Date Noted  . TIA (transient ischemic attack) 09/10/2016  . Chronic pain syndrome 09/30/2015  . GERD (gastroesophageal reflux disease) 09/29/2015  . Severe recurrent major depression without psychotic features (HCC) 09/26/2015  . Fibromyalgia 09/26/2015  . Osteoarthritis 09/26/2015  . Hypertension 09/26/2015    Past Surgical History:  Procedure Laterality Date  . ABDOMINAL HYSTERECTOMY    . CHOLECYSTECTOMY    . COLON SURGERY    . NECK SURGERY    . ROTATOR CUFF REPAIR    . TONSILLECTOMY      Prior to Admission medications   Medication  Sig Start Date End Date Taking? Authorizing Provider  albuterol (PROVENTIL HFA;VENTOLIN HFA) 108 (90 Base) MCG/ACT inhaler Inhale 2 puffs into the lungs every 6 (six) hours as needed for wheezing or shortness of breath.    [provider]  citalopram (CELEXA) 40 MG tablet Take 0.5 tablets (20 mg total) by mouth daily. 09/11/16   Adrian Saran, MD  clonazePAM (KLONOPIN) 0.5 MG tablet Take 1.5 tablets (0.75 mg total) by mouth at bedtime. 10/01/15   Pucilowska, Braulio Conte B, MD  clopidogrel (PLAVIX) 75 MG tablet Take 1 tablet (75 mg total) by mouth daily. 09/11/16 09/11/17  Adrian Saran, MD  fluticasone (FLONASE) 50 MCG/ACT nasal spray Place 2 sprays into both nostrils daily as needed for allergies or rhinitis.    [provider]  gabapentin (NEURONTIN) 100 MG capsule Take 100 mg by mouth 2 (two) times daily. 09/02/16   [provider]  HYDROcodone-acetaminophen (NORCO/VICODIN) 5-325 MG tablet Take 1-2 tablets by mouth every 8 (eight) hours as needed for moderate pain.     [provider]  ondansetron (ZOFRAN) 4 MG tablet Take 4 mg by mouth every 8 (eight) hours as needed for nausea or vomiting.    [provider]  pravastatin (PRAVACHOL) 10 MG tablet Take 10 mg by mouth daily. 09/01/16   [provider]  traZODone (DESYREL) 150 MG tablet Take 1.5 tablets (225  mg total) by mouth at bedtime. 09/11/16   Adrian SaranMody, Sital, MD  triamterene-hydrochlorothiazide (DYAZIDE) 37.5-25 MG capsule Take 1 capsule by mouth daily. 06/22/16   [provider]    Allergies Ace inhibitors; Beta adrenergic blockers; Calcium channel blockers; and Tape  Family History  Problem Relation Age of Onset  . Hypertension Mother     Social History Social History  Substance Use Topics  . Smoking status: Never Smoker  . Smokeless tobacco: Never Used  . Alcohol use No    Review of Systems Neuro: + HA and expressive aphasia  Level 5 caveat:  Portions of the history and physical were  unable to be obtained due to expressive aphasia ____________________________________________   PHYSICAL EXAM:  VITAL SIGNS: ED Triage Vitals  Enc Vitals Group     BP --      Pulse --      Resp --      Temp 11/14/16 1853 98.3 F (36.8 C)     Temp Source 11/14/16 1853 Oral     SpO2 --      Weight 11/14/16 1854 140 lb (63.5 kg)     Height 11/14/16 1854 5\' 5"  (1.651 m)     Head Circumference --      Peak Flow --      Pain Score --      Pain Loc --      Pain Edu? --      Excl. in GC? --     Constitutional: Alert and oriented, no distress.  HEENT:      Head: Normocephalic and atraumatic.         Eyes: Conjunctivae are normal. Sclera is non-icteric.       Mouth/Throat: Mucous membranes are moist.       Neck: Supple with no signs of meningismus. Cardiovascular: Regular rate and rhythm. No murmurs, gallops, or rubs. 2+ symmetrical distal pulses are present in all extremities. No JVD. Respiratory: Normal respiratory effort. Lungs are clear to auscultation bilaterally. No wheezes, crackles, or rhonchi.  Gastrointestinal: Soft, non tender, and non distended with positive bowel sounds. No rebound or guarding. Musculoskeletal: Nontender with normal range of motion in all extremities. No edema, cyanosis, or erythema of extremities. Neurologic: Aphasic A & O x3, PERRL, EOMI, no nystagmus, CN II-XII intact, motor testing reveals good tone and bulk throughout. There is no evidence of pronator drift or dysmetria. Muscle strength is 5/5 throughout.  Sensory examination is intact. Gait deferred Skin: Skin is warm, dry and intact. No rash noted.   ____________________________________________   LABS (all labs ordered are listed, but only abnormal results are displayed)  Labs Reviewed  COMPREHENSIVE METABOLIC PANEL - Abnormal; Notable for the following:       Result Value   Glucose, Bld 121 (*)    BUN 21 (*)    Creatinine, Ser 1.31 (*)    ALT 9 (*)    GFR calc non Af Amer 39 (*)    GFR  calc Af Amer 45 (*)    All other components within normal limits  MAGNESIUM  TROPONIN I  URINALYSIS, COMPLETE (UACMP) WITH MICROSCOPIC  URINE DRUG SCREEN, QUALITATIVE ([facility] ONLY)  ETHANOL  PROTIME-INR  APTT  CBC  DIFFERENTIAL  CBG MONITORING, ED   ____________________________________________  EKG  ED ECG REPORT I, Nita Sicklearolina Macklen [place]ho[place], the attending physician, personally viewed and interpreted this ECG.  Sinus bradycardia, rate 42, prolonged QTC, normal axis, no ST elevations or depressions. Old EKG shows sinus bradycardia with a rate  in the mid to upper 50s  ____________________________________________  RADIOLOGY  Head CT:  1. No acute abnormality. Atrophy with chronic microvascular ischemia. ____________________________________________   PROCEDURES  Procedure(s) performed: None Procedures Critical Care performed: yes  CRITICAL CARE Performed by: Nita Sickle  ?  Total critical care time: 35 min  Critical care time was exclusive of separately billable procedures and treating other patients.  Critical care was necessary to treat or prevent imminent or life-threatening deterioration.  Critical care was time spent personally by me on the following activities: development of treatment plan with patient and/or surrogate as well as nursing, discussions with consultants, evaluation of patient's response to treatment, examination of patient, obtaining history from patient or surrogate, ordering and performing treatments and interventions, ordering and review of laboratory studies, ordering and review of radiographic studies, pulse oximetry and re-evaluation of patient's condition.  ____________________________________________   INITIAL IMPRESSION / ASSESSMENT AND PLAN / ED COURSE   76 y.o. female with h/o CVA on 08/2016 on Plavix who presents for new expressive aphasia. Last seen normal 6:20PM. On Plavix. review of medical records shows the patient's prior stroke back  in August presented with expressive aphasia as well. She has no other neuro deficits. A code stroke was called.patient found to be bradycardic with heart rate of 42. She is not on beta blocker or calcium channel blockers. Review of Epic shows the patient's heart rate is usually in the mid to upper 50s. Pacer pads placed. Electrolytes pending.    _________________________ 8:11 PM on 11/14/2016 -----------------------------------------  potassium of 3.5, mag is 2.0. Troponin is negative. Acute on chronic kidney injury. Patient's symptoms have resolved. She has been evaluated by tele neurology Dr. Bethann Berkshire who recommended ASA and admission. Patient needs to be evaluated for worsening bradycardia as well and AKI. Will give IVF and admit to Hospitalist.   As part of my medical decision making, I reviewed the following data within the electronic MEDICAL RECORD NUMBER Nursing notes reviewed and incorporated, Labs reviewed , EKG interpreted , Old EKG reviewed, Old chart reviewed, Radiograph reviewed , Discussed with admitting physician , A consult was requested and obtained from this/these consultant(s) Neurology, Notes from prior ED visits and [place] Controlled Substance Database    Pertinent labs & imaging results that were available during my care of the patient were reviewed by me and considered in my medical decision making (see chart for details).    ____________________________________________   FINAL CLINICAL IMPRESSION(S) / ED DIAGNOSES  Final diagnoses:  TIA (transient ischemic attack)  Expressive aphasia  Acute renal failure superimposed on chronic kidney disease, unspecified CKD stage, unspecified acute renal failure type (HCC)  Bradycardia      NEW MEDICATIONS STARTED DURING THIS VISIT:  New Prescriptions   No medications on file     Note:  This document was prepared using Dragon voice recognition software and may include unintentional dictation errors.    Nita Sickle,  MD 11/14/16 2013

## 2016-11-14 NOTE — ED Triage Notes (Signed)
Patient arrives from home with near syncope episode.  Patient denies LOC, but had a stroke last month she was tx for here.  Pt on Plavix.  Pt denies any pain from the fall.  Pt bradycardic @ 42P during transport [facility]

## 2016-11-14 NOTE — H&P (Signed)
[facility] [facility] [place] [facility]   PATIENT NAME: Maria Horton    MR#:  [id number]  DATE OF BIRTH:  March 23, 1940  DATE OF ADMISSION:  11/14/2016  PRIMARY CARE PHYSICIAN: Ofilia Neas, PA-C   REQUESTING/REFERRING PHYSICIAN: Don Perking  CHIEF COMPLAINT:   Chief Complaint  Patient presents with  . Near Syncope    HISTORY OF PRESENT ILLNESS: Maria Horton  is a 76 y.o. female with a known history of Htn, Stroke, Vascular dementia- Was fine until yesterday. Today felt very Imbalanced and was"bumpinf it to walls" while walking at home and had a fall due to loss of balance. Denies any Loss of consciousness, headache, Focal weakness, Numbness, No chest pain/ SOB/ New uinary symptoms.  Noted In ER- with some speech problems earlier when came, CT head negative and later improved in symptoms. Noted to have sinus bradycardia up to 40 and BP is in 90's systolic.  PAST MEDICAL HISTORY:   Past Medical History:  Diagnosis Date  . Anxiety   . Chronic back pain   . Constipation   . Depression   . Diverticulosis   . Fibromyalgia   . Gastroparesis   . Hypertension   . Hypoglycemia   . Hypokalemia   . Osteoarthritis   . Reflux   . Stroke (HCC)   . TIA (transient ischemic attack)   . Vascular dementia   . Vitamin D deficiency     PAST SURGICAL HISTORY: Past Surgical History:  Procedure Laterality Date  . ABDOMINAL HYSTERECTOMY    . CHOLECYSTECTOMY    . COLON SURGERY    . NECK SURGERY    . ROTATOR CUFF REPAIR    . TONSILLECTOMY      SOCIAL HISTORY:  Social History  Substance Use Topics  . Smoking status: Never Smoker  . Smokeless tobacco: Never Used  . Alcohol use No    FAMILY HISTORY:  Family History  Problem Relation Age of Onset  . Hypertension Mother     DRUG ALLERGIES:  Allergies  Allergen Reactions  . Ace Inhibitors Other (See Comments)    achy  . Beta Adrenergic Blockers Other (See Comments)    Makes achy/feel sick  . Calcium Channel  Blockers Other (See Comments)    Feels achy like sick  . Tape Rash    REVIEW OF SYSTEMS:   CONSTITUTIONAL: No fever, fatigue or weakness.  EYES: No blurred or double vision.  EARS, NOSE, AND THROAT: No tinnitus or ear pain.  RESPIRATORY: No cough, shortness of breath, wheezing or hemoptysis.  CARDIOVASCULAR: No chest pain, orthopnea, edema.  GASTROINTESTINAL: No nausea, vomiting, diarrhea or abdominal pain.  GENITOURINARY: No dysuria, hematuria.  ENDOCRINE: No polyuria, nocturia,  HEMATOLOGY: No anemia, easy bruising or bleeding SKIN: No rash or lesion. MUSCULOSKELETAL: No joint pain or arthritis.   NEUROLOGIC: No tingling, numbness, weakness.  PSYCHIATRY: No anxiety or depression.   MEDICATIONS AT HOME:  Prior to Admission medications   Medication Sig Start Date End Date Taking? Authorizing Provider  albuterol (PROVENTIL HFA;VENTOLIN HFA) 108 (90 Base) MCG/ACT inhaler Inhale 2 puffs into the lungs every 6 (six) hours as needed for wheezing or shortness of breath.    [provider]  citalopram (CELEXA) 40 MG tablet Take 0.5 tablets (20 mg total) by mouth daily. 09/11/16   Adrian Saran, MD  clonazePAM (KLONOPIN) 0.5 MG tablet Take 1.5 tablets (0.75 mg total) by mouth at bedtime. 10/01/15   Pucilowska, Braulio Conte B, MD  clopidogrel (PLAVIX) 75 MG tablet Take  1 tablet (75 mg total) by mouth daily. 09/11/16 09/11/17  Adrian SaranMody, Sital, MD  fluticasone (FLONASE) 50 MCG/ACT nasal spray Place 2 sprays into both nostrils daily as needed for allergies or rhinitis.    [provider]  gabapentin (NEURONTIN) 100 MG capsule Take 100 mg by mouth 2 (two) times daily. 09/02/16   [provider]  HYDROcodone-acetaminophen (NORCO/VICODIN) 5-325 MG tablet Take 1-2 tablets by mouth every 8 (eight) hours as needed for moderate pain.     [provider]  ondansetron (ZOFRAN) 4 MG tablet Take 4 mg by mouth every 8 (eight) hours as needed for nausea or vomiting.    [provider]  pravastatin (PRAVACHOL) 10 MG tablet Take 10 mg by mouth daily. 09/01/16   [provider]  traZODone (DESYREL) 150 MG tablet Take 1.5 tablets (225 mg total) by mouth at bedtime. 09/11/16   Adrian SaranMody, Sital, MD  triamterene-hydrochlorothiazide (DYAZIDE) 37.5-25 MG capsule Take 1 capsule by mouth daily. 06/22/16   [provider]      PHYSICAL EXAMINATION:   VITAL SIGNS: Blood pressure (!) 95/52, pulse (!) 42, temperature 98.4 F (36.9 C), resp. rate 15, height 5\' 5"  (1.651 m), weight 63.5 kg (140 lb), SpO2 97 %.  GENERAL:  76 y.o.-year-old patient lying in the bed with no acute distress.  EYES: Pupils equal, round, reactive to light and accommodation. No scleral icterus. Extraocular muscles intact.  HEENT: Head atraumatic, normocephalic. Oropharynx and nasopharynx clear.  NECK:  Supple, no jugular venous distention. No thyroid enlargement, no tenderness.  LUNGS: Normal breath sounds bilaterally, no wheezing, rales,rhonchi or crepitation. No use of accessory muscles of respiration.  CARDIOVASCULAR: S1, S2 normal, slow. No murmurs, rubs, or gallops.  ABDOMEN: Soft, nontender, nondistended. Bowel sounds present. No organomegaly or mass.  EXTREMITIES: No pedal edema, cyanosis, or clubbing.  NEUROLOGIC: Cranial nerves II through XII are intact. Muscle strength 5/5 in all extremities. Sensation intact. Gait not checked.  PSYCHIATRIC: The patient is alert and oriented x 3.  SKIN: No obvious rash, lesion, or ulcer.   LABORATORY PANEL:   CBC  Recent Labs Lab 11/14/16 1916  WBC 5.4  HGB 13.9  HCT 41.2  PLT 171  MCV 92.6  MCH 31.2  MCHC 33.7  RDW 14.5  LYMPHSABS 1.8  MONOABS 0.6  EOSABS 0.0  BASOSABS 0.0   ------------------------------------------------------------------------------------------------------------------  Chemistries   Recent Labs Lab 11/14/16 1916  NA 138  K 3.5  CL 105  CO2 26  GLUCOSE 121*  BUN 21*  CREATININE 1.31*  CALCIUM 9.1  MG 2.0   AST 24  ALT 9*  ALKPHOS 42  BILITOT 1.0   ------------------------------------------------------------------------------------------------------------------ estimated creatinine clearance is 33.9 mL/min (A) (by C-G formula based on SCr of 1.31 mg/dL (H)). ------------------------------------------------------------------------------------------------------------------ No results for input(s): TSH, T4TOTAL, T3FREE, THYROIDAB in the last 72 hours.  Invalid input(s): FREET3   Coagulation profile  Recent Labs Lab 11/14/16 1916  INR 1.01   ------------------------------------------------------------------------------------------------------------------- No results for input(s): DDIMER in the last 72 hours. -------------------------------------------------------------------------------------------------------------------  Cardiac Enzymes  Recent Labs Lab 11/14/16 1916  TROPONINI <0.03   ------------------------------------------------------------------------------------------------------------------ Invalid input(s): POCBNP  ---------------------------------------------------------------------------------------------------------------  Urinalysis    Component Value Date/Time   COLORURINE YELLOW (A) 09/10/2016 1658   APPEARANCEUR CLEAR (A) 09/10/2016 1658   LABSPEC >1.046 (H) 09/10/2016 1658   PHURINE 7.0 09/10/2016 1658   GLUCOSEU NEGATIVE 09/10/2016 1658   HGBUR NEGATIVE 09/10/2016 1658   BILIRUBINUR NEGATIVE 09/10/2016 1658   KETONESUR NEGATIVE 09/10/2016 1658   PROTEINUR  NEGATIVE 09/10/2016 1658   NITRITE NEGATIVE 09/10/2016 1658   LEUKOCYTESUR NEGATIVE 09/10/2016 1658     RADIOLOGY: Ct Head Code Stroke Wo Contrast  Result Date: 11/14/2016 CLINICAL DATA:  Code stroke. Focal neuro deficit less than 6 hours, stroke suspected. EXAM: CT HEAD WITHOUT CONTRAST TECHNIQUE: Contiguous axial images were obtained from the base of the skull through the vertex without  intravenous contrast. COMPARISON:  MRI head 09/11/2016 FINDINGS: Brain: Mild atrophy. Chronic microvascular ischemic change in the white matter. Negative for acute infarct. Negative for hemorrhage or mass. Vascular: Negative for hyperdense vessel Skull: Negative Sinuses/Orbits: Negative Other: None ASPECTS (Alberta Stroke Program Early CT Score) - Ganglionic level infarction (caudate, lentiform nuclei, internal capsule, insula, M1-M3 cortex): 7 - Supraganglionic infarction (M4-M6 cortex): 3 Total score (0-10 with 10 being normal): 10 IMPRESSION: 1. No acute abnormality. Atrophy with chronic microvascular ischemia. 2. ASPECTS is 10 These results were called by telephone at the time of interpretation on 11/14/2016 at 7:48 pm to Dr. Nita Sickle , who verbally acknowledged these results. Electronically Signed   By: Marlan Palau M.D.   On: 11/14/2016 19:49    EKG: Orders placed or performed during the hospital encounter of 11/14/16  . ED EKG  . ED EKG  . EKG 12-Lead  . EKG 12-Lead  . EKG 12-Lead  . EKG 12-Lead    IMPRESSION AND PLAN:  * Dizziness and a fall   May be TIA Vs stroke      Monitor on tele, MRI and MRA brain.   Carotid doppler studies and Echo.   Check HBA1c and Lipid.   Cont plavix.   Neuro consult.   Check orthostatic vitals and UA ( need to be sent).  * Bradycardia, hypotension   Monitor on tele.   Check Echo, cardio consult.   Hold Htn meds.   Not on any rate controlling meds.  * Hyperlipidemia   COnt statin.  All the records are reviewed and case discussed with ED provider. Management plans discussed with the patient, family and they are in agreement.  CODE STATUS: Full. Code Status History    Date Active Date Inactive Code Status Order ID Comments User Context   09/10/2016  4:17 PM 09/11/2016  6:50 PM Full Code [id number]  Auburn Bilberry, MD Inpatient   09/26/2015  8:50 PM 10/01/2015  6:57 PM Full Code [id number]  Clapacs, Jackquline Denmark, MD Inpatient       TOTAL  TIME TAKING CARE OF THIS PATIENT: 50 minutes.    Altamese Dilling M.D on 11/14/2016   Between 7am to 6pm - Pager - [phone]  After 6pm go to [email or web address] - password [email or web address] [facility]  [facility] [place] [facility]  Office  [phone]  CC: Primary care physician; Ofilia Neas, PA-C   Note: This dictation was prepared with Dragon dictation along with smaller phrase technology. Any transcriptional errors that result from this process are unintentional.

## 2016-11-14 NOTE — ED Notes (Signed)
Unsuccessful IV start x2.  

## 2016-11-15 ENCOUNTER — Observation Stay: Payer: Medicare Other

## 2016-11-15 ENCOUNTER — Observation Stay
Admit: 2016-11-15 | Discharge: 2016-11-15 | Disposition: A | Discharge status: Home / Self Care | Payer: Medicare Other | Attending: Internal Medicine | Admitting: Internal Medicine

## 2016-11-15 DIAGNOSIS — I639 Cerebral infarction, unspecified: Principal | ICD-10-CM

## 2016-11-15 LAB — LIPID PANEL
Cholesterol: 142 mg/dL (ref 0–200)
HDL: 31 mg/dL — ABNORMAL LOW (ref >40)
LDL CALC: 65 mg/dL (ref 0–99)
Total CHOL/HDL Ratio: 4.6 RATIO
Triglycerides: 232 mg/dL — ABNORMAL HIGH (ref <150)
VLDL: 46 mg/dL — AB (ref 0–40)

## 2016-11-15 LAB — HEMOGLOBIN A1C
HEMOGLOBIN A1C: 5.4 % (ref 4.8–5.6)
Mean Plasma Glucose: 108.28 mg/dL

## 2016-11-15 LAB — ECHOCARDIOGRAM COMPLETE
HEIGHTINCHES: 65 in
WEIGHTICAEL: 2240 [oz_av]

## 2016-11-15 MED ORDER — TRAZODONE HCL 50 MG PO TABS
100.0000 mg | ORAL_TABLET | Freq: Every day | ORAL | Status: DC
  Administered 2016-11-15 – 2016-11-16 (×2): 100 mg via ORAL
  Filled 2016-11-15 (×2): qty 2

## 2016-11-15 MED ORDER — CLONAZEPAM 0.5 MG PO TABS
0.5000 mg | ORAL_TABLET | Freq: Every day | ORAL | Status: DC
  Administered 2016-11-15 – 2016-11-16 (×2): 0.5 mg via ORAL
  Filled 2016-11-15 (×2): qty 1

## 2016-11-15 NOTE — Progress Notes (Signed)
SLP Cancellation Note  Patient Details Name: Maria Horton MRN: [id number] DOB: 01-Jan-1941   Cancelled treatment:       Reason Eval/Treat Not Completed: SLP screened, no needs identified, will sign off   Speech Therapy Note: received order, reviewed chart notes. Consulted NSG then pt.  Pt denied any new difficulty swallowing, but reported that she had trouble in the past w/ food feeling "stuck" and had a swallow study on 08/13/16. Per chart review, the swallow study revealed there was no laryngeal penetration, aspiration, or residue noted w/ all consistencies. Per chart review, pt [facility] and was diagnosed w/ "some persistent dysphagia", which could be a result of anterior cervical approach and related to surgery but pt also has some prominent osteophytes in the upper cervical spine (all per MD note). Pt also has medical hx including reflux, gastroparesis, vascular dementia, fibromyalgia, and anxiety/depression. Pt continues to eat a regular diet including cheeseburgers. ST educated pt on general aspiration precautions and general reflux precautions to aid in swallowing during the esophageal phase - also given education on ways to prepare foods/options of foods easier to swallow and clear the Esophageal phase. Per NSG, pt tolerates pills w/ water. Recommend pt f/u w/ GI for management of her esophageal dysmotility and reflux.  Pt conversed at conversational level w/out deficits noted; pt denied any speech-language deficits and was able to order her lunch meal on the phone w/ staff appropriately. Noted pt's baseline of vascular dementia per chart note. No further skilled ST services indicated at this time as pt appears at her baseline. Pt agreed. NSG to reconsult if any change in status while admitted.    Maria Horton, SLP-Graduate Student Maria Horton 11/15/2016, 12:55 PM   This information has been reviewed and agreed upon by this supervising clinician. This patient note,  response to treatment and overall treatment plan has been reviewed and this clinician agrees with the information provided.   11/15/16, 1:12 PM [phone]  Maria SomKatherine Alayia Meggison, MS, CCC-SLP

## 2016-11-15 NOTE — Progress Notes (Signed)
CRITICAL VALUE ALERT  Critical Value:  [facility] for Positive MRI Result  Date & Time Notied:  11/15/2016 0645  Provider Notified: Dr. Sheryle Hailiamond (Paged)  Orders Received/Actions taken: Awaiting for feedback

## 2016-11-15 NOTE — Progress Notes (Signed)
[employer] - [place] [facility]                                                                                                                                                                                  Patient Demographics   Maria Horton, is a 76 y.o. female, DOB - July 09, 1940, ZOX:[id number]  Admit date - 11/14/2016   Admitting Physician Altamese Dilling, MD  Outpatient Primary MD for the patient is No primary care provider on file.   LOS - 0  Subjective: Patient admitted with dizziness and fall initially was noted to have blood pressure in the 90s low heart rate.    Review of Systems:   CONSTITUTIONAL: No documented fever. No fatigue, weakness. No weight gain, no weight loss.  EYES: No blurry or double vision.  ENT: No tinnitus. No postnasal drip. No redness of the oropharynx.  RESPIRATORY: No cough, no wheeze, no hemoptysis. No dyspnea.  CARDIOVASCULAR: No chest pain. No orthopnea. No palpitations. No syncope.  GASTROINTESTINAL: No nausea, no vomiting or diarrhea. No abdominal pain. No melena or hematochezia.  GENITOURINARY: No dysuria or hematuria.  ENDOCRINE: No polyuria or nocturia. No heat or cold intolerance.  HEMATOLOGY: No anemia. No bruising. No bleeding.  INTEGUMENTARY: No rashes. No lesions.  MUSCULOSKELETAL: No arthritis. No swelling. No gout.  NEUROLOGIC: No numbness, tingling, or ataxia. No seizure-type activity.  PSYCHIATRIC: No anxiety. No insomnia. No ADD.    Vitals:   Vitals:   11/15/16 0410 11/15/16 0600 11/15/16 1005 11/15/16 1339  BP: (!) 107/49 (!) 110/49 (!) 116/58 (!) 141/63  Pulse:   (!) 45 (!) 45  Resp: 20 18 18 20   Temp:  98.6 F (37 C)  98.6 F (37 C)  TempSrc:  Oral  Oral  SpO2: 100% 98% 100% 99%  Weight:      Height:        Wt Readings from Last 3 Encounters:  11/14/16 140 lb (63.5 kg)  10/29/16 143 lb 6.4 oz (65 kg)  10/15/16 142 lb 6.4 oz (64.6 kg)     Intake/Output Summary (Last 24 hours) at  11/15/16 1607 Last data filed at 11/15/16 1425  Gross per 24 hour  Intake           925.83 ml  Output                0 ml  Net           925.83 ml    Physical Exam:   GENERAL: Pleasant-appearing in no apparent distress.  HEAD, EYES, EARS, NOSE AND THROAT: Atraumatic, normocephalic. Extraocular muscles are intact. Pupils equal and reactive to light. Sclerae anicteric. No conjunctival  injection. No oro-pharyngeal erythema.  NECK: Supple. There is no jugular venous distention. No bruits, no lymphadenopathy, no thyromegaly.  HEART: Regular rate and rhythm,. No murmurs, no rubs, no clicks.  LUNGS: Clear to auscultation bilaterally. No rales or rhonchi. No wheezes.  ABDOMEN: Soft, flat, nontender, nondistended. Has good bowel sounds. No hepatosplenomegaly appreciated.  EXTREMITIES: No evidence of any cyanosis, clubbing, or peripheral edema.  +2 pedal and radial pulses bilaterally.  NEUROLOGIC: The patient is alert, awake, and oriented x3 with no focal motor or sensory deficits appreciated bilaterally.  SKIN: Moist and warm with no rashes appreciated.  Psych: Not anxious, depressed LN: No inguinal LN enlargement    Antibiotics   Anti-infectives    None      Medications   Scheduled Meds: . citalopram  20 mg Oral Daily  . clopidogrel  75 mg Oral Daily  . gabapentin  100 mg Oral BID  . heparin  5,000 Units Subcutaneous Q8H  . pravastatin  10 mg Oral QPM   Continuous Infusions: . sodium chloride 50 mL/hr at 11/14/16 2329   PRN Meds:.acetaminophen **OR** acetaminophen (TYLENOL) oral liquid 160 mg/5 mL **OR** acetaminophen, albuterol, diphenhydrAMINE, fluticasone, senna-docusate   Data Review:   Micro Results No results found for this or any previous visit (from the past 240 hour(s)).  Radiology Reports Dg Chest 2 View  Result Date: 11/14/2016 CLINICAL DATA:  Acute onset of syncope.  Initial encounter. EXAM: CHEST  2 VIEW COMPARISON:  None. FINDINGS: The lungs are  well-aerated. There is elevation of the right hemidiaphragm. There is no evidence of focal opacification, pleural effusion or pneumothorax. The deep sulcus on the right side is thought to reflect a normal variant. The heart is normal in size; the mediastinal contour is within normal limits. No acute osseous abnormalities are seen. Cervical spinal fusion hardware is noted. IMPRESSION: Elevation of the right hemidiaphragm. No acute cardiopulmonary process seen. Electronically Signed   By: Roanna Raider M.D.   On: 11/14/2016 22:59   Mr Brain Wo Contrast  Result Date: 11/15/2016 CLINICAL DATA:  76 y/o  F; vertigo, episodic, peripheral. EXAM: MRI HEAD WITHOUT CONTRAST MRA HEAD WITHOUT CONTRAST TECHNIQUE: Multiplanar, multiecho pulse sequences of the brain and surrounding structures were obtained without intravenous contrast. Angiographic images of the head were obtained using MRA technique without contrast. COMPARISON:  09/11/2016 MRI and MRA of the head. 11/14/2016 CT of the head. FINDINGS: MRI HEAD FINDINGS Brain: For subcentimeter foci of reduced diffusion are present, 3 in the left frontal lobe and 1 in the right frontoparietal junction compatible with acute/ early subacute infarction (series 100, image 43 and 44). No associated hemorrhage or mass effect. 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 Stable background of mild chronic microvascular ischemic changes and parenchymal volume loss of the brain for age. No hydrocephalus, extra-axial collection, or effacement of basilar cisterns. Vascular: Normal flow voids. Skull and upper cervical spine: Normal marrow signal. Sinuses/Orbits: Partial opacification of mastoid air cells. No abnormal signal of paranasal sinuses. Bilateral intra-ocular lens replacement. Other: None. MRA HEAD FINDINGS Internal carotid arteries: Patent. Mild lumen irregularity without significant stenosis. Anterior cerebral arteries: Large left A1, no appreciable right A1, and large  anterior communicating artery, normal variant. Middle cerebral arteries: Patent. Mild M1 segment lumen irregularity without significant stenosis. Anterior communicating artery: Patent. Posterior communicating arteries: Not identified, likely hypoplastic or absent. Posterior cerebral arteries:  Patent. Basilar artery:  Patent. Vertebral arteries:  Patent. No evidence of high-grade stenosis, large vessel occlusion, or aneurysm. IMPRESSION:  MRI head: 1. Subcentimeter foci of cortical acute/early subacute infarction are present in left frontal lobe and right frontoparietal junction. Multiple vascular territories suggests embolic phenomenon. 2. Stable mild chronic microvascular ischemic changes and parenchymal volume loss of the brain. 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 MRA head: Patent circle of Willis. No large vessel occlusion, aneurysm, or significant stenosis is identified. These results will be called to the ordering clinician or representative by the Radiologist Assistant, and communication documented in the PACS or zVision Dashboard. Electronically Signed   By: Mitzi Hansen M.D.   On: 11/15/2016 06:23   US Carotid Bilateral (at [facility] And [facility] Only)  Result Date: 11/15/2016 CLINICAL DATA:  Cerebral infarction, syncope and hypertension. EXAM: BILATERAL CAROTID DUPLEX ULTRASOUND TECHNIQUE: Wallace Cullens scale imaging, color Doppler and duplex ultrasound were performed of bilateral carotid and vertebral arteries in the neck. COMPARISON:  None. FINDINGS: Criteria: Quantification of carotid stenosis is based on velocity parameters that correlate the residual internal carotid diameter with NASCET-based stenosis levels, using the diameter of the distal internal carotid lumen as the denominator for stenosis measurement. The following velocity measurements were obtained: RIGHT ICA:  51/12 cm/sec CCA:  63/9 cm/sec SYSTOLIC ICA/CCA RATIO:  0.8 DIASTOLIC ICA/CCA RATIO:  1.3 ECA:  79 cm/sec LEFT  ICA:  59/16 cm/sec CCA:  66/13 cm/sec SYSTOLIC ICA/CCA RATIO:  0.9 DIASTOLIC ICA/CCA RATIO:  1.3 ECA:  66 cm/sec RIGHT CAROTID ARTERY: No significant plaque identified. Velocities and waveforms are normal. No evidence of right carotid stenosis in the neck. RIGHT VERTEBRAL ARTERY: Antegrade flow with normal waveform and velocity. LEFT CAROTID ARTERY: Mild amount of plaque at the level of the carotid bulb and proximal ICA. No significant stenosis identified with estimated less than 50% left ICA stenosis. LEFT VERTEBRAL ARTERY: Antegrade flow with normal waveform and velocity. Incidental small left thyroid nodules of 1 cm or less in diameter. These have a spongiform appearance by ultrasound and are likely benign. The entire thyroid gland was not imaged. IMPRESSION: 1. Mild amount of plaque at the level of the left carotid bulb and proximal ICA. Estimated left ICA stenosis is less than 50%. 2. No evidence of right carotid stenosis. 3. Incidental small left thyroid nodules which appear spongiform and benign. Electronically Signed   By: Irish Lack M.D.   On: 11/15/2016 10:00   Mr Maxine Glenn Head/brain YQ Cm  Result Date: 11/15/2016 CLINICAL DATA:  76 y/o  F; vertigo, episodic, peripheral. EXAM: MRI HEAD WITHOUT CONTRAST MRA HEAD WITHOUT CONTRAST TECHNIQUE: Multiplanar, multiecho pulse sequences of the brain and surrounding structures were obtained without intravenous contrast. Angiographic images of the head were obtained using MRA technique without contrast. COMPARISON:  09/11/2016 MRI and MRA of the head. 11/14/2016 CT of the head. FINDINGS: MRI HEAD FINDINGS Brain: For subcentimeter foci of reduced diffusion are present, 3 in the left frontal lobe and 1 in the right frontoparietal junction compatible with acute/ early subacute infarction (series 100, image 43 and 44). No associated hemorrhage or mass effect. 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 Stable background of mild chronic microvascular ischemic  changes and parenchymal volume loss of the brain for age. No hydrocephalus, extra-axial collection, or effacement of basilar cisterns. Vascular: Normal flow voids. Skull and upper cervical spine: Normal marrow signal. Sinuses/Orbits: Partial opacification of mastoid air cells. No abnormal signal of paranasal sinuses. Bilateral intra-ocular lens replacement. Other: None. MRA HEAD FINDINGS Internal carotid arteries: Patent. Mild lumen irregularity without significant stenosis. Anterior cerebral arteries: Large left A1, no appreciable  right A1, and large anterior communicating artery, normal variant. Middle cerebral arteries: Patent. Mild M1 segment lumen irregularity without significant stenosis. Anterior communicating artery: Patent. Posterior communicating arteries: Not identified, likely hypoplastic or absent. Posterior cerebral arteries:  Patent. Basilar artery:  Patent. Vertebral arteries:  Patent. No evidence of high-grade stenosis, large vessel occlusion, or aneurysm. IMPRESSION: MRI head: 1. Subcentimeter foci of cortical acute/early subacute infarction are present in left frontal lobe and right frontoparietal junction. Multiple vascular territories suggests embolic phenomenon. 2. Stable mild chronic microvascular ischemic changes and parenchymal volume loss of the brain. 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 MRA head: Patent circle of Willis. No large vessel occlusion, aneurysm, or significant stenosis is identified. These results will be called to the ordering clinician or representative by the Radiologist Assistant, and communication documented in the PACS or zVision Dashboard. Electronically Signed   By: Mitzi Hansen M.D.   On: 11/15/2016 06:23   Ct Head Code Stroke Wo Contrast  Result Date: 11/14/2016 CLINICAL DATA:  Code stroke. Focal neuro deficit less than 6 hours, stroke suspected. EXAM: CT HEAD WITHOUT CONTRAST TECHNIQUE: Contiguous axial images were obtained  from the base of the skull through the vertex without intravenous contrast. COMPARISON:  MRI head 09/11/2016 FINDINGS: Brain: Mild atrophy. Chronic microvascular ischemic change in the white matter. Negative for acute infarct. Negative for hemorrhage or mass. Vascular: Negative for hyperdense vessel Skull: Negative Sinuses/Orbits: Negative Other: None ASPECTS (Alberta Stroke Program Early CT Score) - Ganglionic level infarction (caudate, lentiform nuclei, internal capsule, insula, M1-M3 cortex): 7 - Supraganglionic infarction (M4-M6 cortex): 3 Total score (0-10 with 10 being normal): 10 IMPRESSION: 1. No acute abnormality. Atrophy with chronic microvascular ischemia. 2. ASPECTS is 10 These results were called by telephone at the time of interpretation on 11/14/2016 at 7:48 pm to Dr. Nita Sickle , who verbally acknowledged these results. Electronically Signed   By: Marlan Palau M.D.   On: 11/14/2016 19:49     CBC  Recent Labs Lab 11/14/16 1916  WBC 5.4  HGB 13.9  HCT 41.2  PLT 171  MCV 92.6  MCH 31.2  MCHC 33.7  RDW 14.5  LYMPHSABS 1.8  MONOABS 0.6  EOSABS 0.0  BASOSABS 0.0    Chemistries   Recent Labs Lab 11/14/16 1916  NA 138  K 3.5  CL 105  CO2 26  GLUCOSE 121*  BUN 21*  CREATININE 1.31*  CALCIUM 9.1  MG 2.0  AST 24  ALT 9*  ALKPHOS 42  BILITOT 1.0   ------------------------------------------------------------------------------------------------------------------ estimated creatinine clearance is 33.9 mL/min (A) (by C-G formula based on SCr of 1.31 mg/dL (H)). ------------------------------------------------------------------------------------------------------------------  Recent Labs  11/15/16 0652  HGBA1C 5.4   ------------------------------------------------------------------------------------------------------------------  Recent Labs  11/15/16 0652  CHOL 142  HDL 31*  LDLCALC 65  TRIG 409*  CHOLHDL 4.6    ------------------------------------------------------------------------------------------------------------------ No results for input(s): TSH, T4TOTAL, T3FREE, THYROIDAB in the last 72 hours.  Invalid input(s): FREET3 ------------------------------------------------------------------------------------------------------------------ No results for input(s): VITAMINB12, FOLATE, FERRITIN, TIBC, IRON, RETICCTPCT in the last 72 hours.  Coagulation profile  Recent Labs Lab 11/14/16 1916  INR 1.01    No results for input(s): DDIMER in the last 72 hours.  Cardiac Enzymes  Recent Labs Lab 11/14/16 1916  TROPONINI <0.03   ------------------------------------------------------------------------------------------------------------------ Invalid input(s): POCBNP    Assessment & Plan   Patient is a 76 year old admitted with dizziness and near syncope and a fall   * Dizziness and a fall  suspect this is related to her  bradycardia and hypotension Continue IV fluids Neurology was consult for possible TIA however I do not feel this is TIA MRI of the brain is negative      * Bradycardia, hypotension echo is pending Cardiology consult pending patient is not on any beta blockers, or heart rate lowering drugs   * Hyperlipidemia Continue Pravachol  *previous history of TIAs continue Plavix      Code Status Orders        Start     Ordered   11/14/16 2146  Full code  Continuous     11/14/16 2145    Code Status History    Date Active Date Inactive Code Status Order ID Comments User Context   09/10/2016  4:17 PM 09/11/2016  6:50 PM Full Code [id number]  Auburn BilberryPatel, Panagiotis Oelkers, MD Inpatient   09/26/2015  8:50 PM 10/01/2015  6:57 PM Full Code [id number]  Clapacs, Jackquline DenmarkJohn T, MD Inpatient           Consults  Cardiology and neurology   DVT Prophylaxis  Lovenox   Lab Results  Component Value Date   PLT 171 11/14/2016     Time Spent in minutes  35 minutes Greater than 50% of  time spent in care coordination and counseling patient regarding the condition and plan of care.   Auburn BilberryPATEL, Amadi Yoshino M.D on 11/15/2016 at 4:07 PM  Between 7am to 6pm - Pager - [phone]  After 6pm go to [email or web address] - password [email or web address] [facility]  [facility]   Office  [phone]

## 2016-11-15 NOTE — Care Management Obs Status (Signed)
MEDICARE OBSERVATION STATUS NOTIFICATION   Patient Details  Name: Maria CoronaMary L Formica MRN: [id number] Date of Birth: 10/30/1940   Medicare Observation Status Notification Given:  Yes    Gwenette GreetBrenda S Coy [place]ochford, RN 11/15/2016, 10:26 AM

## 2016-11-15 NOTE — Consult Note (Signed)
Referring Physician: Allena Katz    Chief Complaint: gait ataxia  HPI: Maria Horton is an 76 y.o. female with a recent history of stroke who was at home watching television. Attempted to get up and was unable to keep her balance with walking.  Attempted again and and continued to have difficulty.  When in the ED was unable to verbally express herself as well.  Patient on Plavix at home.  Patient repots she is now back to baseline other than feeling a little lightheaded.  Was bradycardic on presentation.   Initial NIHSS of 0.  Date last known well: Date: 11/15/2016 Time last known well: Time: 18:30 tPA Given: No: Recent infarct, improvement of symptoms  Past Medical History:  Diagnosis Date  . Anxiety   . Chronic back pain   . Constipation   . Depression   . Diverticulosis   . Fibromyalgia   . Gastroparesis   . Hypertension   . Hypoglycemia   . Hypokalemia   . Osteoarthritis   . Reflux   . Stroke (HCC)   . TIA (transient ischemic attack)   . Vascular dementia   . Vitamin D deficiency     Past Surgical History:  Procedure Laterality Date  . ABDOMINAL HYSTERECTOMY    . CHOLECYSTECTOMY    . COLON SURGERY    . NECK SURGERY    . ROTATOR CUFF REPAIR    . TONSILLECTOMY      Family History  Problem Relation Age of Onset  . Hypertension Mother    Social History:  reports that she has never smoked. She has never used smokeless tobacco. She reports that she does not drink alcohol or use drugs.  Allergies:  Allergies  Allergen Reactions  . Ace Inhibitors Other (See Comments)    achy  . Beta Adrenergic Blockers Other (See Comments)    Makes achy/feel sick  . Calcium Channel Blockers Other (See Comments)    Feels achy like sick  . Tape Rash    Medications:  I have reviewed the patient's current medications. Prior to Admission:  Prescriptions Prior to Admission  Medication Sig Dispense Refill Last Dose  . citalopram (CELEXA) 40 MG tablet Take 0.5 tablets (20 mg total) by  mouth daily.   11/14/2016 at Unknown time  . clonazePAM (KLONOPIN) 0.5 MG tablet Take 1.5 tablets (0.75 mg total) by mouth at bedtime. 45 tablet 0 11/13/2016 at Unknown time  . clopidogrel (PLAVIX) 75 MG tablet Take 1 tablet (75 mg total) by mouth daily. 30 tablet 11 11/14/2016 at Unknown time  . gabapentin (NEURONTIN) 100 MG capsule Take 100 mg by mouth 2 (two) times daily.   11/14/2016 at Unknown time  . pravastatin (PRAVACHOL) 10 MG tablet Take 10 mg by mouth daily.   11/14/2016 at Unknown time  . traZODone (DESYREL) 150 MG tablet Take 1.5 tablets (225 mg total) by mouth at bedtime.   11/13/2016 at Unknown time  . albuterol (PROVENTIL HFA;VENTOLIN HFA) 108 (90 Base) MCG/ACT inhaler Inhale 2 puffs into the lungs every 6 (six) hours as needed for wheezing or shortness of breath.     . fluticasone (FLONASE) 50 MCG/ACT nasal spray Place 2 sprays into both nostrils daily as needed for allergies or rhinitis.     Marland Kitchen HYDROcodone-acetaminophen (NORCO/VICODIN) 5-325 MG tablet Take 1-2 tablets by mouth every 8 (eight) hours as needed for moderate pain.    Taking  . ondansetron (ZOFRAN) 4 MG tablet Take 4 mg by mouth every 8 (eight) hours as needed  for nausea or vomiting.   Taking  . triamterene-hydrochlorothiazide (DYAZIDE) 37.5-25 MG capsule Take 1 capsule by mouth daily.   Taking   Scheduled: . citalopram  20 mg Oral Daily  . clopidogrel  75 mg Oral Daily  . gabapentin  100 mg Oral BID  . heparin  5,000 Units Subcutaneous Q8H  . pravastatin  10 mg Oral QPM    ROS: History obtained from the patient  General ROS: negative for - chills, fatigue, fever, night sweats, weight gain or weight loss Psychological ROS: negative for - behavioral disorder, hallucinations, memory difficulties, mood swings or suicidal ideation Ophthalmic ROS: negative for - blurry vision, double vision, eye pain or loss of vision ENT ROS: negative for - epistaxis, nasal discharge, oral lesions, sore throat, tinnitus or  vertigo Allergy and Immunology ROS: negative for - hives or itchy/watery eyes Hematological and Lymphatic ROS: negative for - bleeding problems, bruising or swollen lymph nodes Endocrine ROS: negative for - galactorrhea, hair pattern changes, polydipsia/polyuria or temperature intolerance Respiratory ROS: negative for - cough, hemoptysis, shortness of breath or wheezing Cardiovascular ROS: negative for - chest pain, dyspnea on exertion, edema or irregular heartbeat Gastrointestinal ROS: negative for - abdominal pain, diarrhea, hematemesis, nausea/vomiting or stool incontinence Genito-Urinary ROS: negative for - dysuria, hematuria, incontinence or urinary frequency/urgency Musculoskeletal ROS: negative for - joint swelling or muscular weakness Neurological ROS: as noted in HPI Dermatological ROS: negative for rash and skin lesion changes  Physical Examination: Blood pressure (!) 116/58, pulse (!) 45, temperature 98.6 F (37 C), temperature source Oral, resp. rate 18, height 5\' 5"  (1.651 m), weight 63.5 kg (140 lb), SpO2 100 %.  HEENT-  Normocephalic, no lesions, without obvious abnormality.  Normal external eye and conjunctiva.  Normal TM's bilaterally.  Normal auditory canals and external ears. Normal external nose, mucus membranes and septum.  Normal pharynx. Cardiovascular- S1, S2 normal, pulses palpable throughout   Lungs- chest clear, no wheezing, rales, normal symmetric air entry Abdomen- soft, non-tender; bowel sounds normal; no masses,  no organomegaly Extremities- no edema Lymph-no adenopathy palpable Musculoskeletal-no joint tenderness, deformity or swelling Skin-warm and dry, no hyperpigmentation, vitiligo, or suspicious lesions  Neurological Examination   Mental Status: Alert, oriented, thought content appropriate.  Speech fluent without evidence of aphasia.  Able to follow 3 step commands without difficulty. Cranial Nerves: II: Discs flat bilaterally; Visual fields grossly  normal, pupils equal, round, reactive to light and accommodation III,IV, VI: ptosis not present, extra-ocular motions intact bilaterally V,VII: smile symmetric, facial light touch sensation normal bilaterally VIII: hearing normal bilaterally IX,X: gag reflex present XI: bilateral shoulder shrug XII: midline tongue extension Motor: Right : Upper extremity   5/5. 5-/5 right hand grip    Left:     Upper extremity   5/5  Lower extremity   5/5        Lower extremity   5/5 Tone and bulk:normal tone throughout; no atrophy noted Sensory: Pinprick and light touch intact throughout, bilaterally Deep Tendon Reflexes: 2+ and symmetric with absent AJ's bilaterally Plantars: Right: mute   Left: mute Cerebellar: Normal finger-to-nose and normal heel-to-shin testing b ilaterally Gait: not tested due to safety concerns    Laboratory Studies:  Basic Metabolic Panel:  Recent Labs Lab 11/14/16 1916  NA 138  K 3.5  CL 105  CO2 26  GLUCOSE 121*  BUN 21*  CREATININE 1.31*  CALCIUM 9.1  MG 2.0    Liver Function Tests:  Recent Labs Lab 11/14/16 1916  AST 24  ALT 9*  ALKPHOS 42  BILITOT 1.0  PROT 6.5  ALBUMIN 3.7   No results for input(s): LIPASE, AMYLASE in the last 168 hours. No results for input(s): AMMONIA in the last 168 hours.  CBC:  Recent Labs Lab 11/14/16 1916  WBC 5.4  NEUTROABS 2.9  HGB 13.9  HCT 41.2  MCV 92.6  PLT 171    Cardiac Enzymes:  Recent Labs Lab 11/14/16 1916  TROPONINI <0.03    BNP: Invalid input(s): POCBNP  CBG: No results for input(s): GLUCAP in the last 168 hours.  Microbiology: No results found for this or any previous visit.  Coagulation Studies:  Recent Labs  11/14/16 1916  LABPROT 13.2  INR 1.01    Urinalysis:  Recent Labs Lab 11/14/16 2300  COLORURINE STRAW*  LABSPEC 1.009  PHURINE 6.0  GLUCOSEU NEGATIVE  HGBUR NEGATIVE  BILIRUBINUR NEGATIVE  KETONESUR NEGATIVE  PROTEINUR NEGATIVE  NITRITE NEGATIVE   LEUKOCYTESUR NEGATIVE    Lipid Panel:    Component Value Date/Time   CHOL 142 11/15/2016 0652   TRIG 232 (H) 11/15/2016 0652   HDL 31 (L) 11/15/2016 0652   CHOLHDL 4.6 11/15/2016 0652   VLDL 46 (H) 11/15/2016 0652   LDLCALC 65 11/15/2016 0652    HgbA1C:  Lab Results  Component Value Date   HGBA1C 5.4 11/15/2016    Urine Drug Screen:     Component Value Date/Time   LABOPIA NONE DETECTED 11/14/2016 2300   COCAINSCRNUR NONE DETECTED 11/14/2016 2300   LABBENZ NONE DETECTED 11/14/2016 2300   AMPHETMU NONE DETECTED 11/14/2016 2300   THCU NONE DETECTED 11/14/2016 2300   LABBARB NONE DETECTED 11/14/2016 2300    Alcohol Level:  Recent Labs Lab 11/14/16 1916  ETH <10    Other results: EKG: sinus bradycardia at 42 bpm.  Imaging: Dg Chest 2 View  Result Date: 11/14/2016 CLINICAL DATA:  Acute onset of syncope.  Initial encounter. EXAM: CHEST  2 VIEW COMPARISON:  None. FINDINGS: The lungs are well-aerated. There is elevation of the right hemidiaphragm. There is no evidence of focal opacification, pleural effusion or pneumothorax. The deep sulcus on the right side is thought to reflect a normal variant. The heart is normal in size; the mediastinal contour is within normal limits. No acute osseous abnormalities are seen. Cervical spinal fusion hardware is noted. IMPRESSION: Elevation of the right hemidiaphragm. No acute cardiopulmonary process seen. Electronically Signed   By: Roanna Raider M.D.   On: 11/14/2016 22:59   Mr Brain Wo Contrast  Result Date: 11/15/2016 CLINICAL DATA:  75 y/o  F; vertigo, episodic, peripheral. EXAM: MRI HEAD WITHOUT CONTRAST MRA HEAD WITHOUT CONTRAST TECHNIQUE: Multiplanar, multiecho pulse sequences of the brain and surrounding structures were obtained without intravenous contrast. Angiographic images of the head were obtained using MRA technique without contrast. COMPARISON:  09/11/2016 MRI and MRA of the head. 11/14/2016 CT of the head. FINDINGS: MRI  HEAD FINDINGS Brain: For subcentimeter foci of reduced diffusion are present, 3 in the left frontal lobe and 1 in the right frontoparietal junction compatible with acute/ early subacute infarction (series 100, image 43 and 44). No associated hemorrhage or mass effect. 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 Stable background of mild chronic microvascular ischemic changes and parenchymal volume loss of the brain for age. No hydrocephalus, extra-axial collection, or effacement of basilar cisterns. Vascular: Normal flow voids. Skull and upper cervical spine: Normal marrow signal. Sinuses/Orbits: Partial opacification of mastoid air cells. No abnormal signal of paranasal sinuses. Bilateral intra-ocular  lens replacement. Other: None. MRA HEAD FINDINGS Internal carotid arteries: Patent. Mild lumen irregularity without significant stenosis. Anterior cerebral arteries: Large left A1, no appreciable right A1, and large anterior communicating artery, normal variant. Middle cerebral arteries: Patent. Mild M1 segment lumen irregularity without significant stenosis. Anterior communicating artery: Patent. Posterior communicating arteries: Not identified, likely hypoplastic or absent. Posterior cerebral arteries:  Patent. Basilar artery:  Patent. Vertebral arteries:  Patent. No evidence of high-grade stenosis, large vessel occlusion, or aneurysm. IMPRESSION: MRI head: 1. Subcentimeter foci of cortical acute/early subacute infarction are present in left frontal lobe and right frontoparietal junction. Multiple vascular territories suggests embolic phenomenon. 2. Stable mild chronic microvascular ischemic changes and parenchymal volume loss of the brain. 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 MRA head: Patent circle of Willis. No large vessel occlusion, aneurysm, or significant stenosis is identified. These results will be called to the ordering clinician or representative by the Radiologist Assistant,  and communication documented in the PACS or zVision Dashboard. Electronically Signed   By: Mitzi HansenLance  Furusawa-Stratton M.D.   On: 11/15/2016 06:23   [place] Carotid Bilateral (at [facility] And Ap Only)  Result Date: 11/15/2016 CLINICAL DATA:  Cerebral infarction, syncope and hypertension. EXAM: BILATERAL CAROTID DUPLEX ULTRASOUND TECHNIQUE: Wallace CullensGray scale imaging, color Doppler and duplex ultrasound were performed of bilateral carotid and vertebral arteries in the neck. COMPARISON:  None. FINDINGS: Criteria: Quantification of carotid stenosis is based on velocity parameters that correlate the residual internal carotid diameter with NASCET-based stenosis levels, using the diameter of the distal internal carotid lumen as the denominator for stenosis measurement. The following velocity measurements were obtained: RIGHT ICA:  51/12 cm/sec CCA:  63/9 cm/sec SYSTOLIC ICA/CCA RATIO:  0.8 DIASTOLIC ICA/CCA RATIO:  1.3 ECA:  79 cm/sec LEFT ICA:  59/16 cm/sec CCA:  66/13 cm/sec SYSTOLIC ICA/CCA RATIO:  0.9 DIASTOLIC ICA/CCA RATIO:  1.3 ECA:  66 cm/sec RIGHT CAROTID ARTERY: No significant plaque identified. Velocities and waveforms are normal. No evidence of right carotid stenosis in the neck. RIGHT VERTEBRAL ARTERY: Antegrade flow with normal waveform and velocity. LEFT CAROTID ARTERY: Mild amount of plaque at the level of the carotid bulb and proximal ICA. No significant stenosis identified with estimated less than 50% left ICA stenosis. LEFT VERTEBRAL ARTERY: Antegrade flow with normal waveform and velocity. Incidental small left thyroid nodules of 1 cm or less in diameter. These have a spongiform appearance by ultrasound and are likely benign. The entire thyroid gland was not imaged. IMPRESSION: 1. Mild amount of plaque at the level of the left carotid bulb and proximal ICA. Estimated left ICA stenosis is less than 50%. 2. No evidence of right carotid stenosis. 3. Incidental small left thyroid nodules which appear spongiform and  benign. Electronically Signed   By: Irish LackGlenn  Yamagata M.D.   On: 11/15/2016 10:00   Mr Maxine GlennMra Head/brain ZOWo Cm  Result Date: 11/15/2016 CLINICAL DATA:  76 y/o  F; vertigo, episodic, peripheral. EXAM: MRI HEAD WITHOUT CONTRAST MRA HEAD WITHOUT CONTRAST TECHNIQUE: Multiplanar, multiecho pulse sequences of the brain and surrounding structures were obtained without intravenous contrast. Angiographic images of the head were obtained using MRA technique without contrast. COMPARISON:  09/11/2016 MRI and MRA of the head. 11/14/2016 CT of the head. FINDINGS: MRI HEAD FINDINGS Brain: For subcentimeter foci of reduced diffusion are present, 3 in the left frontal lobe and 1 in the right frontoparietal junction compatible with acute/ early subacute infarction (series 100, image 43 and 44). No associated hemorrhage or mass effect. Small chronic  infarct in left temporal operculum. Stable background of mild chronic microvascular ischemic changes and parenchymal volume loss of the brain for age. No hydrocephalus, extra-axial collection, or effacement of basilar cisterns. Vascular: Normal flow voids. Skull and upper cervical spine: Normal marrow signal. Sinuses/Orbits: Partial opacification of mastoid air cells. No abnormal signal of paranasal sinuses. Bilateral intra-ocular lens replacement. Other: None. MRA HEAD FINDINGS Internal carotid arteries: Patent. Mild lumen irregularity without significant stenosis. Anterior cerebral arteries: Large left A1, no appreciable right A1, and large anterior communicating artery, normal variant. Middle cerebral arteries: Patent. Mild M1 segment lumen irregularity without significant stenosis. Anterior communicating artery: Patent. Posterior communicating arteries: Not identified, likely hypoplastic or absent. Posterior cerebral arteries:  Patent. Basilar artery:  Patent. Vertebral arteries:  Patent. No evidence of high-grade stenosis, large vessel occlusion, or aneurysm. IMPRESSION: MRI head: 1.  Subcentimeter foci of cortical acute/early subacute infarction are present in left frontal lobe and right frontoparietal junction. Multiple vascular territories suggests embolic phenomenon. 2. Stable mild chronic microvascular ischemic changes and parenchymal volume loss of the brain. 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 MRA head: Patent circle of Willis. No large vessel occlusion, aneurysm, or significant stenosis is identified. These results will be called to the ordering clinician or representative by the Radiologist Assistant, and communication documented in the PACS or zVision Dashboard. Electronically Signed   By: Mitzi Hansen M.D.   On: 11/15/2016 06:23   Ct Head Code Stroke Wo Contrast  Result Date: 11/14/2016 CLINICAL DATA:  Code stroke. Focal neuro deficit less than 6 hours, stroke suspected. EXAM: CT HEAD WITHOUT CONTRAST TECHNIQUE: Contiguous axial images were obtained from the base of the skull through the vertex without intravenous contrast. COMPARISON:  MRI head 09/11/2016 FINDINGS: Brain: Mild atrophy. Chronic microvascular ischemic change in the white matter. Negative for acute infarct. Negative for hemorrhage or mass. Vascular: Negative for hyperdense vessel Skull: Negative Sinuses/Orbits: Negative Other: None ASPECTS ([place] Stroke Program Early CT Score) - Ganglionic level infarction (caudate, lentiform nuclei, internal capsule, insula, M1-M3 cortex): 7 - Supraganglionic infarction (M4-M6 cortex): 3 Total score (0-10 with 10 being normal): 10 IMPRESSION: 1. No acute abnormality. Atrophy with chronic microvascular ischemia. 2. ASPECTS is 10 These results were called by telephone at the time of interpretation on 11/14/2016 at 7:48 pm to Dr. Nita Sickle , who verbally acknowledged these results. Electronically Signed   By: Marlan Palau M.D.   On: 11/14/2016 19:49    Assessment: 76 y.o. female with a history of stroke presenting after an episode of  difficulty with gait and speech disturbance.  Patient last seen in August with acute infarct and started on ASA and Plavix.  ASA was discontinued on an outpatient basis.  MRI of the brain reviewed and shows multiple bilateral hemispheric, small, acute infarcts.  Suspect embolic etiology.  Carotid dopplers show no evidence of hemodynamically significant stenosis.  Echocardiogram pending.  A1c 5.4, LDL 65.  Patient on a statin.    Stroke Risk Factors - hypertension  Plan: 1. PT consult, OT consult, Speech consult 2. Echocardiogram pending 3. Prophylactic therapy-ASA 81mg  and Plavix 75mg  daily 4. Telemetry monitoring. If telemetry unrevealing as an inpatient, would recommend outpatient holter/LINQ monitoring.   5. Frequent neuro checks    Thana Farr, MD Neurology [phone] 11/15/2016, 12:54 PM

## 2016-11-15 NOTE — Consult Note (Signed)
[facility]  CARDIOLOGY CONSULT NOTE  Patient ID: Maria Horton MRN: [id number] DOB/AGE: [birth year] 76 y.o.  Admit date: 11/14/2016 Referring Physician Desiree HaneVachani Primary Physician Ofilia Neaslark, Michael L, PA-C  Primary Cardiologist none per patient Reason for Consultation Bradycardia, hypotension, syncope  HPI: 76 year old female referred for evaluation of bradycardia, hypotension, and syncope.  The patient has a history of hypertension, stroke in 08/2016, currently on Plavix, vascular dementia, and hypertension.  The patient was [facility] ER yesterday for acute onset of imbalance, with subsequent fall, with acute aphasia.  The patient's heart rate and blood pressure was noted to be low, with heart rate in the upper 30s-40s.  Admission labs notable for negative troponin, creatinine 1.31, BUN 20. ECG revealed sinus bradycardia at a rate of 42 bpm with prolonged QTC without acute ST or T wave abnormalities.  Chest x-ray negative for acute cardiopulmonary process.  Brain MRI revealed subcentimeter foci of cortical acute/early subacute infarction present in left frontal lobe and right frontoparietal junction. Multiple vascular territories suggests embolic phenomenon.  Carotid ultrasound revealed insignificant disease.  Currently, the patient denies chest pain, shortness of breath, or palpitations.  The patient states that she occasionally experiences shortness of breath, sometimes occurring when lying down at night, that resolves with taking deep breaths, but does not require that she sit upright.  The patient reports that she has felt more fatigue and lack of energy recently.  She denies significant peripheral edema.   Review of systems complete and found to be negative unless listed above     Past Medical History:  Diagnosis Date  . Anxiety   . Chronic back pain   . Constipation   . Depression   . Diverticulosis   . Fibromyalgia   . Gastroparesis   . Hypertension   . Hypoglycemia   .  Hypokalemia   . Osteoarthritis   . Reflux   . Stroke (HCC)   . TIA (transient ischemic attack)   . Vascular dementia   . Vitamin D deficiency     Past Surgical History:  Procedure Laterality Date  . ABDOMINAL HYSTERECTOMY    . CHOLECYSTECTOMY    . COLON SURGERY    . NECK SURGERY    . ROTATOR CUFF REPAIR    . TONSILLECTOMY      Prescriptions Prior to Admission  Medication Sig Dispense Refill Last Dose  . citalopram (CELEXA) 40 MG tablet Take 0.5 tablets (20 mg total) by mouth daily.   11/14/2016 at Unknown time  . clonazePAM (KLONOPIN) 0.5 MG tablet Take 1.5 tablets (0.75 mg total) by mouth at bedtime. 45 tablet 0 11/13/2016 at Unknown time  . clopidogrel (PLAVIX) 75 MG tablet Take 1 tablet (75 mg total) by mouth daily. 30 tablet 11 11/14/2016 at Unknown time  . gabapentin (NEURONTIN) 100 MG capsule Take 100 mg by mouth 2 (two) times daily.   11/14/2016 at Unknown time  . pravastatin (PRAVACHOL) 10 MG tablet Take 10 mg by mouth daily.   11/14/2016 at Unknown time  . traZODone (DESYREL) 150 MG tablet Take 1.5 tablets (225 mg total) by mouth at bedtime.   11/13/2016 at Unknown time  . albuterol (PROVENTIL HFA;VENTOLIN HFA) 108 (90 Base) MCG/ACT inhaler Inhale 2 puffs into the lungs every 6 (six) hours as needed for wheezing or shortness of breath.     . fluticasone (FLONASE) 50 MCG/ACT nasal spray Place 2 sprays into both nostrils daily as needed for allergies or rhinitis.     Marland Kitchen. HYDROcodone-acetaminophen (NORCO/VICODIN) 5-325  MG tablet Take 1-2 tablets by mouth every 8 (eight) hours as needed for moderate pain.    Taking  . ondansetron (ZOFRAN) 4 MG tablet Take 4 mg by mouth every 8 (eight) hours as needed for nausea or vomiting.   Taking  . triamterene-hydrochlorothiazide (DYAZIDE) 37.5-25 MG capsule Take 1 capsule by mouth daily.   Taking   Social History   Social History  . Marital status: Widowed    Spouse name: N/A  . Number of children: N/A  . Years of education: N/A    Occupational History  . Not on file.   Social History Main Topics  . Smoking status: Never Smoker  . Smokeless tobacco: Never Used  . Alcohol use No  . Drug use: No  . Sexual activity: Not on file   Other Topics Concern  . Not on file   Social History Narrative  . No narrative on file    Family History  Problem Relation Age of Onset  . Hypertension Mother       Review of systems complete and found to be negative unless listed above      PHYSICAL EXAM  General: Well developed, well nourished, in no acute distress HEENT:  Normocephalic and atramatic Neck:  No JVD.  Lungs: Normal effort of breathing, no wheezing Heart: Bradycardic, regular rhythm. Normal S1 and S2 without gallops or murmurs.  Abdomen: Bowel sounds are positive Msk: Patient lying in bed.  Gait not assessed. Extremities: No clubbing, cyanosis or edema.   Neuro: Alert and oriented X 3.  No focal neurological deficits. Psych:  Good affect, responds appropriately  Labs:   Lab Results  Component Value Date   WBC 5.4 11/14/2016   HGB 13.9 11/14/2016   HCT 41.2 11/14/2016   MCV 92.6 11/14/2016   PLT 171 11/14/2016    Recent Labs Lab 11/14/16 1916  NA 138  K 3.5  CL 105  CO2 26  BUN 21*  CREATININE 1.31*  CALCIUM 9.1  PROT 6.5  BILITOT 1.0  ALKPHOS 42  ALT 9*  AST 24  GLUCOSE 121*   Lab Results  Component Value Date   TROPONINI <0.03 11/14/2016    Lab Results  Component Value Date   CHOL 142 11/15/2016   CHOL 179 09/11/2016   CHOL 275 (H) 09/27/2015   Lab Results  Component Value Date   HDL 31 (L) 11/15/2016   HDL 34 (L) 09/11/2016   HDL 48 09/27/2015   Lab Results  Component Value Date   LDLCALC 65 11/15/2016   LDLCALC 70 09/11/2016   LDLCALC 176 (H) 09/27/2015   Lab Results  Component Value Date   TRIG 232 (H) 11/15/2016   TRIG 373 (H) 09/11/2016   TRIG 255 (H) 09/27/2015   Lab Results  Component Value Date   CHOLHDL 4.6 11/15/2016   CHOLHDL 5.3 09/11/2016    CHOLHDL 5.7 09/27/2015   No results found for: LDLDIRECT    Radiology: Dg Chest 2 View  Result Date: 11/14/2016 CLINICAL DATA:  Acute onset of syncope.  Initial encounter. EXAM: CHEST  2 VIEW COMPARISON:  None. FINDINGS: The lungs are well-aerated. There is elevation of the right hemidiaphragm. There is no evidence of focal opacification, pleural effusion or pneumothorax. The deep sulcus on the right side is thought to reflect a normal variant. The heart is normal in size; the mediastinal contour is within normal limits. No acute osseous abnormalities are seen. Cervical spinal fusion hardware is noted. IMPRESSION: Elevation of the right  hemidiaphragm. No acute cardiopulmonary process seen. Electronically Signed   By: Roanna Raider M.D.   On: 11/14/2016 22:59   Mr Brain Wo Contrast  Result Date: 11/15/2016 CLINICAL DATA:  76 y/o  F; vertigo, episodic, peripheral. EXAM: MRI HEAD WITHOUT CONTRAST MRA HEAD WITHOUT CONTRAST TECHNIQUE: Multiplanar, multiecho pulse sequences of the brain and surrounding structures were obtained without intravenous contrast. Angiographic images of the head were obtained using MRA technique without contrast. COMPARISON:  09/11/2016 MRI and MRA of the head. 11/14/2016 CT of the head. FINDINGS: MRI HEAD FINDINGS Brain: For subcentimeter foci of reduced diffusion are present, 3 in the left frontal lobe and 1 in the right frontoparietal junction compatible with acute/ early subacute infarction (series 100, image 43 and 44). No associated hemorrhage or mass effect. 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 Stable background of mild chronic microvascular ischemic changes and parenchymal volume loss of the brain for age. No hydrocephalus, extra-axial collection, or effacement of basilar cisterns. Vascular: Normal flow voids. Skull and upper cervical spine: Normal marrow signal. Sinuses/Orbits: Partial opacification of mastoid air cells. No abnormal signal of paranasal sinuses.  Bilateral intra-ocular lens replacement. Other: None. MRA HEAD FINDINGS Internal carotid arteries: Patent. Mild lumen irregularity without significant stenosis. Anterior cerebral arteries: Large left A1, no appreciable right A1, and large anterior communicating artery, normal variant. Middle cerebral arteries: Patent. Mild M1 segment lumen irregularity without significant stenosis. Anterior communicating artery: Patent. Posterior communicating arteries: Not identified, likely hypoplastic or absent. Posterior cerebral arteries:  Patent. Basilar artery:  Patent. Vertebral arteries:  Patent. No evidence of high-grade stenosis, large vessel occlusion, or aneurysm. IMPRESSION: MRI head: 1. Subcentimeter foci of cortical acute/early subacute infarction are present in left frontal lobe and right frontoparietal junction. Multiple vascular territories suggests embolic phenomenon. 2. Stable mild chronic microvascular ischemic changes and parenchymal volume loss of the brain. 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 MRA head: Patent circle of Willis. No large vessel occlusion, aneurysm, or significant stenosis is identified. These results will be called to the ordering clinician or representative by the Radiologist Assistant, and communication documented in the PACS or zVision Dashboard. Electronically Signed   By: Mitzi Hansen M.D.   On: 11/15/2016 06:23   Mr Maxine Glenn Head/brain YQ Cm  Result Date: 11/15/2016 CLINICAL DATA:  76 y/o  F; vertigo, episodic, peripheral. EXAM: MRI HEAD WITHOUT CONTRAST MRA HEAD WITHOUT CONTRAST TECHNIQUE: Multiplanar, multiecho pulse sequences of the brain and surrounding structures were obtained without intravenous contrast. Angiographic images of the head were obtained using MRA technique without contrast. COMPARISON:  09/11/2016 MRI and MRA of the head. 11/14/2016 CT of the head. FINDINGS: MRI HEAD FINDINGS Brain: For subcentimeter foci of reduced diffusion are  present, 3 in the left frontal lobe and 1 in the right frontoparietal junction compatible with acute/ early subacute infarction (series 100, image 43 and 44). No associated hemorrhage or mass effect. 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 Stable background of mild chronic microvascular ischemic changes and parenchymal volume loss of the brain for age. No hydrocephalus, extra-axial collection, or effacement of basilar cisterns. Vascular: Normal flow voids. Skull and upper cervical spine: Normal marrow signal. Sinuses/Orbits: Partial opacification of mastoid air cells. No abnormal signal of paranasal sinuses. Bilateral intra-ocular lens replacement. Other: None. MRA HEAD FINDINGS Internal carotid arteries: Patent. Mild lumen irregularity without significant stenosis. Anterior cerebral arteries: Large left A1, no appreciable right A1, and large anterior communicating artery, normal variant. Middle cerebral arteries: Patent. Mild M1 segment lumen irregularity  without significant stenosis. Anterior communicating artery: Patent. Posterior communicating arteries: Not identified, likely hypoplastic or absent. Posterior cerebral arteries:  Patent. Basilar artery:  Patent. Vertebral arteries:  Patent. No evidence of high-grade stenosis, large vessel occlusion, or aneurysm. IMPRESSION: MRI head: 1. Subcentimeter foci of cortical acute/early subacute infarction are present in left frontal lobe and right frontoparietal junction. Multiple vascular territories suggests embolic phenomenon. 2. Stable mild chronic microvascular ischemic changes and parenchymal volume loss of the brain. 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 MRA head: Patent circle of Willis. No large vessel occlusion, aneurysm, or significant stenosis is identified. These results will be called to the ordering clinician or representative by the Radiologist Assistant, and communication documented in the PACS or zVision Dashboard.  Electronically Signed   By: Mitzi Hansen M.D.   On: 11/15/2016 06:23   Ct Head Code Stroke Wo Contrast  Result Date: 11/14/2016 CLINICAL DATA:  Code stroke. Focal neuro deficit less than 6 hours, stroke suspected. EXAM: CT HEAD WITHOUT CONTRAST TECHNIQUE: Contiguous axial images were obtained from the base of the skull through the vertex without intravenous contrast. COMPARISON:  MRI head 09/11/2016 FINDINGS: Brain: Mild atrophy. Chronic microvascular ischemic change in the white matter. Negative for acute infarct. Negative for hemorrhage or mass. Vascular: Negative for hyperdense vessel Skull: Negative Sinuses/Orbits: Negative Other: None ASPECTS (Alberta Stroke Program Early CT Score) - Ganglionic level infarction (caudate, lentiform nuclei, internal capsule, insula, M1-M3 cortex): 7 - Supraganglionic infarction (M4-M6 cortex): 3 Total score (0-10 with 10 being normal): 10 IMPRESSION: 1. No acute abnormality. Atrophy with chronic microvascular ischemia. 2. ASPECTS is 10 These results were called by telephone at the time of interpretation on 11/14/2016 at 7:48 pm to Dr. Nita Sickle , who verbally acknowledged these results. Electronically Signed   By: Marlan Palau M.D.   On: 11/14/2016 19:49    EKG: Sinus bradycardia  ASSESSMENT AND PLAN:  1.  Sinus bradycardia with heart rate currently 44 bpm, not on rate controlling medications. 2.  Acute CVA with history of stroke in August 2018, with recent brain MRI revealing subcentimeter foci of cortical acute/early subacute infarction present in left frontal lobe and right frontoparietal junction. Multiple vascular territories suggests embolic phenomenon. Currently on Plavix. Aspirin was discontinued as outpatient. Carotid ultrasound revealing insignificant disease. 3. Hypotension, blood pressure stable at this time.   Recommendations: 1. Agree with overall therapy. 2. Review 2D echocardiogram 3. Continue to monitor on telemetry,  particularly for atrial fibrillation. Will obtain Holter or Linq monitor on an outpatient basis if necessary. 4. Continue aspirin, Plavix, and pravastatin. 5. Defer permanent pacemaker implantation at this time; we will continue to monitor.  Signed: Leanora Ivanoff PA-C 11/15/2016, 9:17 AM

## 2016-11-15 NOTE — Evaluation (Signed)
Physical Therapy Evaluation Patient Details Name: Maria Horton MRN: [id number] DOB: 08-13-1940 Today's Date: 11/15/2016   History of Present Illness  Patient is a 76 y.o. female admitted on 21 AUG to ED s/p mechanical fall vs syncope.  Pt demonstrating expressive aphasia and gait/balance impairments. Recent admission (09/11/16) for difficulty speaking/TIA. PMHx includes anxiety, chronic LBP, depression, fibromyalgia, diverticulosis, gastroparesis, HTN, and vascular dementia.  Clinical Impression  Prior to hospital admission, pt was independent (occasionally using SPC).  Pt lives in a 1 level home with stairs to enter.  Currently pt is modified independent with bed mobility, SBA with transfers, and CGA with ambulation.  Initially trialed ambulation without AD but pt unsteady and c/o concerns of R knee giving out.  Trialed pt with SPC and pt initially doing fairly well but with distance c/o R>L LE weakness and feeling like LE's were going to give out (no buckling noted during session).  Pt's HR initially 55 bpm at rest and with/after activity decreased to 42 bpm (pt c/o some lightheadedness; BP 148/51): nursing notified of above vitals and mobility concerns.  Will trial pt with SPC vs RW next session as appropriate.  Pt would benefit from skilled PT to address noted impairments and functional limitations (see below for any additional details).  Upon hospital discharge, recommend pt discharge to home with HHPT.    Follow Up Recommendations Home health PT    Equipment Recommendations  Rolling walker with 5" wheels    Recommendations for Other Services       Precautions / Restrictions Precautions Precautions: Fall Precaution Comments: Monitor HR Restrictions Weight Bearing Restrictions: No      Mobility  Bed Mobility Overal bed mobility: Modified Independent             General bed mobility comments: supine to/from sit with HOB elevated; mild increased effort to  perform  Transfers Overall transfer level: Needs assistance Equipment used: None Transfers: Sit to/from Stand Sit to Stand: Supervision         General transfer comment: steady with transfers; strong stand  Ambulation/Gait Ambulation/Gait assistance: Min guard Ambulation Distance (Feet):  (80 feet no AD; 110 feet SPC) Assistive device: None;Straight cane   Gait velocity: decreased   General Gait Details: pt initially c/o R knee feeling like it was going to "give" so pt assisted back to room and SPC obtained; [facility] but with distance c/o R>L LE weakness and feeling like LE's were going to give out (no actual buckling noted); mild increased B lateral sway and unsteadiness noted though (pt CGA with mobility for safety)  Stairs            Wheelchair Mobility    Modified Rankin (Stroke Patients Only)       Balance Overall balance assessment: History of Falls;Needs assistance Sitting-balance support: No upper extremity supported;Feet supported Sitting balance-Leahy Scale: Normal Sitting balance - Comments: sitting reaching outside BOS   Standing balance support: No upper extremity supported Standing balance-Leahy Scale: Good Standing balance comment: standing reaching within BOS                             Pertinent Vitals/Pain Pain Assessment: No/denies pain  O2 WFL on room air during session.    Home Living Family/patient expects to be discharged to:: Private residence Living Arrangements: Non-relatives/Friends (friend rents a room from her) Available Help at Discharge: Family;Available PRN/intermittently;Other (Comment) ([place]) Type of Home:  House Home Access: Stairs to enter Entrance Stairs-Rails: None (pt plans to have B rails installed) Entrance Stairs-Number of Steps: 3 Home Layout: One level Home Equipment: Cane - single point      Prior Function Level of Independence: Independent          Comments: Pt independent with ADL, mobility (PRN use of SPC on R when knee bothers her), driving, dtr helps with cleaning sometimes, pt tends to eat out for meals or does light meal prep at home. Endorses 3 falls in past 12 months (LOB in yard pulling weeds, tripped down 1 step when she wasn't watching where she was going, and one leading to this admission where her legs gave out)     Hand Dominance   Dominant Hand: Right    Extremity/Trunk Assessment   Upper Extremity Assessment Upper Extremity Assessment: Defer to OT evaluation    Lower Extremity Assessment Lower Extremity Assessment:  (Intact B LE strength, coordination, tone, and proprioception.  Decreased sensation plantar surface R great toe.)    Cervical / Trunk Assessment Cervical / Trunk Assessment: Normal  Communication   Communication: No difficulties  Cognition Arousal/Alertness: Awake/alert Behavior During Therapy: WFL for tasks assessed/performed Overall Cognitive Status: Within Functional Limits for tasks assessed                                        General Comments General comments (skin integrity, edema, etc.): Pt resting in bed upon PT arrival.  Nursing cleared pt for participation in physical therapy.  Pt agreeable to PT session.    Exercises    Assessment/Plan    PT Assessment Patient needs continued PT services  PT Problem List Decreased strength;Decreased activity tolerance;Decreased balance;Decreased mobility;Decreased knowledge of use of DME;Decreased knowledge of precautions       PT Treatment Interventions DME instruction;Gait training;Stair training;Functional mobility training;Therapeutic activities;Therapeutic exercise;Balance training;Patient/family education    PT Goals (Current goals can be found in the Care Plan section)  Acute Rehab PT Goals Patient Stated Goal: go home PT Goal Formulation: With patient Time For Goal Achievement: 11/29/16 Potential to Achieve  Goals: Good    Frequency 7X/week   Barriers to discharge        Co-evaluation               AM-PAC PT "6 Clicks" Daily Activity  Outcome Measure Difficulty turning over in bed (including adjusting bedclothes, sheets and blankets)?: None Difficulty moving from lying on back to sitting on the side of the bed? : A Little Difficulty sitting down on and standing up from a chair with arms (e.g., wheelchair, bedside commode, etc,.)?: Unable Help needed moving to and from a bed to chair (including a wheelchair)?: A Little Help needed walking in hospital room?: A Little Help needed climbing 3-5 steps with a railing? : A Lot 6 Click Score: 16    End of Session Equipment Utilized During Treatment: Gait belt Activity Tolerance: Patient tolerated treatment well Patient left: in bed;with call bell/phone within reach;with bed alarm set Nurse Communication: Mobility status;Precautions PT Visit Diagnosis: Unsteadiness on feet (R26.81);Difficulty in walking, not elsewhere classified (R26.2);Muscle weakness (generalized) (M62.81);History of falling (Z91.81)    Time: 1400-1440 PT Time Calculation (min) (ACUTE ONLY): 40 min   Charges:   PT Evaluation $PT Eval Low Complexity: 1 Low PT Treatments $Gait Training: 8-22 mins $Therapeutic Activity: 8-22 mins   PT G Codes:  PT G-Codes **NOT FOR INPATIENT CLASS** Functional Assessment Tool Used: AM-PAC 6 Clicks Basic Mobility Functional Limitation: Mobility: Walking and moving around Mobility: Walking and Moving Around Current Status (W1191): At least 40 percent but less than 60 percent impaired, limited or restricted Mobility: Walking and Moving Around Goal Status [phone]): 0 percent impaired, limited or restricted    Hendricks Limes, PT 11/15/16, 4:53 PM [phone]

## 2016-11-15 NOTE — Evaluation (Signed)
Occupational Therapy Evaluation Patient Details Name: Maria Horton MRN: [id number] DOB: April 12, 1940 Today's Date: 11/15/2016    History of Present Illness Patient is a 76 y.o. female admitted on 21 AUG to ED s/p mechanical fall. Recent admission (09/11/16) for difficulty speaking/TIA. PMHx includes anxiety, chronic LBP, depression, diverticulosis, gastroparesis, HTN, and vascular dementia.   Clinical Impression   Pt seen for OT evaluation this date. Pt was independent at baseline with ADL and IADL, occasionally using a SPC. Endorses 3 falls in past 12 months. Pt reports having a roommate who rents a room from pt. Pt presents with no deficits in strength/coordination/sensation/vision/cognition. No dizziness, nausea, or speech difficulties noted upon assessment. Supervision level for transfers and mobility with no LOB noted during assessment. Pt able to demonstrate independence in self care tasks during assessment. Pt educated in signs/symptoms of a stroke and falls prevention strategies, verbalizing understanding of all education provided. No additional skilled OT needs at this time. Spoke with PT. May benefit from higher level PT evaluation to assess for balance. Will sign off. Please re-consult if additional needs should arise.     Follow Up Recommendations  No OT follow up    Equipment Recommendations  None recommended by OT    Recommendations for Other Services       Precautions / Restrictions Precautions Precautions: Fall Restrictions Weight Bearing Restrictions: No      Mobility Bed Mobility Overal bed mobility: Modified Independent             General bed mobility comments: slight use of bed rails, no physical assist required, good balance  Transfers Overall transfer level: Needs assistance Equipment used: None Transfers: Sit to/from Stand Sit to Stand: Supervision         General transfer comment: good balance, strength, no LOB, good safety awareness during  transfers from EOB and toilet    Balance Overall balance assessment: History of Falls;Needs assistance Sitting-balance support: No upper extremity supported;Feet supported Sitting balance-Leahy Scale: Normal     Standing balance support: No upper extremity supported Standing balance-Leahy Scale: Good                             ADL either performed or assessed with clinical judgement   ADL Overall ADL's : At baseline;Independent                                       General ADL Comments: Pt able to perform all mobility, dressing, toileting, self feeding, and grooming tasks at independent baseline level this date. No LOB noted, no difficulty performing.     Vision Baseline Vision/History: Wears glasses Wears Glasses: Reading only Patient Visual Report: No change from baseline Vision Assessment?: No apparent visual deficits     Perception     Praxis      Pertinent Vitals/Pain Pain Assessment: No/denies pain     Hand Dominance Right   Extremity/Trunk Assessment Upper Extremity Assessment Upper Extremity Assessment: Overall WFL for tasks assessed (intact sensation/coordination/strength)   Lower Extremity Assessment Lower Extremity Assessment: Overall WFL for tasks assessed (intact sensation/coordination/strength)   Cervical / Trunk Assessment Cervical / Trunk Assessment: Normal   Communication Communication Communication: No difficulties   Cognition Arousal/Alertness: Awake/alert Behavior During Therapy: WFL for tasks assessed/performed Overall Cognitive Status: Within Functional Limits for tasks assessed  General Comments       Exercises Other Exercises Other Exercises: Pt educated in signs/symptoms of a stroke and pt verbalized understanding Other Exercises: Pt educated in body/environmental positioning and falls prevention strategies to maximize safety in the home. Pt verbalized  understanding.   Shoulder Instructions      Home Living Family/patient expects to be discharged to:: Private residence Living Arrangements: Non-relatives/Friends (pt reports that she has someone renting a room from her ) Available Help at Discharge: Family;Available PRN/intermittently;Other (Comment) (dtr lives in [place]) Type of Home: House Home Access: Stairs to enter Secretary/administratorntrance Stairs-Number of Steps: 3 Entrance Stairs-Rails: None (pt plans to have bilateral rails installed) Home Layout: One level     Bathroom Shower/Tub: Chief Strategy OfficerTub/shower unit   Bathroom Toilet: Standard Bathroom Accessibility: No   Home Equipment: Cane - single point          Prior Functioning/Environment Level of Independence: Independent        Comments: Pt independent with ADL, mobility (PRN use of SPC on R when knee bothers her), driving, dtr helps with cleaning sometimes, pt tends to eat out for meals or does light meal prep at home. Endorses 3 falls in past 12 months (LOB in yard pulling weeds, tripped down 1 step when she wasn't watching where she was going, and one leading to this admission where her legs gave out)        OT Problem List:        OT Treatment/Interventions:      OT Goals(Current goals can be found in the care plan section) Acute Rehab OT Goals Patient Stated Goal: go home OT Goal Formulation: All assessment and education complete, DC therapy Time For Goal Achievement: 11/15/16 Potential to Achieve Goals: Good  OT Frequency:     Barriers to D/C:            Co-evaluation              AM-PAC PT "6 Clicks" Daily Activity     Outcome Measure Help from another person eating meals?: None Help from another person taking care of personal grooming?: None Help from another person toileting, which includes using toliet, bedpan, or urinal?: None Help from another person bathing (including washing, rinsing, drying)?: None Help from another person to put on and taking off regular  upper body clothing?: None Help from another person to put on and taking off regular lower body clothing?: None 6 Click Score: 24   End of Session Equipment Utilized During Treatment: Gait belt  Activity Tolerance: Patient tolerated treatment well Patient left: in bed;with call bell/phone within reach;with bed alarm set  OT Visit Diagnosis: Other abnormalities of gait and mobility (R26.89)                Time: [id number] OT Time Calculation (min): 30 min Charges:  OT General Charges $OT Visit: 1 Visit OT Evaluation $OT Eval Low Complexity: 1 Low OT Treatments $Self Care/Home Management : 8-22 mins G-Codes: OT G-codes **NOT FOR INPATIENT CLASS** Functional Assessment Tool Used: AM-PAC 6 Clicks Daily Activity;Clinical judgement Functional Limitation: Self care Self Care Current Status (V5643(G8987): 0 percent impaired, limited or restricted Self Care Goal Status (P2951(G8988): 0 percent impaired, limited or restricted Self Care Discharge Status (O8416(G8989): 0 percent impaired, limited or restricted   Richrd PrimeJamie Stiller, MPH, MS, OTR/L ascom [phone] 11/15/16, 2:03 PM

## 2016-11-15 NOTE — Progress Notes (Signed)
*  PRELIMINARY RESULTS* Echocardiogram 2D Echocardiogram has been performed.  Cristela BlueHege, Jeffrey Voth 11/15/2016, 10:04 AM

## 2016-11-15 NOTE — Progress Notes (Signed)
OT Cancellation Note  Patient Details Name: Maria Horton MRN: [id number] DOB: 25-Jan-1941   Cancelled Treatment:    Reason Eval/Treat Not Completed: Patient declined, no reason specified. Order received, chart reviewed. Upon attempt, pt recently back from testing. Family asleep in recliner. Pt noting some dizziness which she contributes to not sleeping at all overnight. Requests OT and PT come back at later time to allow pt to rest this morning. Will re-attempt OT evaluation in afternoon as schedule permits.   Richrd PrimeJamie Stiller, MPH, MS, OTR/L ascom [phone] 11/15/16, 10:46 AM

## 2016-11-15 NOTE — Care Management Note (Addendum)
Case Management Note  Patient Details  Name: Maria Horton MRN: [id number] Date of Birth: 08-Oct-1940  Subjective/Objective:   Admitted to [facility] under observation status with the diagnosis of post fall. Lives alone. Last seen Dr, Chestine Sporelark a couple of weeks ago. Daughter is Arnoldo MoraleLauna [phone]). Prescriptions are filled at [facility] [place]. [facility]. No skilled nursing.    No home oxygen.   Uses a cane to aid in ambulation. Takes care of all basic activities of daily living herself, drives. Larey SeatFell prior to this admission. Fair appetite. Daughter will transport              Action/Plan: Requested information about physicians accepting new patients. = given   Expected Discharge Date:                  Expected Discharge Plan:     In-House Referral:     Discharge planning Services     Post Acute Care Choice:    Choice offered to:     DME Arranged:    DME Agency:     HH Arranged:    HH Agency:     Status of Service:     If discussed at [employer] Meetings, dates discussed:    Additional Comments:  Gwenette GreetBrenda S Trigo Winterbottom, RN MSN [facility] Care Management [phone] 11/15/2016, 10:19 AM

## 2016-11-15 NOTE — Progress Notes (Signed)
OT Cancellation Note  Patient Details Name: Maria Horton MRN: [id number] DOB: February 24, 1940   Cancelled Treatment:    Reason Eval/Treat Not Completed: Patient at procedure or test/ unavailable. Order received, chart reviewed. Pt out of room for testing. Will re-attempt OT evaluation at later date/time as pt is available and medically appropriate.  Richrd PrimeJamie Stiller, MPH, MS, OTR/L ascom [phone] 11/15/16, 8:54 AM

## 2016-11-15 NOTE — Progress Notes (Signed)
PT Cancellation Note  Patient Details Name: Maria Horton MRN: [id number] DOB: October 24, 1940   Cancelled Treatment:    Reason Eval/Treat Not Completed: Other (comment).  Per OT, pt requesting OT and PT to come back at later time to allow pt to rest this morning.  Will re-attempt PT evaluation in afternoon as able.  Hendricks LimesEmily Destaney Sarkis, PT 11/15/16, 11:13 AM [phone]

## 2016-11-16 DIAGNOSIS — F0150 Vascular dementia without behavioral disturbance: Secondary | ICD-10-CM | POA: Diagnosis present

## 2016-11-16 DIAGNOSIS — Z7902 Long term (current) use of antithrombotics/antiplatelets: Secondary | ICD-10-CM | POA: Diagnosis not present

## 2016-11-16 DIAGNOSIS — K219 Gastro-esophageal reflux disease without esophagitis: Secondary | ICD-10-CM | POA: Diagnosis present

## 2016-11-16 DIAGNOSIS — R29700 NIHSS score 0: Secondary | ICD-10-CM | POA: Diagnosis present

## 2016-11-16 DIAGNOSIS — F419 Anxiety disorder, unspecified: Secondary | ICD-10-CM | POA: Diagnosis present

## 2016-11-16 DIAGNOSIS — R55 Syncope and collapse: Secondary | ICD-10-CM | POA: Diagnosis present

## 2016-11-16 DIAGNOSIS — R001 Bradycardia, unspecified: Secondary | ICD-10-CM | POA: Diagnosis present

## 2016-11-16 DIAGNOSIS — F329 Major depressive disorder, single episode, unspecified: Secondary | ICD-10-CM | POA: Diagnosis present

## 2016-11-16 DIAGNOSIS — M797 Fibromyalgia: Secondary | ICD-10-CM | POA: Diagnosis present

## 2016-11-16 DIAGNOSIS — I959 Hypotension, unspecified: Secondary | ICD-10-CM | POA: Diagnosis present

## 2016-11-16 DIAGNOSIS — Z8673 Personal history of transient ischemic attack (TIA), and cerebral infarction without residual deficits: Secondary | ICD-10-CM | POA: Diagnosis not present

## 2016-11-16 DIAGNOSIS — I639 Cerebral infarction, unspecified: Secondary | ICD-10-CM | POA: Diagnosis present

## 2016-11-16 DIAGNOSIS — Z79899 Other long term (current) drug therapy: Secondary | ICD-10-CM | POA: Diagnosis not present

## 2016-11-16 DIAGNOSIS — N179 Acute kidney failure, unspecified: Secondary | ICD-10-CM | POA: Diagnosis present

## 2016-11-16 DIAGNOSIS — K3184 Gastroparesis: Secondary | ICD-10-CM | POA: Diagnosis present

## 2016-11-16 DIAGNOSIS — Z888 Allergy status to other drugs, medicaments and biological substances status: Secondary | ICD-10-CM | POA: Diagnosis not present

## 2016-11-16 DIAGNOSIS — R4701 Aphasia: Secondary | ICD-10-CM | POA: Diagnosis present

## 2016-11-16 DIAGNOSIS — N189 Chronic kidney disease, unspecified: Secondary | ICD-10-CM | POA: Diagnosis present

## 2016-11-16 DIAGNOSIS — I129 Hypertensive chronic kidney disease with stage 1 through stage 4 chronic kidney disease, or unspecified chronic kidney disease: Secondary | ICD-10-CM | POA: Diagnosis present

## 2016-11-16 DIAGNOSIS — E785 Hyperlipidemia, unspecified: Secondary | ICD-10-CM | POA: Diagnosis present

## 2016-11-16 MED ORDER — ASPIRIN EC 81 MG PO TBEC
81.0000 mg | DELAYED_RELEASE_TABLET | Freq: Every day | ORAL | Status: DC
  Administered 2016-11-16 – 2016-11-17 (×2): 81 mg via ORAL
  Filled 2016-11-16 (×2): qty 1

## 2016-11-16 MED ORDER — PRAVASTATIN SODIUM 20 MG PO TABS
40.0000 mg | ORAL_TABLET | Freq: Every evening | ORAL | Status: DC
  Administered 2016-11-16: 40 mg via ORAL
  Filled 2016-11-16: qty 2

## 2016-11-16 NOTE — Care Management (Signed)
Physical therapy evaluation completed. Recommending Home with Home Health and physical therapy, Discussed agencies with [name]. Chose [facility]. Will update Maria Horton, [facility] representative. Discharge to home 11/17/16 Gwenette GreetBrenda S Geneieve Duell RN MSN [facility] Care Management [phone]

## 2016-11-16 NOTE — Progress Notes (Signed)
PT Cancellation Note  Patient Details Name: Maria Horton MRN: [id number] DOB: October 23, 1940   Cancelled Treatment:    Reason Eval/Treat Not Completed: Other (comment).  Per discussion with nursing, will hold PT at this time d/t HR concerns.  Will re-attempt PT treatment at a later date/time.  Hendricks LimesEmily Zakiyyah Savannah, PT 11/16/16, 11:08 AM [phone]

## 2016-11-16 NOTE — Progress Notes (Signed)
[employer] - [place] at [facility]                                                                                                                                                                                  Patient Demographics   Maria Horton, is a 76 y.o. female, DOB - Jul 10, 1940, ZOX:[id number]  Admit date - 11/14/2016   Admitting Physician Altamese Dilling, MD  Outpatient Primary MD for the patient is No primary care provider on file.   LOS - 0  Subjective: Patient feeling little weak Overnight heart rate was 27 and blood pressure was low  Review of Systems:   CONSTITUTIONAL: No documented fever. No fatigue, positive weakness. No weight gain, no weight loss.  EYES: No blurry or double vision.  ENT: No tinnitus. No postnasal drip. No redness of the oropharynx.  RESPIRATORY: No cough, no wheeze, no hemoptysis. No dyspnea.  CARDIOVASCULAR: No chest pain. No orthopnea. No palpitations. No syncope.  GASTROINTESTINAL: No nausea, no vomiting or diarrhea. No abdominal pain. No melena or hematochezia.  GENITOURINARY: No dysuria or hematuria.  ENDOCRINE: No polyuria or nocturia. No heat or cold intolerance.  HEMATOLOGY: No anemia. No bruising. No bleeding.  INTEGUMENTARY: No rashes. No lesions.  MUSCULOSKELETAL: No arthritis. No swelling. No gout.  NEUROLOGIC: No numbness, tingling, or ataxia. No seizure-type activity.  PSYCHIATRIC: No anxiety. No insomnia. No ADD.    Vitals:   Vitals:   11/15/16 2358 11/16/16 0412 11/16/16 0830 11/16/16 1411  BP: (!) 140/48 107/84 (!) 92/43 (!) 144/48  Pulse: (!) 44 (!) 45  (!) 50  Resp: 14 15 16    Temp: 99.2 F (37.3 C) 97.9 F (36.6 C) 98 F (36.7 C) 98.9 F (37.2 C)  TempSrc: Oral Oral Oral Oral  SpO2: 97% 98% 99% 98%  Weight:      Height:        Wt Readings from Last 3 Encounters:  11/14/16 140 lb (63.5 kg)  10/29/16 143 lb 6.4 oz (65 kg)  10/15/16 142 lb 6.4 oz (64.6 kg)     Intake/Output Summary  (Last 24 hours) at 11/16/16 1607 Last data filed at 11/16/16 1606  Gross per 24 hour  Intake             2185 ml  Output                0 ml  Net             2185 ml    Physical Exam:   GENERAL: Pleasant-appearing in no apparent distress.  HEAD, EYES, EARS, NOSE AND THROAT: Atraumatic, normocephalic. Extraocular muscles are intact. Pupils equal and reactive to light.  Sclerae anicteric. No conjunctival injection. No oro-pharyngeal erythema.  NECK: Supple. There is no jugular venous distention. No bruits, no lymphadenopathy, no thyromegaly.  HEART: Regular rate and rhythm,. No murmurs, no rubs, no clicks.  LUNGS: Clear to auscultation bilaterally. No rales or rhonchi. No wheezes.  ABDOMEN: Soft, flat, nontender, nondistended. Has good bowel sounds. No hepatosplenomegaly appreciated.  EXTREMITIES: No evidence of any cyanosis, clubbing, or peripheral edema.  +2 pedal and radial pulses bilaterally.  NEUROLOGIC: The patient is alert, awake, and oriented x3 with no focal motor or sensory deficits appreciated bilaterally.  SKIN: Moist and warm with no rashes appreciated.  Psych: Not anxious, depressed LN: No inguinal LN enlargement    Antibiotics   Anti-infectives    None      Medications   Scheduled Meds: . aspirin EC  81 mg Oral Daily  . citalopram  20 mg Oral Daily  . clonazePAM  0.5 mg Oral QHS  . clopidogrel  75 mg Oral Daily  . gabapentin  100 mg Oral BID  . heparin  5,000 Units Subcutaneous Q8H  . pravastatin  40 mg Oral QPM  . traZODone  100 mg Oral QHS   Continuous Infusions: . sodium chloride 50 mL/hr at 11/15/16 2219   PRN Meds:.acetaminophen **OR** acetaminophen (TYLENOL) oral liquid 160 mg/5 mL **OR** acetaminophen, albuterol, diphenhydrAMINE, fluticasone, senna-docusate   Data Review:   Micro Results No results found for this or any previous visit (from the past 240 hour(s)).  Radiology Reports Dg Chest 2 View  Result Date: 11/14/2016 CLINICAL DATA:   Acute onset of syncope.  Initial encounter. EXAM: CHEST  2 VIEW COMPARISON:  None. FINDINGS: The lungs are well-aerated. There is elevation of the right hemidiaphragm. There is no evidence of focal opacification, pleural effusion or pneumothorax. The deep sulcus on the right side is thought to reflect a normal variant. The heart is normal in size; the mediastinal contour is within normal limits. No acute osseous abnormalities are seen. Cervical spinal fusion hardware is noted. IMPRESSION: Elevation of the right hemidiaphragm. No acute cardiopulmonary process seen. Electronically Signed   By: Roanna RaiderJeffery  Chang M.D.   On: 11/14/2016 22:59   Mr Brain Wo Contrast  Result Date: 11/15/2016 CLINICAL DATA:  76 y/o  F; vertigo, episodic, peripheral. EXAM: MRI HEAD WITHOUT CONTRAST MRA HEAD WITHOUT CONTRAST TECHNIQUE: Multiplanar, multiecho pulse sequences of the brain and surrounding structures were obtained without intravenous contrast. Angiographic images of the head were obtained using MRA technique without contrast. COMPARISON:  09/11/2016 MRI and MRA of the head. 11/14/2016 CT of the head. FINDINGS: MRI HEAD FINDINGS Brain: For subcentimeter foci of reduced diffusion are present, 3 in the left frontal lobe and 1 in the right frontoparietal junction compatible with acute/ early subacute infarction (series 100, image 43 and 44). No associated hemorrhage or mass effect. 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 Stable background of mild chronic microvascular ischemic changes and parenchymal volume loss of the brain for age. No hydrocephalus, extra-axial collection, or effacement of basilar cisterns. Vascular: Normal flow voids. Skull and upper cervical spine: Normal marrow signal. Sinuses/Orbits: Partial opacification of mastoid air cells. No abnormal signal of paranasal sinuses. Bilateral intra-ocular lens replacement. Other: None. MRA HEAD FINDINGS Internal carotid arteries: Patent. Mild lumen irregularity  without significant stenosis. Anterior cerebral arteries: Large left A1, no appreciable right A1, and large anterior communicating artery, normal variant. Middle cerebral arteries: Patent. Mild M1 segment lumen irregularity without significant stenosis. Anterior communicating artery: Patent. Posterior communicating arteries: Not  identified, likely hypoplastic or absent. Posterior cerebral arteries:  Patent. Basilar artery:  Patent. Vertebral arteries:  Patent. No evidence of high-grade stenosis, large vessel occlusion, or aneurysm. IMPRESSION: MRI head: 1. Subcentimeter foci of cortical acute/early subacute infarction are present in left frontal lobe and right frontoparietal junction. Multiple vascular territories suggests embolic phenomenon. 2. Stable mild chronic microvascular ischemic changes and parenchymal volume loss of the brain. 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 MRA head: Patent circle of Willis. No large vessel occlusion, aneurysm, or significant stenosis is identified. These results will be called to the ordering clinician or representative by the Radiologist Assistant, and communication documented in the PACS or zVision Dashboard. Electronically Signed   By: Mitzi Hansen M.D.   On: 11/15/2016 06:23   US Carotid Bilateral (at [facility] And [facility] Only)  Result Date: 11/15/2016 CLINICAL DATA:  Cerebral infarction, syncope and hypertension. EXAM: BILATERAL CAROTID DUPLEX ULTRASOUND TECHNIQUE: Wallace Cullens scale imaging, color Doppler and duplex ultrasound were performed of bilateral carotid and vertebral arteries in the neck. COMPARISON:  None. FINDINGS: Criteria: Quantification of carotid stenosis is based on velocity parameters that correlate the residual internal carotid diameter with NASCET-based stenosis levels, using the diameter of the distal internal carotid lumen as the denominator for stenosis measurement. The following velocity measurements were obtained: RIGHT ICA:  51/12  cm/sec CCA:  63/9 cm/sec SYSTOLIC ICA/CCA RATIO:  0.8 DIASTOLIC ICA/CCA RATIO:  1.3 ECA:  79 cm/sec LEFT ICA:  59/16 cm/sec CCA:  66/13 cm/sec SYSTOLIC ICA/CCA RATIO:  0.9 DIASTOLIC ICA/CCA RATIO:  1.3 ECA:  66 cm/sec RIGHT CAROTID ARTERY: No significant plaque identified. Velocities and waveforms are normal. No evidence of right carotid stenosis in the neck. RIGHT VERTEBRAL ARTERY: Antegrade flow with normal waveform and velocity. LEFT CAROTID ARTERY: Mild amount of plaque at the level of the carotid bulb and proximal ICA. No significant stenosis identified with estimated less than 50% left ICA stenosis. LEFT VERTEBRAL ARTERY: Antegrade flow with normal waveform and velocity. Incidental small left thyroid nodules of 1 cm or less in diameter. These have a spongiform appearance by ultrasound and are likely benign. The entire thyroid gland was not imaged. IMPRESSION: 1. Mild amount of plaque at the level of the left carotid bulb and proximal ICA. Estimated left ICA stenosis is less than 50%. 2. No evidence of right carotid stenosis. 3. Incidental small left thyroid nodules which appear spongiform and benign. Electronically Signed   By: Irish Lack M.D.   On: 11/15/2016 10:00   Mr Maxine Glenn Head/brain ZO Cm  Result Date: 11/15/2016 CLINICAL DATA:  76 y/o  F; vertigo, episodic, peripheral. EXAM: MRI HEAD WITHOUT CONTRAST MRA HEAD WITHOUT CONTRAST TECHNIQUE: Multiplanar, multiecho pulse sequences of the brain and surrounding structures were obtained without intravenous contrast. Angiographic images of the head were obtained using MRA technique without contrast. COMPARISON:  09/11/2016 MRI and MRA of the head. 11/14/2016 CT of the head. FINDINGS: MRI HEAD FINDINGS Brain: For subcentimeter foci of reduced diffusion are present, 3 in the left frontal lobe and 1 in the right frontoparietal junction compatible with acute/ early subacute infarction (series 100, image 43 and 44). No associated hemorrhage or mass effect.  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 Stable background of mild chronic microvascular ischemic changes and parenchymal volume loss of the brain for age. No hydrocephalus, extra-axial collection, or effacement of basilar cisterns. Vascular: Normal flow voids. Skull and upper cervical spine: Normal marrow signal. Sinuses/Orbits: Partial opacification of mastoid air cells. No abnormal signal of  paranasal sinuses. Bilateral intra-ocular lens replacement. Other: None. MRA HEAD FINDINGS Internal carotid arteries: Patent. Mild lumen irregularity without significant stenosis. Anterior cerebral arteries: Large left A1, no appreciable right A1, and large anterior communicating artery, normal variant. Middle cerebral arteries: Patent. Mild M1 segment lumen irregularity without significant stenosis. Anterior communicating artery: Patent. Posterior communicating arteries: Not identified, likely hypoplastic or absent. Posterior cerebral arteries:  Patent. Basilar artery:  Patent. Vertebral arteries:  Patent. No evidence of high-grade stenosis, large vessel occlusion, or aneurysm. IMPRESSION: MRI head: 1. Subcentimeter foci of cortical acute/early subacute infarction are present in left frontal lobe and right frontoparietal junction. Multiple vascular territories suggests embolic phenomenon. 2. Stable mild chronic microvascular ischemic changes and parenchymal volume loss of the brain. 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 MRA head: Patent circle of Willis. No large vessel occlusion, aneurysm, or significant stenosis is identified. These results will be called to the ordering clinician or representative by the Radiologist Assistant, and communication documented in the PACS or zVision Dashboard. Electronically Signed   By: Mitzi Hansen M.D.   On: 11/15/2016 06:23   Ct Head Code Stroke Wo Contrast  Result Date: 11/14/2016 CLINICAL DATA:  Code stroke. Focal neuro deficit less than 6  hours, stroke suspected. EXAM: CT HEAD WITHOUT CONTRAST TECHNIQUE: Contiguous axial images were obtained from the base of the skull through the vertex without intravenous contrast. COMPARISON:  MRI head 09/11/2016 FINDINGS: Brain: Mild atrophy. Chronic microvascular ischemic change in the white matter. Negative for acute infarct. Negative for hemorrhage or mass. Vascular: Negative for hyperdense vessel Skull: Negative Sinuses/Orbits: Negative Other: None ASPECTS (Alberta Stroke Program Early CT Score) - Ganglionic level infarction (caudate, lentiform nuclei, internal capsule, insula, M1-M3 cortex): 7 - Supraganglionic infarction (M4-M6 cortex): 3 Total score (0-10 with 10 being normal): 10 IMPRESSION: 1. No acute abnormality. Atrophy with chronic microvascular ischemia. 2. ASPECTS is 10 These results were called by telephone at the time of interpretation on 11/14/2016 at 7:48 pm to Dr. Nita Sickle , who verbally acknowledged these results. Electronically Signed   By: Marlan Palau M.D.   On: 11/14/2016 19:49     CBC  Recent Labs Lab 11/14/16 1916  WBC 5.4  HGB 13.9  HCT 41.2  PLT 171  MCV 92.6  MCH 31.2  MCHC 33.7  RDW 14.5  LYMPHSABS 1.8  MONOABS 0.6  EOSABS 0.0  BASOSABS 0.0    Chemistries   Recent Labs Lab 11/14/16 1916  NA 138  K 3.5  CL 105  CO2 26  GLUCOSE 121*  BUN 21*  CREATININE 1.31*  CALCIUM 9.1  MG 2.0  AST 24  ALT 9*  ALKPHOS 42  BILITOT 1.0   ------------------------------------------------------------------------------------------------------------------ estimated creatinine clearance is 33.9 mL/min (A) (by C-G formula based on SCr of 1.31 mg/dL (H)). ------------------------------------------------------------------------------------------------------------------  Recent Labs  11/15/16 0652  HGBA1C 5.4   ------------------------------------------------------------------------------------------------------------------  Recent Labs   11/15/16 0652  CHOL 142  HDL 31*  LDLCALC 65  TRIG 696*  CHOLHDL 4.6   ------------------------------------------------------------------------------------------------------------------ No results for input(s): TSH, T4TOTAL, T3FREE, THYROIDAB in the last 72 hours.  Invalid input(s): FREET3 ------------------------------------------------------------------------------------------------------------------ No results for input(s): VITAMINB12, FOLATE, FERRITIN, TIBC, IRON, RETICCTPCT in the last 72 hours.  Coagulation profile  Recent Labs Lab 11/14/16 1916  INR 1.01    No results for input(s): DDIMER in the last 72 hours.  Cardiac Enzymes  Recent Labs Lab 11/14/16 1916  TROPONINI <0.03   ------------------------------------------------------------------------------------------------------------------ Invalid input(s): POCBNP    Assessment & Plan  Patient is a 76 year old admitted with dizziness and near syncope and a fall   * Dizziness and a fall  suspect this is related to her bradycardia and hypotension Now improved      * Bradycardia, hypotension Echo without any significant wall motion abnormality Heart rate continues to be low I will have to monitor patient will more night  *Acute CVA Continued therapy with aspirin and Plavix And pravastatin  *GERD continue PPIs  *Depression continue home regimen with Celexa and clonazepam  * misc: scd's due to low plt       Start     Ordered   11/14/16 2146  Full code  Continuous     11/14/16 2145    Code Status History    Date Active Date Inactive Code Status Order ID Comments User Context   09/10/2016  4:17 PM 09/11/2016  6:50 PM Full Code [id number]  Auburn Bilberry, MD Inpatient   09/26/2015  8:50 PM 10/01/2015  6:57 PM Full Code [id number]  Clapacs, Jackquline Denmark, MD Inpatient           Consults  Cardiology and neurology   DVT Prophylaxis  Lovenox   Lab Results  Component Value Date   PLT 171 11/14/2016      Time Spent in minutes  32 minutes Greater than 50% of time spent in care coordination and counseling patient regarding the condition and plan of care.   Auburn Bilberry M.D on 11/16/2016 at 4:07 PM  Between 7am to 6pm - Pager - [phone]  After 6pm go to [email or web address] - password [email or web address] [facility]  [facility]   Office  [phone]

## 2016-11-16 NOTE — Progress Notes (Signed)
Subjective: No new neurological complaints.    Objective: Current vital signs: BP (!) 92/43 (BP Location: Right Arm)   Pulse (!) 45   Temp 98 F (36.7 C) (Oral)   Resp 16   Ht 5\' 5"  (1.651 m)   Wt 63.5 kg (140 lb)   SpO2 99%   BMI 23.30 kg/m  Vital signs in last 24 hours: Temp:  [97.9 F (36.6 C)-99.2 F (37.3 C)] 98 F (36.7 C) (10/23 0830) Pulse Rate:  [42-48] 45 (10/23 0412) Resp:  [14-20] 16 (10/23 0830) BP: (92-141)/(40-84) 92/43 (10/23 0830) SpO2:  [97 %-99 %] 99 % (10/23 0830)  Intake/Output from previous day: 10/22 0701 - 10/23 0700 In: 1526.7 [P.O.:360; I.V.:1166.7] Out: -  Intake/Output this shift: Total I/O In: 240 [P.O.:240] Out: -  Nutritional status: Diet Heart Room service appropriate? Yes; Fluid consistency: Thin  Neurologic Exam: Mental Status: Alert, oriented, thought content appropriate.  Speech fluent without evidence of aphasia.  Able to follow 3 step commands without difficulty. Cranial Nerves: II: Discs flat bilaterally; Visual fields grossly normal, pupils equal, round, reactive to light and accommodation III,IV, VI: ptosis not present, extra-ocular motions intact bilaterally V,VII: smile symmetric, facial light touch sensation normal bilaterally VIII: hearing normal bilaterally IX,X: gag reflex present XI: bilateral shoulder shrug XII: midline tongue extension Motor: Right :  Upper extremity   5/5. 5-/5 right hand grip                                          Left:     Upper extremity   5/5             Lower extremity   5/5                                                                                      Lower extremity   5/5   Lab Results: Basic Metabolic Panel:  Recent Labs Lab 11/14/16 1916  NA 138  K 3.5  CL 105  CO2 26  GLUCOSE 121*  BUN 21*  CREATININE 1.31*  CALCIUM 9.1  MG 2.0    Liver Function Tests:  Recent Labs Lab 11/14/16 1916  AST 24  ALT 9*  ALKPHOS 42  BILITOT 1.0  PROT 6.5  ALBUMIN 3.7   No  results for input(s): LIPASE, AMYLASE in the last 168 hours. No results for input(s): AMMONIA in the last 168 hours.  CBC:  Recent Labs Lab 11/14/16 1916  WBC 5.4  NEUTROABS 2.9  HGB 13.9  HCT 41.2  MCV 92.6  PLT 171    Cardiac Enzymes:  Recent Labs Lab 11/14/16 1916  TROPONINI <0.03    Lipid Panel:  Recent Labs Lab 11/15/16 0652  CHOL 142  TRIG 232*  HDL 31*  CHOLHDL 4.6  VLDL 46*  LDLCALC 65    CBG: No results for input(s): GLUCAP in the last 168 hours.  Microbiology: No results found for this or any previous visit.  Coagulation Studies:  Recent Labs  11/14/16 1916  LABPROT 13.2  INR 1.01  Imaging: Dg Chest 2 View  Result Date: 11/14/2016 CLINICAL DATA:  Acute onset of syncope.  Initial encounter. EXAM: CHEST  2 VIEW COMPARISON:  None. FINDINGS: The lungs are well-aerated. There is elevation of the right hemidiaphragm. There is no evidence of focal opacification, pleural effusion or pneumothorax. The deep sulcus on the right side is thought to reflect a normal variant. The heart is normal in size; the mediastinal contour is within normal limits. No acute osseous abnormalities are seen. Cervical spinal fusion hardware is noted. IMPRESSION: Elevation of the right hemidiaphragm. No acute cardiopulmonary process seen. Electronically Signed   By: Roanna RaiderJeffery  Chang M.D.   On: 11/14/2016 22:59   Mr Brain Wo Contrast  Result Date: 11/15/2016 CLINICAL DATA:  76 y/o  F; vertigo, episodic, peripheral. EXAM: MRI HEAD WITHOUT CONTRAST MRA HEAD WITHOUT CONTRAST TECHNIQUE: Multiplanar, multiecho pulse sequences of the brain and surrounding structures were obtained without intravenous contrast. Angiographic images of the head were obtained using MRA technique without contrast. COMPARISON:  09/11/2016 MRI and MRA of the head. 11/14/2016 CT of the head. FINDINGS: MRI HEAD FINDINGS Brain: For subcentimeter foci of reduced diffusion are present, 3 in the left frontal lobe  and 1 in the right frontoparietal junction compatible with acute/ early subacute infarction (series 100, image 43 and 44). No associated hemorrhage or mass effect. 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 Stable background of mild chronic microvascular ischemic changes and parenchymal volume loss of the brain for age. No hydrocephalus, extra-axial collection, or effacement of basilar cisterns. Vascular: Normal flow voids. Skull and upper cervical spine: Normal marrow signal. Sinuses/Orbits: Partial opacification of mastoid air cells. No abnormal signal of paranasal sinuses. Bilateral intra-ocular lens replacement. Other: None. MRA HEAD FINDINGS Internal carotid arteries: Patent. Mild lumen irregularity without significant stenosis. Anterior cerebral arteries: Large left A1, no appreciable right A1, and large anterior communicating artery, normal variant. Middle cerebral arteries: Patent. Mild M1 segment lumen irregularity without significant stenosis. Anterior communicating artery: Patent. Posterior communicating arteries: Not identified, likely hypoplastic or absent. Posterior cerebral arteries:  Patent. Basilar artery:  Patent. Vertebral arteries:  Patent. No evidence of high-grade stenosis, large vessel occlusion, or aneurysm. IMPRESSION: MRI head: 1. Subcentimeter foci of cortical acute/early subacute infarction are present in left frontal lobe and right frontoparietal junction. Multiple vascular territories suggests embolic phenomenon. 2. Stable mild chronic microvascular ischemic changes and parenchymal volume loss of the brain. 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 MRA head: Patent circle of Willis. No large vessel occlusion, aneurysm, or significant stenosis is identified. These results will be called to the ordering clinician or representative by the Radiologist Assistant, and communication documented in the PACS or zVision Dashboard. Electronically Signed   By: Mitzi HansenLance   Furusawa-Stratton M.D.   On: 11/15/2016 06:23   [place] Carotid Bilateral (at [facility] And [facility] Only)  Result Date: 11/15/2016 CLINICAL DATA:  Cerebral infarction, syncope and hypertension. EXAM: BILATERAL CAROTID DUPLEX ULTRASOUND TECHNIQUE: Wallace CullensGray scale imaging, color Doppler and duplex ultrasound were performed of bilateral carotid and vertebral arteries in the neck. COMPARISON:  None. FINDINGS: Criteria: Quantification of carotid stenosis is based on velocity parameters that correlate the residual internal carotid diameter with NASCET-based stenosis levels, using the diameter of the distal internal carotid lumen as the denominator for stenosis measurement. The following velocity measurements were obtained: RIGHT ICA:  51/12 cm/sec CCA:  63/9 cm/sec SYSTOLIC ICA/CCA RATIO:  0.8 DIASTOLIC ICA/CCA RATIO:  1.3 ECA:  79 cm/sec LEFT ICA:  59/16 cm/sec CCA:  66/13 cm/sec  SYSTOLIC ICA/CCA RATIO:  0.9 DIASTOLIC ICA/CCA RATIO:  1.3 ECA:  66 cm/sec RIGHT CAROTID ARTERY: No significant plaque identified. Velocities and waveforms are normal. No evidence of right carotid stenosis in the neck. RIGHT VERTEBRAL ARTERY: Antegrade flow with normal waveform and velocity. LEFT CAROTID ARTERY: Mild amount of plaque at the level of the carotid bulb and proximal ICA. No significant stenosis identified with estimated less than 50% left ICA stenosis. LEFT VERTEBRAL ARTERY: Antegrade flow with normal waveform and velocity. Incidental small left thyroid nodules of 1 cm or less in diameter. These have a spongiform appearance by ultrasound and are likely benign. The entire thyroid gland was not imaged. IMPRESSION: 1. Mild amount of plaque at the level of the left carotid bulb and proximal ICA. Estimated left ICA stenosis is less than 50%. 2. No evidence of right carotid stenosis. 3. Incidental small left thyroid nodules which appear spongiform and benign. Electronically Signed   By: Irish Lack M.D.   On: 11/15/2016 10:00   Mr Maxine Glenn Head/brain  UE Cm  Result Date: 11/15/2016 CLINICAL DATA:  76 y/o  F; vertigo, episodic, peripheral. EXAM: MRI HEAD WITHOUT CONTRAST MRA HEAD WITHOUT CONTRAST TECHNIQUE: Multiplanar, multiecho pulse sequences of the brain and surrounding structures were obtained without intravenous contrast. Angiographic images of the head were obtained using MRA technique without contrast. COMPARISON:  09/11/2016 MRI and MRA of the head. 11/14/2016 CT of the head. FINDINGS: MRI HEAD FINDINGS Brain: For subcentimeter foci of reduced diffusion are present, 3 in the left frontal lobe and 1 in the right frontoparietal junction compatible with acute/ early subacute infarction (series 100, image 43 and 44). No associated hemorrhage or mass effect. 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 Stable background of mild chronic microvascular ischemic changes and parenchymal volume loss of the brain for age. No hydrocephalus, extra-axial collection, or effacement of basilar cisterns. Vascular: Normal flow voids. Skull and upper cervical spine: Normal marrow signal. Sinuses/Orbits: Partial opacification of mastoid air cells. No abnormal signal of paranasal sinuses. Bilateral intra-ocular lens replacement. Other: None. MRA HEAD FINDINGS Internal carotid arteries: Patent. Mild lumen irregularity without significant stenosis. Anterior cerebral arteries: Large left A1, no appreciable right A1, and large anterior communicating artery, normal variant. Middle cerebral arteries: Patent. Mild M1 segment lumen irregularity without significant stenosis. Anterior communicating artery: Patent. Posterior communicating arteries: Not identified, likely hypoplastic or absent. Posterior cerebral arteries:  Patent. Basilar artery:  Patent. Vertebral arteries:  Patent. No evidence of high-grade stenosis, large vessel occlusion, or aneurysm. IMPRESSION: MRI head: 1. Subcentimeter foci of cortical acute/early subacute infarction are present in left frontal lobe and  right frontoparietal junction. Multiple vascular territories suggests embolic phenomenon. 2. Stable mild chronic microvascular ischemic changes and parenchymal volume loss of the brain. 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 MRA head: Patent circle of Willis. No large vessel occlusion, aneurysm, or significant stenosis is identified. These results will be called to the ordering clinician or representative by the Radiologist Assistant, and communication documented in the PACS or zVision Dashboard. Electronically Signed   By: Mitzi Hansen M.D.   On: 11/15/2016 06:23   Ct Head Code Stroke Wo Contrast  Result Date: 11/14/2016 CLINICAL DATA:  Code stroke. Focal neuro deficit less than 6 hours, stroke suspected. EXAM: CT HEAD WITHOUT CONTRAST TECHNIQUE: Contiguous axial images were obtained from the base of the skull through the vertex without intravenous contrast. COMPARISON:  MRI head 09/11/2016 FINDINGS: Brain: Mild atrophy. Chronic microvascular ischemic change in the white matter. Negative  for acute infarct. Negative for hemorrhage or mass. Vascular: Negative for hyperdense vessel Skull: Negative Sinuses/Orbits: Negative Other: None ASPECTS ([place] Stroke Program Early CT Score) - Ganglionic level infarction (caudate, lentiform nuclei, internal capsule, insula, M1-M3 cortex): 7 - Supraganglionic infarction (M4-M6 cortex): 3 Total score (0-10 with 10 being normal): 10 IMPRESSION: 1. No acute abnormality. Atrophy with chronic microvascular ischemia. 2. ASPECTS is 10 These results were called by telephone at the time of interpretation on 11/14/2016 at 7:48 pm to Dr. Nita Sickle , who verbally acknowledged these results. Electronically Signed   By: Marlan Palau M.D.   On: 11/14/2016 19:49    Medications:  I have reviewed the patient's current medications. Scheduled: . citalopram  20 mg Oral Daily  . clonazePAM  0.5 mg Oral QHS  . clopidogrel  75 mg Oral Daily  .  gabapentin  100 mg Oral BID  . heparin  5,000 Units Subcutaneous Q8H  . pravastatin  10 mg Oral QPM  . traZODone  100 mg Oral QHS    Assessment/Plan: Patient without new neurological complaints.  Cardiology has evaluated.  Patient to remain on statin, ASA and Plavix.  Recommendations: 1.  Patient to follow up with cardiology and neurology on an outpatient basis.   No further neurologic intervention is recommended at this time.  If further questions arise, please call or page at that time.  Thank you for allowing neurology to participate in the care of this patient.    LOS: 0 days   Thana Farr, MD Neurology [phone] 11/16/2016  12:02 PM

## 2016-11-16 NOTE — Progress Notes (Signed)
[facility] Cardiology  SUBJECTIVE: Patient denies chest pain, shortness of breath, palpitations, dizziness, lightheadedness, presyncope, or syncope.    Vitals:   11/15/16 2009 11/15/16 2358 11/16/16 0412 11/16/16 0830  BP: (!) 127/40 (!) 140/48 107/84 (!) 92/43  Pulse: (!) 48 (!) 44 (!) 45   Resp: 14 14 15 16   Temp: 99.1 F (37.3 C) 99.2 F (37.3 C) 97.9 F (36.6 C) 98 F (36.7 C)  TempSrc: Oral Oral Oral Oral  SpO2: 97% 97% 98% 99%  Weight:      Height:         Intake/Output Summary (Last 24 hours) at 11/16/16 1042 Last data filed at 11/16/16 0900  Gross per 24 hour  Intake          1646.67 ml  Output                0 ml  Net          1646.67 ml      PHYSICAL EXAM  General: Well developed, well nourished, in no acute distress HEENT:  Normocephalic and atramatic Neck:   No JVD.  Lungs: Normal effort of breathing, no wheezing Heart: Bradycardic, regular rhythm. Normal S1 and S2 without gallops or murmurs.  Abdomen: Bowel sounds are positive Msk: Patient lying in bed.  Gait not assessed. Extremities: No clubbing, cyanosis or edema.   Neuro: Alert and oriented X 3.  No focal neurological deficits. Psych:  Good affect, responds appropriately   LABS: Basic Metabolic Panel:  Recent Labs  16/10/96 1916  NA 138  K 3.5  CL 105  CO2 26  GLUCOSE 121*  BUN 21*  CREATININE 1.31*  CALCIUM 9.1  MG 2.0   Liver Function Tests:  Recent Labs  11/14/16 1916  AST 24  ALT 9*  ALKPHOS 42  BILITOT 1.0  PROT 6.5  ALBUMIN 3.7   No results for input(s): LIPASE, AMYLASE in the last 72 hours. CBC:  Recent Labs  11/14/16 1916  WBC 5.4  NEUTROABS 2.9  HGB 13.9  HCT 41.2  MCV 92.6  PLT 171   Cardiac Enzymes:  Recent Labs  11/14/16 1916  TROPONINI <0.03   BNP: Invalid input(s): POCBNP D-Dimer: No results for input(s): DDIMER in the last 72 hours. Hemoglobin A1C:  Recent Labs  11/15/16 0652  HGBA1C 5.4   Fasting Lipid Panel:  Recent Labs   11/15/16 0652  CHOL 142  HDL 31*  LDLCALC 65  TRIG 045*  CHOLHDL 4.6   Thyroid Function Tests: No results for input(s): TSH, T4TOTAL, T3FREE, THYROIDAB in the last 72 hours.  Invalid input(s): FREET3 Anemia Panel: No results for input(s): VITAMINB12, FOLATE, FERRITIN, TIBC, IRON, RETICCTPCT in the last 72 hours.  Dg Chest 2 View  Result Date: 11/14/2016 CLINICAL DATA:  Acute onset of syncope.  Initial encounter. EXAM: CHEST  2 VIEW COMPARISON:  None. FINDINGS: The lungs are well-aerated. There is elevation of the right hemidiaphragm. There is no evidence of focal opacification, pleural effusion or pneumothorax. The deep sulcus on the right side is thought to reflect a normal variant. The heart is normal in size; the mediastinal contour is within normal limits. No acute osseous abnormalities are seen. Cervical spinal fusion hardware is noted. IMPRESSION: Elevation of the right hemidiaphragm. No acute cardiopulmonary process seen. Electronically Signed   By: Roanna Raider M.D.   On: 11/14/2016 22:59   Mr Brain Wo Contrast  Result Date: 11/15/2016 CLINICAL DATA:  76 y/o  F; vertigo, episodic, peripheral. EXAM:  MRI HEAD WITHOUT CONTRAST MRA HEAD WITHOUT CONTRAST TECHNIQUE: Multiplanar, multiecho pulse sequences of the brain and surrounding structures were obtained without intravenous contrast. Angiographic images of the head were obtained using MRA technique without contrast. COMPARISON:  09/11/2016 MRI and MRA of the head. 11/14/2016 CT of the head. FINDINGS: MRI HEAD FINDINGS Brain: For subcentimeter foci of reduced diffusion are present, 3 in the left frontal lobe and 1 in the right frontoparietal junction compatible with acute/ early subacute infarction (series 100, image 43 and 44). No associated hemorrhage or mass effect. 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 Stable background of mild chronic microvascular ischemic changes and parenchymal volume loss of the brain for age. No  hydrocephalus, extra-axial collection, or effacement of basilar cisterns. Vascular: Normal flow voids. Skull and upper cervical spine: Normal marrow signal. Sinuses/Orbits: Partial opacification of mastoid air cells. No abnormal signal of paranasal sinuses. Bilateral intra-ocular lens replacement. Other: None. MRA HEAD FINDINGS Internal carotid arteries: Patent. Mild lumen irregularity without significant stenosis. Anterior cerebral arteries: Large left A1, no appreciable right A1, and large anterior communicating artery, normal variant. Middle cerebral arteries: Patent. Mild M1 segment lumen irregularity without significant stenosis. Anterior communicating artery: Patent. Posterior communicating arteries: Not identified, likely hypoplastic or absent. Posterior cerebral arteries:  Patent. Basilar artery:  Patent. Vertebral arteries:  Patent. No evidence of high-grade stenosis, large vessel occlusion, or aneurysm. IMPRESSION: MRI head: 1. Subcentimeter foci of cortical acute/early subacute infarction are present in left frontal lobe and right frontoparietal junction. Multiple vascular territories suggests embolic phenomenon. 2. Stable mild chronic microvascular ischemic changes and parenchymal volume loss of the brain. 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 MRA head: Patent circle of Willis. No large vessel occlusion, aneurysm, or significant stenosis is identified. These results will be called to the ordering clinician or representative by the Radiologist Assistant, and communication documented in the PACS or zVision Dashboard. Electronically Signed   By: Mitzi Hansen M.D.   On: 11/15/2016 06:23   US Carotid Bilateral (at [facility] And [facility] Only)  Result Date: 11/15/2016 CLINICAL DATA:  Cerebral infarction, syncope and hypertension. EXAM: BILATERAL CAROTID DUPLEX ULTRASOUND TECHNIQUE: Wallace Cullens scale imaging, color Doppler and duplex ultrasound were performed of bilateral carotid and  vertebral arteries in the neck. COMPARISON:  None. FINDINGS: Criteria: Quantification of carotid stenosis is based on velocity parameters that correlate the residual internal carotid diameter with NASCET-based stenosis levels, using the diameter of the distal internal carotid lumen as the denominator for stenosis measurement. The following velocity measurements were obtained: RIGHT ICA:  51/12 cm/sec CCA:  63/9 cm/sec SYSTOLIC ICA/CCA RATIO:  0.8 DIASTOLIC ICA/CCA RATIO:  1.3 ECA:  79 cm/sec LEFT ICA:  59/16 cm/sec CCA:  66/13 cm/sec SYSTOLIC ICA/CCA RATIO:  0.9 DIASTOLIC ICA/CCA RATIO:  1.3 ECA:  66 cm/sec RIGHT CAROTID ARTERY: No significant plaque identified. Velocities and waveforms are normal. No evidence of right carotid stenosis in the neck. RIGHT VERTEBRAL ARTERY: Antegrade flow with normal waveform and velocity. LEFT CAROTID ARTERY: Mild amount of plaque at the level of the carotid bulb and proximal ICA. No significant stenosis identified with estimated less than 50% left ICA stenosis. LEFT VERTEBRAL ARTERY: Antegrade flow with normal waveform and velocity. Incidental small left thyroid nodules of 1 cm or less in diameter. These have a spongiform appearance by ultrasound and are likely benign. The entire thyroid gland was not imaged. IMPRESSION: 1. Mild amount of plaque at the level of the left carotid bulb and proximal ICA. Estimated left ICA stenosis  is less than 50%. 2. No evidence of right carotid stenosis. 3. Incidental small left thyroid nodules which appear spongiform and benign. Electronically Signed   By: Irish LackGlenn  Yamagata M.D.   On: 11/15/2016 10:00   Mr Maxine GlennMra Head/brain JYWo Cm  Result Date: 11/15/2016 CLINICAL DATA:  76 y/o  F; vertigo, episodic, peripheral. EXAM: MRI HEAD WITHOUT CONTRAST MRA HEAD WITHOUT CONTRAST TECHNIQUE: Multiplanar, multiecho pulse sequences of the brain and surrounding structures were obtained without intravenous contrast. Angiographic images of the head were obtained  using MRA technique without contrast. COMPARISON:  09/11/2016 MRI and MRA of the head. 11/14/2016 CT of the head. FINDINGS: MRI HEAD FINDINGS Brain: For subcentimeter foci of reduced diffusion are present, 3 in the left frontal lobe and 1 in the right frontoparietal junction compatible with acute/ early subacute infarction (series 100, image 43 and 44). No associated hemorrhage or mass effect. 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 Stable background of mild chronic microvascular ischemic changes and parenchymal volume loss of the brain for age. No hydrocephalus, extra-axial collection, or effacement of basilar cisterns. Vascular: Normal flow voids. Skull and upper cervical spine: Normal marrow signal. Sinuses/Orbits: Partial opacification of mastoid air cells. No abnormal signal of paranasal sinuses. Bilateral intra-ocular lens replacement. Other: None. MRA HEAD FINDINGS Internal carotid arteries: Patent. Mild lumen irregularity without significant stenosis. Anterior cerebral arteries: Large left A1, no appreciable right A1, and large anterior communicating artery, normal variant. Middle cerebral arteries: Patent. Mild M1 segment lumen irregularity without significant stenosis. Anterior communicating artery: Patent. Posterior communicating arteries: Not identified, likely hypoplastic or absent. Posterior cerebral arteries:  Patent. Basilar artery:  Patent. Vertebral arteries:  Patent. No evidence of high-grade stenosis, large vessel occlusion, or aneurysm. IMPRESSION: MRI head: 1. Subcentimeter foci of cortical acute/early subacute infarction are present in left frontal lobe and right frontoparietal junction. Multiple vascular territories suggests embolic phenomenon. 2. Stable mild chronic microvascular ischemic changes and parenchymal volume loss of the brain. 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 MRA head: Patent circle of Willis. No large vessel occlusion, aneurysm, or significant  stenosis is identified. These results will be called to the ordering clinician or representative by the Radiologist Assistant, and communication documented in the PACS or zVision Dashboard. Electronically Signed   By: Mitzi HansenLance  Furusawa-Stratton M.D.   On: 11/15/2016 06:23   Ct Head Code Stroke Wo Contrast  Result Date: 11/14/2016 CLINICAL DATA:  Code stroke. Focal neuro deficit less than 6 hours, stroke suspected. EXAM: CT HEAD WITHOUT CONTRAST TECHNIQUE: Contiguous axial images were obtained from the base of the skull through the vertex without intravenous contrast. COMPARISON:  MRI head 09/11/2016 FINDINGS: Brain: Mild atrophy. Chronic microvascular ischemic change in the white matter. Negative for acute infarct. Negative for hemorrhage or mass. Vascular: Negative for hyperdense vessel Skull: Negative Sinuses/Orbits: Negative Other: None ASPECTS ([place]berta Stroke Program Early CT Score) - Ganglionic level infarction (caudate, lentiform nuclei, internal capsule, insula, M1-M3 cortex): 7 - Supraganglionic infarction (M4-M6 cortex): 3 Total score (0-10 with 10 being normal): 10 IMPRESSION: 1. No acute abnormality. Atrophy with chronic microvascular ischemia. 2. ASPECTS is 10 These results were called by telephone at the time of interpretation on 11/14/2016 at 7:48 pm to Dr. Nita SickleAROLINA VERONESE , who verbally acknowledged these results. Electronically Signed   By: Marlan Palauharles  Clark M.D.   On: 11/14/2016 19:49     Echo Normal LV function with LVEF 55-65% with mild to moderate mitral regurgitation.  TELEMETRY: Sinus bradycardia, 41 bpm  ASSESSMENT AND PLAN:  Principal Problem:   Fall at home, initial encounter Active Problems:   TIA (transient ischemic attack)    1.  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  MRI revealed multiple bilateral hemispheric, small, acute infarcts, suspicious for embolic etiology.  Carotid ultrasound without hemodynamically significant stenosis.  Echocardiogram  without evidence of cardiac source of emboli, and normal left ventricular function. 2.  Sinus bradycardia, with rate as low as 27 bpm, and up to 53 bpm with ambulation.  The patient appears minimally symptomatic, not complaining of shortness of breath, dizziness, lightheadedness, or presyncope.  Upon review of past office visits, the patient has had bradycardia with rates in the mid to upper 50s, but no evidence of bradycardia to this degree. 3. Hypotension, appears asymptomatic.   Recommendations: 1.  Agree with overall therapy 2.  Continue aspirin, Plavix, and pravastatin 3.  Defer permanent pacemaker implantation in the setting of acute CVA, as could be reversible cause.  4.  Recommend follow-up as outpatient with Dr. Darrold Junker later this week or early next week for Holter monitor, and to further discuss Linq implantation.   Leanora Ivanoff, PA-C 11/16/2016 10:42 AM

## 2016-11-17 LAB — CBC
HCT: 37.7 % (ref 35.0–47.0)
Hemoglobin: 12.7 g/dL (ref 12.0–16.0)
MCH: 30.8 pg (ref 26.0–34.0)
MCHC: 33.6 g/dL (ref 32.0–36.0)
MCV: 91.7 fL (ref 80.0–100.0)
Platelets: 145 K/uL — ABNORMAL LOW (ref 150–440)
RBC: 4.12 MIL/uL (ref 3.80–5.20)
RDW: 14.3 % (ref 11.5–14.5)
WBC: 6.2 K/uL (ref 3.6–11.0)

## 2016-11-17 MED ORDER — PRAVASTATIN SODIUM 40 MG PO TABS: 40.0000 mg | ORAL_TABLET | Freq: Every evening | ORAL | 0 refills | Status: AC

## 2016-11-17 MED ORDER — ASPIRIN 81 MG PO TBEC: 81.0000 mg | DELAYED_RELEASE_TABLET | Freq: Every day | ORAL | Status: AC

## 2016-11-17 NOTE — Progress Notes (Signed)
Discussed discharge instructions and medications with patient and her daughter. IV removed. All questions addressed. Medications sent to wrong pharmacy, called placed to have prescription transferred to correct pharmacy, and correct pharmacy updated in the system. Patient transported home via car by her daughter.  Orson Apeanielle Ibeth Fahmy, RN

## 2016-11-17 NOTE — Discharge Summary (Signed)
[facility] [place] at [facility], 76 y.o., DOB [birth year], MRN [id number]. Admission date: 11/14/2016 Discharge Date 11/17/2016 Primary MD No primary care provider on file. Admitting Physician Altamese Dilling, MD  Admission Diagnosis  TIA (transient ischemic attack) [G45.9] Bradycardia [R00.1] Syncope [R55] Expressive aphasia [R47.01] Acute renal failure superimposed on chronic kidney disease, unspecified CKD stage, unspecified acute renal failure type (HCC) [N17.9, N18.9]  Discharge Diagnosis   Principal Problem: Acute CVA Severe bradycardia- seen by cardiology they state that patient will need a Holter monitor no pacemaker for now Dizziness and fall GERD Depression      Hospital Course  Maria Horton  is a 76 y.o. female with a known history of Htn, Stroke, Vascular dementia- Was fine until yesterday. Today felt very Imbalanced and was"bumpinf it to walls" while walking at home and had a fall due to loss of balance. Patient was admitted for further evaluation.  She underwent MRI of the brain which showed acute CVA.she was seen by neurology who recommended medical management. Was seen by PT who recommended home PT  Severe bradycardia with heart rate dropping into the 27. Pt was seen by Dr. Janet Berlin. Who felt that this could be related to her stroke and did not recommend a pacemaker. I confirmed again with him today prior to patient being discharged that she did not need any type of intervention and he stated that she can follow up outpatient for a Holter monitor.         Consults  cardiology,neurology  Significant Tests:  See full reports for all details     Dg Chest 2 View  Result Date: 11/14/2016 CLINICAL DATA:  Acute onset of syncope.  Initial encounter. EXAM: CHEST  2 VIEW COMPARISON:  None. FINDINGS: The lungs are well-aerated. There is elevation of the right hemidiaphragm. There is no evidence of focal opacification,  pleural effusion or pneumothorax. The deep sulcus on the right side is thought to reflect a normal variant. The heart is normal in size; the mediastinal contour is within normal limits. No acute osseous abnormalities are seen. Cervical spinal fusion hardware is noted. IMPRESSION: Elevation of the right hemidiaphragm. No acute cardiopulmonary process seen. Electronically Signed   By: Roanna Raider M.D.   On: 11/14/2016 22:59   Mr Brain Wo Contrast  Result Date: 11/15/2016 CLINICAL DATA:  76 y/o  F; vertigo, episodic, peripheral. EXAM: MRI HEAD WITHOUT CONTRAST MRA HEAD WITHOUT CONTRAST TECHNIQUE: Multiplanar, multiecho pulse sequences of the brain and surrounding structures were obtained without intravenous contrast. Angiographic images of the head were obtained using MRA technique without contrast. COMPARISON:  09/11/2016 MRI and MRA of the head. 11/14/2016 CT of the head. FINDINGS: MRI HEAD FINDINGS Brain: For subcentimeter foci of reduced diffusion are present, 3 in the left frontal lobe and 1 in the right frontoparietal junction compatible with acute/ early subacute infarction (series 100, image 43 and 44). No associated hemorrhage or mass effect. 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 Stable background of mild chronic microvascular ischemic changes and parenchymal volume loss of the brain for age. No hydrocephalus, extra-axial collection, or effacement of basilar cisterns. Vascular: Normal flow voids. Skull and upper cervical spine: Normal marrow signal. Sinuses/Orbits: Partial opacification of mastoid air cells. No abnormal signal of paranasal sinuses. Bilateral intra-ocular lens replacement. Other: None. MRA HEAD FINDINGS Internal carotid arteries: Patent. Mild lumen irregularity without significant stenosis. Anterior cerebral arteries: Large left A1, no appreciable right A1, and large anterior communicating artery, normal  variant. Middle cerebral arteries: Patent. Mild M1 segment lumen  irregularity without significant stenosis. Anterior communicating artery: Patent. Posterior communicating arteries: Not identified, likely hypoplastic or absent. Posterior cerebral arteries:  Patent. Basilar artery:  Patent. Vertebral arteries:  Patent. No evidence of high-grade stenosis, large vessel occlusion, or aneurysm. IMPRESSION: MRI head: 1. Subcentimeter foci of cortical acute/early subacute infarction are present in left frontal lobe and right frontoparietal junction. Multiple vascular territories suggests embolic phenomenon. 2. Stable mild chronic microvascular ischemic changes and parenchymal volume loss of the brain. 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 MRA head: Patent circle of Willis. No large vessel occlusion, aneurysm, or significant stenosis is identified. These results will be called to the ordering clinician or representative by the Radiologist Assistant, and communication documented in the PACS or zVision Dashboard. Electronically Signed   By: Mitzi HansenLance  Furusawa-Stratton M.D.   On: 11/15/2016 06:23   [place] Carotid Bilateral (at [facility] And [facility] Only)  Result Date: 11/15/2016 CLINICAL DATA:  Cerebral infarction, syncope and hypertension. EXAM: BILATERAL CAROTID DUPLEX ULTRASOUND TECHNIQUE: Wallace CullensGray scale imaging, color Doppler and duplex ultrasound were performed of bilateral carotid and vertebral arteries in the neck. COMPARISON:  None. FINDINGS: Criteria: Quantification of carotid stenosis is based on velocity parameters that correlate the residual internal carotid diameter with NASCET-based stenosis levels, using the diameter of the distal internal carotid lumen as the denominator for stenosis measurement. The following velocity measurements were obtained: RIGHT ICA:  51/12 cm/sec CCA:  63/9 cm/sec SYSTOLIC ICA/CCA RATIO:  0.8 DIASTOLIC ICA/CCA RATIO:  1.3 ECA:  79 cm/sec LEFT ICA:  59/16 cm/sec CCA:  66/13 cm/sec SYSTOLIC ICA/CCA RATIO:  0.9 DIASTOLIC ICA/CCA RATIO:  1.3 ECA:  66  cm/sec RIGHT CAROTID ARTERY: No significant plaque identified. Velocities and waveforms are normal. No evidence of right carotid stenosis in the neck. RIGHT VERTEBRAL ARTERY: Antegrade flow with normal waveform and velocity. LEFT CAROTID ARTERY: Mild amount of plaque at the level of the carotid bulb and proximal ICA. No significant stenosis identified with estimated less than 50% left ICA stenosis. LEFT VERTEBRAL ARTERY: Antegrade flow with normal waveform and velocity. Incidental small left thyroid nodules of 1 cm or less in diameter. These have a spongiform appearance by ultrasound and are likely benign. The entire thyroid gland was not imaged. IMPRESSION: 1. Mild amount of plaque at the level of the left carotid bulb and proximal ICA. Estimated left ICA stenosis is less than 50%. 2. No evidence of right carotid stenosis. 3. Incidental small left thyroid nodules which appear spongiform and benign. Electronically Signed   By: Irish LackGlenn  Yamagata M.D.   On: 11/15/2016 10:00   Mr Maxine GlennMra Head/brain JXWo Cm  Result Date: 11/15/2016 CLINICAL DATA:  76 y/o  F; vertigo, episodic, peripheral. EXAM: MRI HEAD WITHOUT CONTRAST MRA HEAD WITHOUT CONTRAST TECHNIQUE: Multiplanar, multiecho pulse sequences of the brain and surrounding structures were obtained without intravenous contrast. Angiographic images of the head were obtained using MRA technique without contrast. COMPARISON:  09/11/2016 MRI and MRA of the head. 11/14/2016 CT of the head. FINDINGS: MRI HEAD FINDINGS Brain: For subcentimeter foci of reduced diffusion are present, 3 in the left frontal lobe and 1 in the right frontoparietal junction compatible with acute/ early subacute infarction (series 100, image 43 and 44). No associated hemorrhage or mass effect. 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 Stable background of mild chronic microvascular ischemic changes and parenchymal volume loss of the brain for age. No hydrocephalus, extra-axial collection, or  effacement of basilar cisterns. Vascular:  Normal flow voids. Skull and upper cervical spine: Normal marrow signal. Sinuses/Orbits: Partial opacification of mastoid air cells. No abnormal signal of paranasal sinuses. Bilateral intra-ocular lens replacement. Other: None. MRA HEAD FINDINGS Internal carotid arteries: Patent. Mild lumen irregularity without significant stenosis. Anterior cerebral arteries: Large left A1, no appreciable right A1, and large anterior communicating artery, normal variant. Middle cerebral arteries: Patent. Mild M1 segment lumen irregularity without significant stenosis. Anterior communicating artery: Patent. Posterior communicating arteries: Not identified, likely hypoplastic or absent. Posterior cerebral arteries:  Patent. Basilar artery:  Patent. Vertebral arteries:  Patent. No evidence of high-grade stenosis, large vessel occlusion, or aneurysm. IMPRESSION: MRI head: 1. Subcentimeter foci of cortical acute/early subacute infarction are present in left frontal lobe and right frontoparietal junction. Multiple vascular territories suggests embolic phenomenon. 2. Stable mild chronic microvascular ischemic changes and parenchymal volume loss of the brain. 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 MRA head: Patent circle of Willis. No large vessel occlusion, aneurysm, or significant stenosis is identified. These results will be called to the ordering clinician or representative by the Radiologist Assistant, and communication documented in the PACS or zVision Dashboard. Electronically Signed   By: Mitzi Hansen M.D.   On: 11/15/2016 06:23   Ct Head Code Stroke Wo Contrast  Result Date: 11/14/2016 CLINICAL DATA:  Code stroke. Focal neuro deficit less than 6 hours, stroke suspected. EXAM: CT HEAD WITHOUT CONTRAST TECHNIQUE: Contiguous axial images were obtained from the base of the skull through the vertex without intravenous contrast. COMPARISON:  MRI head  09/11/2016 FINDINGS: Brain: Mild atrophy. Chronic microvascular ischemic change in the white matter. Negative for acute infarct. Negative for hemorrhage or mass. Vascular: Negative for hyperdense vessel Skull: Negative Sinuses/Orbits: Negative Other: None ASPECTS (Alberta Stroke Program Early CT Score) - Ganglionic level infarction (caudate, lentiform nuclei, internal capsule, insula, M1-M3 cortex): 7 - Supraganglionic infarction (M4-M6 cortex): 3 Total score (0-10 with 10 being normal): 10 IMPRESSION: 1. No acute abnormality. Atrophy with chronic microvascular ischemia. 2. ASPECTS is 10 These results were called by telephone at the time of interpretation on 11/14/2016 at 7:48 pm to Dr. Nita Sickle , who verbally acknowledged these results. Electronically Signed   By: Marlan Palau M.D.   On: 11/14/2016 19:49       Today   Subjective:   [facility]  Feels well denies any dizziness or weakness  Objective:   Blood pressure 114/70, pulse (!) 43, temperature 97.9 F (36.6 C), temperature source Oral, resp. rate 14, height 5\' 5"  (1.651 m), weight 140 lb (63.5 kg), SpO2 98 %.  .  Intake/Output Summary (Last 24 hours) at 11/17/16 1521 Last data filed at 11/17/16 1020  Gross per 24 hour  Intake          1018.33 ml  Output                0 ml  Net          1018.33 ml    Exam VITAL SIGNS: Blood pressure 114/70, pulse (!) 43, temperature 97.9 F (36.6 C), temperature source Oral, resp. rate 14, height 5\' 5"  (1.651 m), weight 140 lb (63.5 kg), SpO2 98 %.  GENERAL:  76 y.o.-year-old patient lying in the bed with no acute distress.  EYES: Pupils equal, round, reactive to light and accommodation. No scleral icterus. Extraocular muscles intact.  HEENT: Head atraumatic, normocephalic. Oropharynx and nasopharynx clear.  NECK:  Supple, no jugular venous distention. No thyroid enlargement, no tenderness.  LUNGS: Normal breath  sounds bilaterally, no wheezing, rales,rhonchi or crepitation. No use  of accessory muscles of respiration.  CARDIOVASCULAR: S1, S2 normal. No murmurs, rubs, or gallops.  ABDOMEN: Soft, nontender, nondistended. Bowel sounds present. No organomegaly or mass.  EXTREMITIES: No pedal edema, cyanosis, or clubbing.  NEUROLOGIC: Cranial nerves II through XII are intact. Muscle strength 5/5 in all extremities. Sensation intact. Gait not checked.  PSYCHIATRIC: The patient is alert and oriented x 3.  SKIN: No obvious rash, lesion, or ulcer.   Data Review     CBC w Diff: Lab Results  Component Value Date   WBC 6.2 11/17/2016   HGB 12.7 11/17/2016   HCT 37.7 11/17/2016   PLT 145 (L) 11/17/2016   LYMPHOPCT 34 11/14/2016   MONOPCT 12 11/14/2016   EOSPCT 1 11/14/2016   BASOPCT 1 11/14/2016   CMP: Lab Results  Component Value Date   NA 138 11/14/2016   NA 145 (H) 10/15/2016   K 3.5 11/14/2016   CL 105 11/14/2016   CO2 26 11/14/2016   BUN 21 (H) 11/14/2016   BUN 15 10/15/2016   CREATININE 1.31 (H) 11/14/2016   PROT 6.5 11/14/2016   ALBUMIN 3.7 11/14/2016   BILITOT 1.0 11/14/2016   ALKPHOS 42 11/14/2016   AST 24 11/14/2016   ALT 9 (L) 11/14/2016  .  Micro Results No results found for this or any previous visit (from the past 240 hour(s)).      Code Status Orders        Start     Ordered   11/14/16 2146  Full code  Continuous     11/14/16 2145    Code Status History    Date Active Date Inactive Code Status Order ID Comments User Context   09/10/2016  4:17 PM 09/11/2016  6:50 PM Full Code [id number]  Auburn Bilberry, MD Inpatient   09/26/2015  8:50 PM 10/01/2015  6:57 PM Full Code [id number]  Clapacs, Jackquline Denmark, MD Inpatient          Follow-up Information    Paraschos, Alexander, MD Follow up in 2 day(s).   Specialty:  Cardiology Why:  severe bradycardia,need holter monitor Contact information: [address] [facility] [place] [address] [phone]        pcp Follow up in 1 week(s).           Discharge  Medications   Allergies as of 11/17/2016      Reactions   Ace Inhibitors Other (See Comments)   achy   Beta Adrenergic Blockers Other (See Comments)   Makes achy/feel sick   Calcium Channel Blockers Other (See Comments)   Feels achy like sick   Tape Rash      Medication List    STOP taking these medications   triamterene-hydrochlorothiazide 37.5-25 MG capsule Commonly known as:  DYAZIDE     TAKE these medications   albuterol 108 (90 Base) MCG/ACT inhaler Commonly known as:  PROVENTIL HFA;VENTOLIN HFA Inhale 2 puffs into the lungs every 6 (six) hours as needed for wheezing or shortness of breath.   aspirin 81 MG EC tablet Take 1 tablet (81 mg total) by mouth daily.   citalopram 20 MG tablet Commonly known as:  CELEXA Take 20 mg by mouth daily.   clonazePAM 0.5 MG tablet Commonly known as:  KLONOPIN Take 1.5 tablets (0.75 mg total) by mouth at bedtime.   clopidogrel 75 MG tablet Commonly known as:  PLAVIX Take 1 tablet (75 mg total) by mouth daily.  fluticasone 50 MCG/ACT nasal spray Commonly known as:  FLONASE Place 2 sprays into both nostrils daily as needed for allergies or rhinitis.   gabapentin 100 MG capsule Commonly known as:  NEURONTIN Take 100 mg by mouth 2 (two) times daily.   HYDROcodone-acetaminophen 5-325 MG tablet Commonly known as:  NORCO/VICODIN Take 1-2 tablets by mouth every 8 (eight) hours as needed for moderate pain.   hydrOXYzine 25 MG capsule Commonly known as:  VISTARIL Take 25-50 mg by mouth at bedtime as needed.   meloxicam 15 MG tablet Commonly known as:  MOBIC Take 15 mg by mouth daily.   ondansetron 4 MG tablet Commonly known as:  ZOFRAN Take 4 mg by mouth every 8 (eight) hours as needed for nausea or vomiting.   pravastatin 40 MG tablet Commonly known as:  PRAVACHOL Take 1 tablet (40 mg total) by mouth every evening. What changed:  medication strength  how much to take  when to take this   rOPINIRole 0.5 MG  tablet Commonly known as:  REQUIP Take 0.5-1 mg by mouth at bedtime.   traZODone 100 MG tablet Commonly known as:  DESYREL Take 200 mg by mouth at bedtime.          Total Time in preparing paper work, data evaluation and todays exam - 35 minutes  Auburn Bilberry M.D on 11/17/2016 at 3:21 PM  [facility]   Office  [phone]

## 2016-11-17 NOTE — Progress Notes (Signed)
Physical Therapy Treatment Patient Details Name: Maria CoronaMary L Dowell MRN: [id number] DOB: May 31, 1940 Today's Date: 11/17/2016    History of Present Illness Patient is a 76 y.o. female admitted on 21 AUG to ED s/p mechanical fall vs syncope.  Pt demonstrating expressive aphasia and gait/balance impairments. Recent admission (09/11/16) for difficulty speaking/TIA. PMHx includes anxiety, chronic LBP, depression, fibromyalgia, diverticulosis, gastroparesis, HTN, and vascular dementia.    PT Comments    Pt's ambulation improved today but still requiring SPC for balance.  Pt scored 17/24 on DGI balance assessment (<19/24 predictive of falls) but improved balance noted with SPC use.  Pt's HR initially 48 bpm beginning of session at rest, increased to 56 bpm with activity initially, then decreased to 44 bpm with activity, and then increased up to 67 bpm with activity towards end of session.  Pt overall tolerated session well.  Will continue to focus on strengthening and balance activities during hospital stay.   Follow Up Recommendations  Home health PT     Equipment Recommendations  [facility]    Recommendations for Other Services       Precautions / Restrictions Precautions Precautions: Fall Precaution Comments: Monitor HR Restrictions Weight Bearing Restrictions: No    Mobility  Bed Mobility Overal bed mobility: Modified Independent             General bed mobility comments: Supine to/from sit with HOB elevated; no difficulties noted  Transfers Overall transfer level: Independent Equipment used: None Transfers: Sit to/from RaytheonStand;Stand Pivot Transfers Sit to Stand: Independent Stand pivot transfers: Independent       General transfer comment: steady safe transfers noted  Ambulation/Gait Ambulation/Gait assistance: Min guard;Supervision Ambulation Distance (Feet): 250 Feet Assistive device: Straight cane   Gait velocity: decreased   General Gait  Details: initially mildly unsteady with SPC (pt holding onto walls periodically for stability) but improved balance noted with distance using SPC   Stairs Stairs: Yes   Stair Management: Step to pattern;Forwards;With cane Number of Stairs: 3 General stair comments: steady with use of SPC; increased time to perform (appeared d/t pt being cautious especially with descent)  Wheelchair Mobility    Modified Rankin (Stroke Patients Only)       Balance Overall balance assessment: History of Falls;Needs assistance Sitting-balance support: No upper extremity supported;Feet supported Sitting balance-Leahy Scale: Normal Sitting balance - Comments: sitting reaching outside BOS   Standing balance support: No upper extremity supported Standing balance-Leahy Scale: Good Standing balance comment: standing performing toileting hygiene, washing hands and brushing teeth at sink no loss of balance                 Standardized Balance Assessment Standardized Balance Assessment : Dynamic Gait Index   Dynamic Gait Index Level Surface: Mild Impairment Change in Gait Speed: Mild Impairment Gait with Horizontal Head Turns: Mild Impairment Gait with Vertical Head Turns: Mild Impairment Gait and Pivot Turn: Normal (use of SPC) Step Over Obstacle: Normal (use of SPC) Step Around Obstacles: Mild Impairment Steps: Moderate Impairment Total Score: 17  Pt used SPC during DGI.    Cognition Arousal/Alertness: Awake/alert Behavior During Therapy: WFL for tasks assessed/performed Overall Cognitive Status: Within Functional Limits for tasks assessed                                        Exercises General Exercises - Lower Extremity Hip ABduction/ADduction: AROM;Strengthening;Both;10 reps;Standing (UE  support for balance) Hip Flexion/Marching: AROM;Strengthening;Both;10 reps;Standing (UE support for balance) Other Exercises Other Exercises: Standing hamstring curls B LE's (one  LE at a time) x10 reps with B UE support for balance Other Exercises: B standing heel raises x10 reps with B UE support for balance    General Comments General comments (skin integrity, edema, etc.): Pt resting in bed upon PT arrival.  Nursing cleared pt for participation in physical therapy.  Pt agreeable to PT session.      Pertinent Vitals/Pain Pain Assessment: No/denies pain    Home Living                      Prior Function            PT Goals (current goals can now be found in the care plan section) Acute Rehab PT Goals Patient Stated Goal: go home PT Goal Formulation: With patient Time For Goal Achievement: 11/29/16 Potential to Achieve Goals: Good Progress towards PT goals: Progressing toward goals    Frequency    7X/week      PT Plan Current plan remains appropriate    Co-evaluation              AM-PAC PT "6 Clicks" Daily Activity  Outcome Measure  Difficulty turning over in bed (including adjusting bedclothes, sheets and blankets)?: None Difficulty moving from lying on back to sitting on the side of the bed? : None Difficulty sitting down on and standing up from a chair with arms (e.g., wheelchair, bedside commode, etc,.)?: None Help needed moving to and from a bed to chair (including a wheelchair)?: None Help needed walking in hospital room?: A Little Help needed climbing 3-5 steps with a railing? : A Little 6 Click Score: 22    End of Session Equipment Utilized During Treatment: Gait belt Activity Tolerance: Patient tolerated treatment well Patient left: in bed;with call bell/phone within reach;with bed alarm set Nurse Communication: Mobility status;Precautions PT Visit Diagnosis: Unsteadiness on feet (R26.81);Difficulty in walking, not elsewhere classified (R26.2);Muscle weakness (generalized) (M62.81);History of falling (Z91.81)     Time: 1610-9604 PT Time Calculation (min) (ACUTE ONLY): 23 min  Charges:  $Gait Training: 8-22  mins $Therapeutic Exercise: 8-22 mins                    G CodesHendricks Limes, PT 11/17/16, 10:29 AM [phone]

## 2016-11-17 NOTE — Discharge Instructions (Signed)
[facility] [place] at [facility] ° °DIET:  °Cardiac diet ° °DISCHARGE CONDITION:  °Stable ° °ACTIVITY:  °Activity as tolerated ° °OXYGEN:  °Home Oxygen: No. °  °Oxygen Delivery: room air ° °DISCHARGE LOCATION:  °home  ° ° °ADDITIONAL DISCHARGE INSTRUCTION: ° ° °If you experience worsening of your admission symptoms, develop shortness of breath, life threatening emergency, suicidal or homicidal thoughts you must seek medical attention immediately by calling 911 or calling your MD immediately  if symptoms less severe. ° °You Must read complete instructions/literature along with all the possible adverse reactions/side effects for all the Medicines you take and that have been prescribed to you. Take any new Medicines after you have completely understood and accpet all the possible adverse reactions/side effects.  ° °Please note ° °You were cared for by a hospitalist during your hospital stay. If you have any questions about your discharge medications or the care you received while you were in the hospital after you are discharged, you can call the unit and asked to speak with the hospitalist on call if the hospitalist that took care of you is not available. Once you are discharged, your primary care physician will handle any further medical issues. Please note that NO REFILLS for any discharge medications will be authorized once you are discharged, as it is imperative that you return to your primary care physician (or establish a relationship with a primary care physician if you do not have one) for your aftercare needs so that they can reassess your need for medications and monitor your lab values. ° ° °

## 2016-11-17 NOTE — Care Management (Signed)
Patient to discharge home today with home health PT and aide.  Barbara CowerJason from [facility] notified of discharge.  RNCM signing off.

## 2016-11-22 NOTE — Progress Notes (Signed)
[facility]  Patient Status: [facility]. Stated she was feeling much better and walker around her neighborhood over the weekend with no issues. MD notified, also notified Terrilee CroakBrenda Holland, CM & Bevelyn NgoStephanie Bowen, CM.    Scherrie GerlachJason E Hinton 11/22/2016, 10:29 AM

## 2016-12-07 ENCOUNTER — Inpatient Hospital Stay: Admission: RE | Admit: 2016-12-07 | Payer: Self-pay | Source: Ambulatory Visit

## 2016-12-09 ENCOUNTER — Other Ambulatory Visit: Payer: Self-pay

## 2016-12-09 ENCOUNTER — Encounter
Admission: RE | Admit: 2016-12-09 | Discharge: 2016-12-09 | Disposition: A | Discharge status: Home / Self Care | Payer: Medicare Other | Source: Ambulatory Visit | Attending: Cardiology | Admitting: Cardiology

## 2016-12-09 ENCOUNTER — Ambulatory Visit
Admission: RE | Admit: 2016-12-09 | Discharge: 2016-12-09 | Disposition: A | Discharge status: Home / Self Care | Payer: Medicare Other | Source: Ambulatory Visit | Attending: Cardiology | Admitting: Cardiology

## 2016-12-09 DIAGNOSIS — Z01818 Encounter for other preprocedural examination: Secondary | ICD-10-CM

## 2016-12-09 DIAGNOSIS — I495 Sick sinus syndrome: Secondary | ICD-10-CM | POA: Diagnosis not present

## 2016-12-09 DIAGNOSIS — Z01812 Encounter for preprocedural laboratory examination: Secondary | ICD-10-CM | POA: Insufficient documentation

## 2016-12-09 DIAGNOSIS — Z888 Allergy status to other drugs, medicaments and biological substances status: Secondary | ICD-10-CM | POA: Insufficient documentation

## 2016-12-09 DIAGNOSIS — R9431 Abnormal electrocardiogram [ECG] [EKG]: Secondary | ICD-10-CM | POA: Diagnosis not present

## 2016-12-09 HISTORY — DX: Gastro-esophageal reflux disease without esophagitis: K21.9

## 2016-12-09 HISTORY — DX: Nausea with vomiting, unspecified: R11.2

## 2016-12-09 HISTORY — DX: Other specified postprocedural states: Z98.890

## 2016-12-09 LAB — BASIC METABOLIC PANEL
Anion gap: 11 (ref 5–15)
BUN: 25 mg/dL — AB (ref 6–20)
CHLORIDE: 98 mmol/L — AB (ref 101–111)
CO2: 30 mmol/L (ref 22–32)
CREATININE: 1.33 mg/dL — AB (ref 0.44–1.00)
Calcium: 9.7 mg/dL (ref 8.9–10.3)
GFR calc Af Amer: 44 mL/min — ABNORMAL LOW (ref >60)
GFR calc non Af Amer: 38 mL/min — ABNORMAL LOW (ref >60)
GLUCOSE: 87 mg/dL (ref 65–99)
POTASSIUM: 3.5 mmol/L (ref 3.5–5.1)
SODIUM: 139 mmol/L (ref 135–145)

## 2016-12-09 LAB — CBC
HEMATOCRIT: 42.6 % (ref 35.0–47.0)
Hemoglobin: 13.7 g/dL (ref 12.0–16.0)
MCH: 30.2 pg (ref 26.0–34.0)
MCHC: 32.1 g/dL (ref 32.0–36.0)
MCV: 94 fL (ref 80.0–100.0)
PLATELETS: 181 10*3/uL (ref 150–440)
RBC: 4.54 MIL/uL (ref 3.80–5.20)
RDW: 15.1 % — ABNORMAL HIGH (ref 11.5–14.5)
WBC: 5.8 10*3/uL (ref 3.6–11.0)

## 2016-12-09 LAB — DIFFERENTIAL
Basophils Absolute: 0 10*3/uL (ref 0–0.1)
Basophils Relative: 0 %
EOS PCT: 0 %
Eosinophils Absolute: 0 10*3/uL (ref 0–0.7)
LYMPHS ABS: 1.5 10*3/uL (ref 1.0–3.6)
LYMPHS PCT: 26 %
MONO ABS: 0.6 10*3/uL (ref 0.2–0.9)
MONOS PCT: 10 %
NEUTROS ABS: 3.7 10*3/uL (ref 1.4–6.5)
Neutrophils Relative %: 64 %

## 2016-12-09 LAB — APTT: aPTT: 29 seconds (ref 24–36)

## 2016-12-09 LAB — SURGICAL PCR SCREEN
MRSA, PCR: NEGATIVE
STAPHYLOCOCCUS AUREUS: NEGATIVE

## 2016-12-09 LAB — PROTIME-INR
INR: 0.93
PROTHROMBIN TIME: 12.4 s (ref 11.4–15.2)

## 2016-12-09 NOTE — Patient Instructions (Signed)
Your procedure is scheduled on: Tuesday 12/14/16 Report to DAY SURGERY. 2ND FLOOR MEDICAL MALL ENTRANCE. To find out your arrival time please call [phone] between 1PM - 3PM on Monday 12/13/16.  Remember: Instructions that are not followed completely may result in serious medical risk, up to and including death, or upon the discretion of your surgeon and anesthesiologist your surgery may need to be rescheduled.    __X__ 1. Do not eat anything after midnight the night before your    procedure.  No gum chewing or hard candies.  You may drink clear   liquids up to 2 hours before you are scheduled to arrive at the   hospital for your procedure. Do not drink clear liquids within 2   hours of scheduled arrival to the hospital as this may lead to your   procedure being delayed or rescheduled.       Clear liquids include:   Water or Apple juice without pulp   Clear carbohydrate beverage such as Clearfast or Gatorade   Black coffee or Clear Tea (no milk, no creamer, do not add anything   to the coffee or tea)    Diabetics should only drink water   __X__ 2. No Alcohol for 24 hours before or after surgery.   ____ 3. Bring all medications with you on the day of surgery if instructed.    __X__ 4. Notify your doctor if there is any change in your medical condition     (cold, fever, infections).             __X___5. No smoking within 24 hours of your surgery.     Do not wear jewelry, make-up, hairpins, clips or nail polish.  Do not wear lotions, powders, or perfumes.   Do not shave 48 hours prior to surgery. Men may shave face and neck.  Do not bring valuables to the hospital.    [facility] any belongings or valuables.               Contacts, dentures or bridgework may not be worn into surgery.  Leave your suitcase in the car. After surgery it may be brought to your room.  For patients admitted to the hospital, discharge time is determined by your                 treatment team.   Patients discharged the day of surgery will not be allowed to drive home.   Please read over the following fact sheets that you were given:   MRSA Information   __X__ Take these medicines the morning of surgery with A SIP OF WATER:    1. CITALOPRAM  2. DULOXETINE  3. GABAPENTIN  4.  5.  6.  ____ Fleet Enema (as directed)   __X__ Use CHG Soap/SAGE wipes as directed  ____ Use inhalers on the day of surgery  ____ Stop metformin 2 days prior to surgery    ____ Take 1/2 of usual insulin dose the night before surgery and none on the morning of surgery.   ____ Stop PLAVIX 5 DAYS PRIOR AS INSTRUCTED BY CARDIOLOGY  __X__ Stop Anti-inflammatories such as Advil, Aleve, Ibuprofen, Motrin, Naproxen, Naprosyn, Goodies,powder, or aspirin products.  OK to take Tylenol.   __X__ Stop supplements, Vitamin E, Fish Oil until after surgery.    ____ Bring C-Pap to the hospital.

## 2016-12-09 NOTE — Pre-Procedure Instructions (Signed)
Reviewed EKG with Dr Henrene HawkingKephart, orders to fax copy  to Dr Darrold JunkerParaschos.

## 2016-12-13 MED ORDER — CEFAZOLIN SODIUM-DEXTROSE 1-4 GM/50ML-% IV SOLN
1.0000 g | Freq: Once | INTRAVENOUS | Status: AC
  Administered 2016-12-14: 1 g via INTRAVENOUS

## 2016-12-14 ENCOUNTER — Other Ambulatory Visit: Payer: Self-pay

## 2016-12-14 ENCOUNTER — Encounter
Admission: RE | Disposition: A | Discharge status: Home / Self Care | Payer: Self-pay | Source: Ambulatory Visit | Attending: Cardiology

## 2016-12-14 ENCOUNTER — Observation Stay
Admission: RE | Admit: 2016-12-14 | Discharge: 2016-12-15 | Disposition: A | Discharge status: Home / Self Care | Payer: Medicare Other | Source: Ambulatory Visit | Attending: Cardiology | Admitting: Cardiology

## 2016-12-14 ENCOUNTER — Ambulatory Visit: Payer: Medicare Other

## 2016-12-14 ENCOUNTER — Ambulatory Visit: Payer: Medicare Other | Admitting: Anesthesiology

## 2016-12-14 ENCOUNTER — Observation Stay: Payer: Medicare Other

## 2016-12-14 ENCOUNTER — Encounter: Payer: Self-pay | Admitting: *Deleted

## 2016-12-14 DIAGNOSIS — F329 Major depressive disorder, single episode, unspecified: Secondary | ICD-10-CM | POA: Diagnosis not present

## 2016-12-14 DIAGNOSIS — Z79899 Other long term (current) drug therapy: Secondary | ICD-10-CM | POA: Diagnosis not present

## 2016-12-14 DIAGNOSIS — Z7902 Long term (current) use of antithrombotics/antiplatelets: Secondary | ICD-10-CM | POA: Diagnosis not present

## 2016-12-14 DIAGNOSIS — F0150 Vascular dementia without behavioral disturbance: Secondary | ICD-10-CM | POA: Diagnosis not present

## 2016-12-14 DIAGNOSIS — I10 Essential (primary) hypertension: Secondary | ICD-10-CM | POA: Insufficient documentation

## 2016-12-14 DIAGNOSIS — K760 Fatty (change of) liver, not elsewhere classified: Secondary | ICD-10-CM | POA: Insufficient documentation

## 2016-12-14 DIAGNOSIS — M1990 Unspecified osteoarthritis, unspecified site: Secondary | ICD-10-CM | POA: Insufficient documentation

## 2016-12-14 DIAGNOSIS — Z8673 Personal history of transient ischemic attack (TIA), and cerebral infarction without residual deficits: Secondary | ICD-10-CM | POA: Diagnosis not present

## 2016-12-14 DIAGNOSIS — M797 Fibromyalgia: Secondary | ICD-10-CM | POA: Insufficient documentation

## 2016-12-14 DIAGNOSIS — Z7982 Long term (current) use of aspirin: Secondary | ICD-10-CM | POA: Insufficient documentation

## 2016-12-14 DIAGNOSIS — R51 Headache: Secondary | ICD-10-CM | POA: Diagnosis not present

## 2016-12-14 DIAGNOSIS — Z950 Presence of cardiac pacemaker: Secondary | ICD-10-CM

## 2016-12-14 DIAGNOSIS — I495 Sick sinus syndrome: Secondary | ICD-10-CM | POA: Diagnosis present

## 2016-12-14 DIAGNOSIS — F419 Anxiety disorder, unspecified: Secondary | ICD-10-CM | POA: Insufficient documentation

## 2016-12-14 HISTORY — PX: PACEMAKER INSERTION: SHX728

## 2016-12-14 LAB — GLUCOSE, CAPILLARY: GLUCOSE-CAPILLARY: 102 mg/dL — AB (ref 65–99)

## 2016-12-14 SURGERY — INSERTION, CARDIAC PACEMAKER
Anesthesia: Monitor Anesthesia Care

## 2016-12-14 MED ORDER — POTASSIUM CHLORIDE CRYS ER 10 MEQ PO TBCR
20.0000 meq | EXTENDED_RELEASE_TABLET | Freq: Every day | ORAL | Status: DC
  Administered 2016-12-14 – 2016-12-15 (×2): 20 meq via ORAL
  Filled 2016-12-14 (×2): qty 2

## 2016-12-14 MED ORDER — LACTATED RINGERS IV SOLN
INTRAVENOUS | Status: DC
  Administered 2016-12-14: 12:00:00 via INTRAVENOUS

## 2016-12-14 MED ORDER — TRAZODONE HCL 100 MG PO TABS
100.0000 mg | ORAL_TABLET | Freq: Every day | ORAL | Status: DC
  Administered 2016-12-14: 100 mg via ORAL
  Filled 2016-12-14: qty 1

## 2016-12-14 MED ORDER — MIDAZOLAM HCL 2 MG/2ML IJ SOLN
INTRAMUSCULAR | Status: DC | PRN
  Administered 2016-12-14: 2 mg via INTRAVENOUS

## 2016-12-14 MED ORDER — PROPOFOL 10 MG/ML IV BOLUS
INTRAVENOUS | Status: AC
  Filled 2016-12-14: qty 40

## 2016-12-14 MED ORDER — FAMOTIDINE 20 MG PO TABS
20.0000 mg | ORAL_TABLET | Freq: Once | ORAL | Status: AC
  Administered 2016-12-14: 20 mg via ORAL

## 2016-12-14 MED ORDER — SODIUM CHLORIDE 0.9 % IR SOLN
Freq: Once | Status: DC
  Filled 2016-12-14: qty 2

## 2016-12-14 MED ORDER — GABAPENTIN 100 MG PO CAPS
100.0000 mg | ORAL_CAPSULE | Freq: Two times a day (BID) | ORAL | Status: DC
  Administered 2016-12-14 – 2016-12-15 (×2): 100 mg via ORAL
  Filled 2016-12-14 (×2): qty 1

## 2016-12-14 MED ORDER — FENTANYL CITRATE (PF) 100 MCG/2ML IJ SOLN
INTRAMUSCULAR | Status: AC
  Administered 2016-12-14: 25 ug via INTRAVENOUS
  Filled 2016-12-14: qty 2

## 2016-12-14 MED ORDER — ACETAMINOPHEN 325 MG PO TABS: 325.0000 mg | ORAL_TABLET | ORAL | Status: DC | PRN

## 2016-12-14 MED ORDER — TRIAMTERENE-HCTZ 37.5-25 MG PO TABS
1.0000 | ORAL_TABLET | Freq: Every day | ORAL | Status: DC
Start: 2016-12-14 — End: 2016-12-15
  Administered 2016-12-14 – 2016-12-15 (×2): 1 via ORAL
  Filled 2016-12-14 (×2): qty 1

## 2016-12-14 MED ORDER — ZOLPIDEM TARTRATE 5 MG PO TABS: 5.0000 mg | ORAL_TABLET | Freq: Every evening | ORAL | Status: DC | PRN

## 2016-12-14 MED ORDER — CEFAZOLIN SODIUM-DEXTROSE 1-4 GM/50ML-% IV SOLN
INTRAVENOUS | Status: AC
  Filled 2016-12-14: qty 50

## 2016-12-14 MED ORDER — MIDAZOLAM HCL 2 MG/2ML IJ SOLN
INTRAMUSCULAR | Status: AC
  Filled 2016-12-14: qty 2

## 2016-12-14 MED ORDER — ROPINIROLE HCL 0.25 MG PO TABS
0.5000 mg | ORAL_TABLET | Freq: Three times a day (TID) | ORAL | Status: DC
  Administered 2016-12-14: 0.5 mg via ORAL
  Filled 2016-12-14 (×3): qty 2
  Filled 2016-12-14 (×2): qty 1

## 2016-12-14 MED ORDER — FENTANYL CITRATE (PF) 100 MCG/2ML IJ SOLN
INTRAMUSCULAR | Status: DC | PRN
  Administered 2016-12-14: 25 ug via INTRAVENOUS

## 2016-12-14 MED ORDER — EPHEDRINE SULFATE 50 MG/ML IJ SOLN
INTRAMUSCULAR | Status: DC | PRN
  Administered 2016-12-14: 10 mg via INTRAVENOUS
  Administered 2016-12-14: 5 mg via INTRAVENOUS
  Administered 2016-12-14: 10 mg via INTRAVENOUS

## 2016-12-14 MED ORDER — CLONAZEPAM 0.5 MG PO TABS
0.5000 mg | ORAL_TABLET | Freq: Two times a day (BID) | ORAL | Status: DC | PRN
  Administered 2016-12-14: 0.5 mg via ORAL
  Filled 2016-12-14: qty 1

## 2016-12-14 MED ORDER — CEFAZOLIN SODIUM-DEXTROSE 1-4 GM/50ML-% IV SOLN: 1.0000 g | Freq: Four times a day (QID) | INTRAVENOUS | Status: DC

## 2016-12-14 MED ORDER — FENTANYL CITRATE (PF) 100 MCG/2ML IJ SOLN
INTRAMUSCULAR | Status: AC
  Filled 2016-12-14: qty 2

## 2016-12-14 MED ORDER — FENTANYL CITRATE (PF) 100 MCG/2ML IJ SOLN
25.0000 ug | INTRAMUSCULAR | Status: DC | PRN
  Administered 2016-12-14 (×4): 25 ug via INTRAVENOUS

## 2016-12-14 MED ORDER — ONDANSETRON HCL 4 MG/2ML IJ SOLN: 4.0000 mg | Freq: Four times a day (QID) | INTRAMUSCULAR | Status: DC | PRN

## 2016-12-14 MED ORDER — SODIUM CHLORIDE 0.9 % IR SOLN
Status: DC | PRN
  Administered 2016-12-14: 100 mL

## 2016-12-14 MED ORDER — LIDOCAINE 1 % OPTIME INJ - NO CHARGE
INTRAMUSCULAR | Status: DC | PRN
  Administered 2016-12-14: 30 mL

## 2016-12-14 MED ORDER — ONDANSETRON HCL 4 MG/2ML IJ SOLN: 4.0000 mg | Freq: Once | INTRAMUSCULAR | Status: DC | PRN

## 2016-12-14 MED ORDER — PRAVASTATIN SODIUM 40 MG PO TABS
40.0000 mg | ORAL_TABLET | Freq: Every day | ORAL | Status: DC
  Administered 2016-12-14: 40 mg via ORAL
  Filled 2016-12-14: qty 1

## 2016-12-14 MED ORDER — HYDROCODONE-ACETAMINOPHEN 7.5-325 MG PO TABS
1.0000 | ORAL_TABLET | Freq: Four times a day (QID) | ORAL | Status: DC | PRN
  Administered 2016-12-14: 1 via ORAL
  Filled 2016-12-14: qty 1

## 2016-12-14 MED ORDER — DEXTROSE 5 % IV SOLN
Freq: Four times a day (QID) | INTRAVENOUS | Status: AC
  Administered 2016-12-14 – 2016-12-15 (×3): via INTRAVENOUS
  Filled 2016-12-14 (×3): qty 10

## 2016-12-14 MED ORDER — CITALOPRAM HYDROBROMIDE 20 MG PO TABS
40.0000 mg | ORAL_TABLET | Freq: Every day | ORAL | Status: DC
  Administered 2016-12-14 – 2016-12-15 (×2): 40 mg via ORAL
  Filled 2016-12-14: qty 2
  Filled 2016-12-14: qty 1
  Filled 2016-12-14: qty 2

## 2016-12-14 MED ORDER — FAMOTIDINE 20 MG PO TABS
ORAL_TABLET | ORAL | Status: AC
  Administered 2016-12-14: 20 mg via ORAL
  Filled 2016-12-14: qty 1

## 2016-12-14 MED ORDER — PROPOFOL 10 MG/ML IV BOLUS
INTRAVENOUS | Status: AC
  Filled 2016-12-14: qty 20

## 2016-12-14 MED ORDER — PROPOFOL 500 MG/50ML IV EMUL
INTRAVENOUS | Status: DC | PRN
  Administered 2016-12-14: 150 ug/kg/min via INTRAVENOUS

## 2016-12-14 SURGICAL SUPPLY — 37 items
BAG DECANTER FOR FLEXI CONT (MISCELLANEOUS) ×2 IMPLANT
BRUSH SCRUB EZ  4% CHG (MISCELLANEOUS) ×1
BRUSH SCRUB EZ 4% CHG (MISCELLANEOUS) ×1 IMPLANT
CABLE SURG 12 DISP A/V CHANNEL (MISCELLANEOUS) ×2 IMPLANT
CANISTER SUCT 1200ML W/VALVE (MISCELLANEOUS) ×2 IMPLANT
CHLORAPREP W/TINT 26ML (MISCELLANEOUS) ×2 IMPLANT
COVER LIGHT HANDLE STERIS (MISCELLANEOUS) ×4 IMPLANT
COVER MAYO STAND STRL (DRAPES) IMPLANT
DRAPE C-ARM XRAY 36X54 (DRAPES) ×2 IMPLANT
DRSG TEGADERM 4X4.75 (GAUZE/BANDAGES/DRESSINGS) ×2 IMPLANT
DRSG TELFA 4X3 1S NADH ST (GAUZE/BANDAGES/DRESSINGS) ×2 IMPLANT
ELECT REM PT RETURN 9FT ADLT (ELECTROSURGICAL) ×2
ELECTRODE REM PT RTRN 9FT ADLT (ELECTROSURGICAL) ×1 IMPLANT
GLOVE BIO SURGEON STRL SZ7.5 (GLOVE) ×2 IMPLANT
GLOVE BIO SURGEON STRL SZ8 (GLOVE) ×2 IMPLANT
GOWN STRL REUS W/ TWL LRG LVL3 (GOWN DISPOSABLE) ×1 IMPLANT
GOWN STRL REUS W/ TWL XL LVL3 (GOWN DISPOSABLE) ×1 IMPLANT
GOWN STRL REUS W/TWL LRG LVL3 (GOWN DISPOSABLE) ×1
GOWN STRL REUS W/TWL XL LVL3 (GOWN DISPOSABLE) ×1
IMMOBILIZER SHDR MD LX WHT (SOFTGOODS) ×2 IMPLANT
IMMOBILIZER SHDR XL LX WHT (SOFTGOODS) IMPLANT
INTRO PACEMAKR LEAD 9FR 13CM (INTRODUCER) ×2
INTRO PACEMKR SHEATH II 7FR (MISCELLANEOUS) ×2
INTRODUCER PACEMKR LD 9FR 13CM (INTRODUCER) ×1 IMPLANT
INTRODUCER PACEMKR SHTH II 7FR (MISCELLANEOUS) ×1 IMPLANT
IPG PACE AZUR XT DR MRI W1DR01 (Pacemaker) ×1 IMPLANT
IV NS 500ML (IV SOLUTION) ×1
IV NS 500ML BAXH (IV SOLUTION) ×1 IMPLANT
KIT RM TURNOVER STRD PROC AR (KITS) ×2 IMPLANT
LABEL OR SOLS (LABEL) IMPLANT
LEAD CAPSURE NOVUS 5076-52CM (Lead) ×2 IMPLANT
MARKER SKIN DUAL TIP RULER LAB (MISCELLANEOUS) ×2 IMPLANT
PACE AZURE XT DR MRI W1DR01 (Pacemaker) ×2 IMPLANT
PACEMAKER LEAD ATRL (Lead) ×2 IMPLANT
PACK PACE INSERTION (MISCELLANEOUS) ×2 IMPLANT
PAD ONESTEP ZOLL R SERIES ADT (MISCELLANEOUS) ×2 IMPLANT
SUT SILK 0 SH 30 (SUTURE) ×6 IMPLANT

## 2016-12-14 NOTE — Anesthesia Post-op Follow-up Note (Signed)
Anesthesia QCDR form completed.        

## 2016-12-14 NOTE — Anesthesia Preprocedure Evaluation (Signed)
Anesthesia Evaluation  Patient identified by MRN, date of birth, ID band Patient awake    Reviewed: Allergy & Precautions, H&P , NPO status , Patient's Chart, lab work & pertinent test results, reviewed documented beta blocker date and time   History of Anesthesia Complications (+) PONV and history of anesthetic complications  Airway Mallampati: II  TM Distance: >3 FB Neck ROM: full    Dental no notable dental hx. (+) Teeth Intact   Pulmonary neg pulmonary ROS,    Pulmonary exam normal breath sounds clear to auscultation       Cardiovascular Exercise Tolerance: Good hypertension, On Medications negative cardio ROS  + dysrhythmias  Rhythm:regular Rate:Normal     Neuro/Psych PSYCHIATRIC DISORDERS TIA Neuromuscular disease CVA, No Residual Symptoms negative neurological ROS  negative psych ROS   GI/Hepatic negative GI ROS, Neg liver ROS, GERD  Medicated,  Endo/Other  negative endocrine ROSdiabetes  Renal/GU      Musculoskeletal   Abdominal   Peds  Hematology negative hematology ROS (+)   Anesthesia Other Findings   Reproductive/Obstetrics negative OB ROS                             Anesthesia Physical Anesthesia Plan  ASA: III  Anesthesia Plan: MAC   Post-op Pain Management:    Induction:   PONV Risk Score and Plan: 4 or greater  Airway Management Planned:   Additional Equipment:   Intra-op Plan:   Post-operative Plan:   Informed Consent: I have reviewed the patients History and Physical, chart, labs and discussed the procedure including the risks, benefits and alternatives for the proposed anesthesia with the patient or authorized representative who has indicated his/her understanding and acceptance.     Plan Discussed with: CRNA  Anesthesia Plan Comments:         Anesthesia Quick Evaluation

## 2016-12-14 NOTE — Plan of Care (Signed)
  Progressing Education: Knowledge of cardiac device and self-care will improve 12/14/2016 1559 - Progressing by Tomie ChinaJackson, Brandy Kabat Cecelie, RN Ability to safely manage health related needs after discharge will improve 12/14/2016 1559 - Progressing by Tomie ChinaJackson, Lekisha Mcghee Cecelie, RN Cardiac: Ability to achieve and maintain adequate cardiopulmonary perfusion will improve 12/14/2016 1559 - Progressing by Tomie ChinaJackson, Kristle Wesch Cecelie, RN Education: Knowledge of General Education information will improve 12/14/2016 1559 - Progressing by Tomie ChinaJackson, Adanely Reynoso Cecelie, RN Activity: Risk for activity intolerance will decrease 12/14/2016 1559 - Progressing by Tomie ChinaJackson, Lyra Alaimo Cecelie, RN Pain Managment: General experience of comfort will improve 12/14/2016 1559 - Progressing by Tomie ChinaJackson, Keelyn Monjaras Cecelie, RN Safety: Ability to remain free from injury will improve 12/14/2016 1559 - Progressing by Tomie ChinaJackson, Domani Bakos Cecelie, RN Skin Integrity: Risk for impaired skin integrity will decrease 12/14/2016 1559 - Progressing by Tomie ChinaJackson, Eyoel Throgmorton Cecelie, RN

## 2016-12-14 NOTE — Anesthesia Procedure Notes (Signed)
Date/Time: 12/14/2016 12:44 PM Performed by: Stormy Fabianurtis, Floy Riegler, CRNA Pre-anesthesia Checklist: Patient identified, Emergency Drugs available, Suction available and Patient being monitored Patient Re-evaluated:Patient Re-evaluated prior to induction Oxygen Delivery Method: Simple face mask Induction Type: IV induction Dental Injury: Teeth and Oropharynx as per pre-operative assessment

## 2016-12-14 NOTE — Interval H&P Note (Signed)
History and Physical Interval Note:  12/14/2016 12:00 PM  Maria Horton  has presented today for surgery, with the diagnosis of SSS, BRADYCARDIA  The various methods of treatment have been discussed with the patient and family. After consideration of risks, benefits and other options for treatment, the patient has consented to  Procedure(s): INSERTION PACEMAKER (N/A) as a surgical intervention .  The patient's history has been reviewed, patient examined, no change in status, stable for surgery.  I have reviewed the patient's chart and labs.  Questions were answered to the patient's satisfaction.     Valentin Benney [facility]

## 2016-12-14 NOTE — Transfer of Care (Signed)
Immediate Anesthesia Transfer of Care Note  Patient: Maria Horton  Procedure(s) Performed: Procedure(s): INSERTION PACEMAKER (N/A)  Patient Location: PACU  Anesthesia Type:General  Level of Consciousness: sedated  Airway & Oxygen Therapy: Patient Spontanous Breathing and Patient connected to face mask oxygen  Post-op Assessment: Report given to RN and Post -op Vital signs reviewed and stable  Post vital signs: Reviewed and stable  Last Vitals:  Vitals:   12/14/16 1135 12/14/16 1414  BP: (!) 135/57 123/63  Pulse: (!) 55 77  Resp: 16 14  Temp: 36.6 C (!) 36.1 C  SpO2: 99% 96%    Complications: No apparent anesthesia complications

## 2016-12-14 NOTE — Op Note (Signed)
[facility]   12/14/2016                     2:17 PM  PATIENT:  Maria Horton    PRE-OPERATIVE DIAGNOSIS:  Sick Sinus Syndrome, BRADYCARDIA  POST-OPERATIVE DIAGNOSIS:  Same  PROCEDURE:  INSERTION PACEMAKER  SURGEON:  Marcina MillardAlexander Earl Losee, MD    ANESTHESIA:     PREOPERATIVE INDICATIONS:  Maria Horton is a  76 y.o. female with a diagnosis of Sick Sinus Syndrome, BRADYCARDIA who failed conservative measures and elected for surgical management.    The risks benefits and alternatives were discussed with the patient preoperatively including but not limited to the risks of infection, bleeding, cardiopulmonary complications, the need for revision surgery, among others, and the patient was willing to proceed.   OPERATIVE PROCEDURE: The patient was brought to the operating room in the fasting state.  Left pectoral region was prepped and draped in usual sterile manner.  Anesthesia was obtained 1% lidocaine locally.  A 6 cm incision was performed the left pectoral region.  Pacemaker pocket was generated by electrocautery and blunt dissection.  Access was obtained to the left subclavian vein by fine-needle aspiration.  MRI compatible leads were positioned in the right ventricular apical septum ( Medtronic [id number] ) and right atrial appendage ( Medtronic [id number] ) under fluoroscopic guidance.  After proper thresholds were obtained the leads were sutured in place.  The leads were connected to an MRI compatible dual-chamber rate responsive pacemaker generator ( Medtronic [id number] ).  Pacemaker pocket was irrigated with gentamicin solution.  The pacemaker generator was positioned into the pocket and the pocket was closed with 2-0 and 4-0 Vicryl, respectively.  Steri-Strips and pressure dressing were applied.  There were no periprocedural complications.  Postprocedural interrogation revealed appropriate dual-chamber atrial and ventricular sensing and pacing thresholds.

## 2016-12-14 NOTE — H&P (Signed)
Jump to Section ? Document InformationEncounter DetailsHistorical MedicationsLast Filed Vital SignsOrdered PrescriptionsPatient DemographicsPatient InstructionsPlan of TreatmentProgress NotesReason for VisitSocial HistoryVisit Diagnoses Maria Horton Encounter Summary, generated on Nov. 16, 2018 Printout Information  Document Contents Document Received Date Document Source Organization  Office Visit Nov. 16, 2018 [facility]   Patient Demographics - 76 y.o. Female; born 1940-11-27   Patient Address Communication Language Race / Ethnicity  [address] [place], [place] [phone] [facility]) [phone] [facility]) [email or web address] English (Preferred) White / Not Hispanic or Latino   Reason for Visit   Reason Comments  Follow-up 1 week, holter  Chest Pain some  Shortness of Breath some  Headache since hosptial stay    Encounter Details   Date Type Department Care Team Description  11/30/2016 Office Visit [facility]  [address]  [place], [place] [address]  [phone]  Marcina Millard, MD  [address]  [facility]  [place], [place] [address]  [phone]  [phone] ([address]    Ladean Raya, [place]  [address] [facility] [address] [address]  [facility]  [place], [place] [address]  [phone]  [phone] (Fax)  Essential hypertension (Primary Dx);  Cerebrovascular accident (CVA), unspecified mechanism (CMS-HCC);  Bradycardia   Social History - as of this encounter  Tobacco Use Types Packs/Day Years Used Date  Never Smoker      Smokeless Tobacco: Never Used      Alcohol Use Drinks/Week oz/Week Comments  No      Sex Assigned at [employer] Date Recorded  Not on file    Job Start Date Occupation Industry  Not on file Not on file Not on file   Travel History Travel Start Travel End  No recent travel history available.     Last Filed Vital Signs - in this  encounter  Vital Sign Reading Time Taken  Blood Pressure 137/90 11/30/2016 11:31 AM EST  Pulse 36 11/30/2016 11:31 AM EST  Temperature - -  Respiratory Rate - -  Oxygen Saturation - -  Inhaled Oxygen Concentration - -  Weight 66.2 kg (146 lb) 11/30/2016 11:31 AM EST  Height 162.6 cm (5\' 4" ) 11/30/2016 11:31 AM EST  Body Mass Index 25.06 11/30/2016 11:31 AM EST   Patient Instructions - in this encounter  Patient Instructions Ladean Raya, PA - 11/30/2016 11:15 AM EST   Patient Education    DASH Diet: Care Instructions Your Care Instructions  The DASH diet is an eating plan that can help lower your blood pressure. DASH stands for Dietary Approaches to Stop Hypertension. Hypertension is high blood pressure. The DASH diet focuses on eating foods that are high in calcium, potassium, and magnesium. These nutrients can lower blood pressure. The foods that are highest in these nutrients are fruits, vegetables, low-fat dairy products, nuts, seeds, and legumes. But taking calcium, potassium, and magnesium supplements instead of eating foods that are high in those nutrients does not have the same effect. The DASH diet also includes whole grains, fish, and poultry. The DASH diet is one of several lifestyle changes your doctor may recommend to lower your high blood pressure. Your doctor may also want you to decrease the amount of sodium in your diet. Lowering sodium while following the DASH diet can lower blood pressure even further than just the DASH diet alone. Follow-up care is a key part of your treatment and safety. Be sure to make and go to all appointments, and call your doctor if you are having problems. It's  also a good idea to know your test results and keep a list of the medicines you take. How can you care for yourself at home? Following the DASH diet  Eat 4 to 5 servings of fruit each day. A serving is 1 medium-sized piece of fruit,  cup chopped or canned fruit, 1/4 cup dried  fruit, or 4 ounces ( cup) of fruit juice. Choose fruit more often than fruit juice.  Eat 4 to 5 servings of vegetables each day. A serving is 1 cup of lettuce or raw leafy vegetables,  cup of chopped or cooked vegetables, or 4 ounces ( cup) of vegetable juice. Choose vegetables more often than vegetable juice.  Get 2 to 3 servings of low-fat and fat-free dairy each day. A serving is 8 ounces of milk, 1 cup of yogurt, or 1  ounces of cheese.  Eat 6 to 8 servings of grains each day. A serving is 1 slice of bread, 1 ounce of dry cereal, or  cup of cooked rice, pasta, or cooked cereal. Try to choose whole-grain products as much as possible.  Limit lean meat, poultry, and fish to 2 servings each day. A serving is 3 ounces, about the size of a deck of cards.  Eat 4 to 5 servings of nuts, seeds, and legumes (cooked dried beans, lentils, and split peas) each week. A serving is 1/3 cup of nuts, 2 tablespoons of seeds, or  cup of cooked beans or peas.  Limit fats and oils to 2 to 3 servings each day. A serving is 1 teaspoon of vegetable oil or 2 tablespoons of salad dressing.  Limit sweets and added sugars to 5 servings or less a week. A serving is 1 tablespoon jelly or jam,  cup sorbet, or 1 cup of lemonade.  Eat less than 2,300 milligrams (mg) of sodium a day. If you limit your sodium to 1,500 mg a day, you can lower your blood pressure even more. Tips for success  Start small. Do not try to make dramatic changes to your diet all at once. You might feel that you are missing out on your favorite foods and then be more likely to not follow the plan. Make small changes, and stick with them. Once those changes become habit, add a few more changes.  Try some of the following: ? Make it a goal to eat a fruit or vegetable at every meal and at snacks. This will make it easy to get the recommended amount of fruits and vegetables each day. ? Try yogurt topped with fruit and nuts for a snack or healthy  dessert. ? Add lettuce, tomato, cucumber, and onion to sandwiches. ? Combine a ready-made pizza crust with low-fat mozzarella cheese and lots of vegetable toppings. Try using tomatoes, squash, spinach, broccoli, carrots, cauliflower, and onions. ? Have a variety of cut-up vegetables with a low-fat dip as an appetizer instead of chips and dip. ? Sprinkle sunflower seeds or chopped almonds over salads. Or try adding chopped walnuts or almonds to cooked vegetables. ? Try some vegetarian meals using beans and peas. Add garbanzo or kidney beans to salads. Make burritos and tacos with mashed pinto beans or black beans. Where can you learn more? Log in to your [facility] account at [email or web address] and click on top menu option "Health" then select "Search Medical Library". Enter [phone] in the search box and click the magnify glass to learn more about "DASH Diet: Care Instructions." Current as of: December 31, 2015 Content Version:  11.7  2006-2018 [employer]. Care instructions adapted under license by your healthcare professional. If you have questions about a medical condition or this instruction, always ask your healthcare professional. [employer], Inc[employer] disclaims any warranty or liability for your use of this information.     Electronically Signed by Ladean Rayarane, Anna Maria, PA on 11/30/2016 11:15 AM EST       Ordered Prescriptions - in this encounter  Prescription Sig Dispensed Refills Start Date End Date  potassium chloride (K-DUR,KLOR-CON) 20 MEQ ER tablet  Take one tablet once daily. 90 tablet  3 11/30/2016   triamterene-hydrochlorothiazide (MAXZIDE-25) 37.5-25 mg tablet  Take 1 tablet by mouth once daily 90 tablet  3 11/30/2016 11/30/2017   Progress Notes - in this encounter  Ladean RayaDrane, Anna Maria, PA - 11/30/2016 11:15 AM EST Formatting of this note might be different from the original. New Patient Visit   Chief Complaint: Chief Complaint  Patient presents  with  . Follow-up  1 week, holter  . Chest Pain  some  . Shortness of Breath  some  . Headache  since hosptial stay  Date of Service: 11/30/2016 Date of Birth: Sep 24, 1940 PCP: Enzo MontgomeryJohnston, John David, MD  History of Present Illness: Maria Horton is a 76 y.o.female patient who returns today for bradycardia and status post two strokes, accompanied by her daughter. The patient has a history of hypertension, stroke in August 2018, and vascular dementia. The patient was [facility] for acute onset of imbalance with subsequent fall, with acute aphasia. The patient's heart rate and blood pressure were noted to be low, with heart rate in the upper 30s-40s. Brain MRI revealed subcentimeter foci of cortical acute/early subacute infarction present in left frontal lobe and right frontoparietal junction, suggestive of embolic phenomenon. While admitted she remained in sinus bradycardia without evidence of atrial fibrillation. This most recent stroke occurred while the patient was on Plavix. 2D echocardiogram revealed normal left ventricular function, with LVEF 55-65% with mild to moderate mitral regurgitation. Carotid ultrasound revealed insignificant disease. Pacemaker implantation was deferred in light of acute stroke, with possible reversibility of bradycardia. 48-hour Holter monitor on 11/30/16 revealed predominant sinus bradycardia with a mean heart rate of 46 bpm, with maximum heart rate of 71 bpm, and a minimum heart rate of 32 bpm, without evidence of atrial fibrillation or heart block. The patient reports lack of energy and fatigue, and has not been doing her normal activities. She denies chest pain. She has mild exertional shortness of breath. She denies experiencing palpitations or heart racing. She denies presyncope or syncope. Based on the patient's symptoms and the significant bradycardia noted today and on Holter monitor, pacemaker has been recommended, which will also monitor for  subclinical atrial fibrillation with frequent interrogations.  The patient has essential hypertension, systolic and diastolic blood pressure elevated today, currently on diet therapy. The patient had previously been on triamterene-hydrochlorothiazide, which was discontinued during her hospital admission. The patient reports a constant headache with a throbbing sensation since discharge, likely due to elevated blood pressure. The patient states that her home blood pressure has been as high as 170/100.  Past Medical and Surgical History  Past Medical History Past Medical History:  Diagnosis Date  . Anxiety, unspecified  . Atherosclerosis  . Colon polyps  . Depression, unspecified  . Diverticulitis  s/p surgery  . Fatty liver  . Fibromyalgia  . Fibrosis lung (CMS-HCC)  R middle lobe  . Gastroparesis  . GERD (gastroesophageal reflux disease)  . Hypertension  .  Osteoarthritis  . Stroke (CMS-HCC)  . TIA (transient ischemic attack)  . Vascular dementia   Past Surgical History She has a past surgical history that includes Colectomy Partial W/Anastamosis; Cholecystectomy; Arthroscopic Rotator Cuff Repair (Right); Abdominoplasty; bunionectomy; Hysterectomy Vaginal; and tonsilecomy.   Medications and Allergies  Current Medications Current Outpatient Medications  Medication Sig Dispense Refill  . albuterol 90 mcg/actuation inhaler Inhale into the lungs  . aspirin 81 MG EC tablet Take 81 mg by mouth daily.  . citalopram (CELEXA) 40 MG tablet Take 40 mg by mouth daily.  . clonazePAM (KLONOPIN) 1 MG tablet Take 2 tablets (2 mg total) by mouth nightly. (Patient taking differently: Take 0.5 mg by mouth once daily ) 60 tablet 1  . clopidogrel (PLAVIX) 75 mg tablet Take 75 mg by mouth once daily 11  . gabapentin (NEURONTIN) 100 MG capsule Take 100 mg by mouth 2 (two) times daily  . HYDROcodone-acetaminophen (NORCO) 7.5-325 mg tablet Take 1 tablet by mouth every 6 (six) hours as needed for Pain.    . hydrOXYzine pamoate (VISTARIL) 25 MG capsule Take by mouth  . meloxicam (MOBIC) 15 MG tablet Take 15 mg by mouth once daily  . pravastatin (PRAVACHOL) 40 MG tablet Take 40 mg by mouth every evening 0  . rOPINIRole (REQUIP) 0.5 MG tablet Take 0.5 mg by mouth 3 (three) times daily  . traZODone (DESYREL) 100 MG tablet Take by mouth nightly  . potassium chloride (K-DUR,KLOR-CON) 20 MEQ ER tablet Take one tablet once daily. 90 tablet 3  . triamterene-hydrochlorothiazide (MAXZIDE-25) 37.5-25 mg tablet Take 1 tablet by mouth once daily 90 tablet 3  . zolpidem (AMBIEN) 5 MG tablet Take 1 tablet (5 mg total) by mouth nightly as needed for Sleep. . 30 tablet 0   No current facility-administered medications for this visit.   Allergies Beta-blockers (beta-adrenergic blocking agts); Statins-hmg-coa reductase inhibitors; Ace inhibitors; and Amlodipine  Social and Family History  Social History reports that she has never smoked. She has never used smokeless tobacco. She reports that she does not drink alcohol.  Family History family history includes Breast cancer in her sister; Cerebral aneurysm in her child; Diabetes type II in her father, mother, and sister; Emphysema in her mother; Heart disease in her mother; High blood pressure (Hypertension) in her child and mother.   Review of Systems   Review of Systems: The patient denies chest pain, shortness of breath, orthopnea, paroxysmal nocturnal dyspnea, pedal edema, palpitations, heart racing, presyncope, syncope, with lack of energy and fatigue, with constant headache. Review of 12 Systems is negative except as described above.  Physical Examination   Vitals:BP (!) 152/92  Pulse 50  Ht 162.6 cm (5\' 4" )  Wt 66.2 kg (146 lb)  BMI 25.06 kg/m  Ht:162.6 cm (5\' 4" ) Wt:66.2 kg (146 lb) ZOX:WRUE surface area is 1.73 meters squared. Body mass index is 25.06 kg/m.  General: Alert and oriented. Well-appearing. No acute distress. HEENT: Pupils  equally reactive to light and accomodation  Neck: Supple Lungs: Normal effort of breathing; clear to auscultation bilaterally; no wheezes, rales, rhonchi Heart: Regular rhythm, bradycardic. No murmur, rub, or gallop Abdomen: nondistended, with normal bowel sounds Extremities: no cyanosis, clubbing, or edema Peripheral Pulses: 2+ radial Skin: Warm, dry, no diaphoresis Psych: Good affect, respond appropriately  Assessment   76 y.o. female with  1. Essential hypertension  2. Cerebrovascular accident (CVA), unspecified mechanism (CMS-HCC)  3. Bradycardia   76 year old female with a history of hypertension and stroke in August 2018,  with a [facility] for recurrent stroke, noted to be bradycardic during the hospitalization. This most recent stroke occurred while on Plavix. 48-hour Holter monitor revealed predominant sinus bradycardia with an average heart rate of 46 bpm, with a minimum heart rate of 32 bpm. There was no evidence of heart block or atrial fibrillation. She reports lack of energy and fatigue, without presyncope or syncope. The patient has essential hypertension, previously on triamterene-hydrochlorothiazide, now on diet therapy. Systolic and diastolic blood pressure elevated today. Patient complains of constant headache. The risks, benefits, and alternatives of pacemaker implantation were discussed thoroughly with the patient and her daughter, as well as deferring pacemaker implantation and proceeding with Linq implantable monitor for further evaluation of subclinical atrial fibrillation and bradycardia, and the patient wished to proceed with pacemaker implantation.  Plan   1. Continue current medications. 2. DASH diet printed instructions given to the patient. 3. Restart triamterene-hydrochlorothiazide 37.5-25 mg 4. Klor-Con 20 mEq 5. Recheck BMP in 3 weeks 6. Referral to neurology to establish care, status post 2 strokes within 2 months apart. 7. The  risks, benefits, and alternatives of pacemaker implantation versus Linq implantable monitor, were discussed with the patient and her daughter, and the patient wished to proceed with pacemaker implantation, and written patient consent was obtained. 8. We will plan to frequently interrogate pacemaker to further evaluate for subclinical atrial fibrillation as a cause of recurrent cryptogenic stroke. 9. Plan to stop Plavix 5 days before pacemaker implantation, and resume as soon as possible. Continue aspirin. 10. Return to clinic about 1 week post pacemaker implantation.  No orders of the defined types were placed in this encounter.  Return in about 1 week (around 12/07/2016) for after pacemaker. The exam findings and history were discussed with Dr. Darrold JunkerParaschos and the plan was made in collaboration with him.  I personally performed the service, non-incident to. (WP)  ANNA Domenica ReamerMARIA DRANE, PA-C    Electronically Signed by Ladean Rayarane, Anna Maria, PA on 11/30/2016 11:15 AM EST     Plan of Treatment - as of this encounter  Upcoming Encounters Upcoming Encounters  Date Type Specialty Care Team Description  05/30/2017 Ancillary Orders Lab Enzo MontgomeryJohnston, John David, MD  [address]  [place], [place] [address]  [phone]  [phone] (Fax)    06/06/2017 Office Visit Internal Medicine Enzo MontgomeryJohnston, John David, MD  [address]  [place], [place] [address]  [phone]  [phone] (Fax)     Visit Diagnoses - in this encounter  Diagnosis  Essential hypertension - Primary   Cerebrovascular accident (CVA), unspecified mechanism (CMS-HCC)   Bradycardia  Other specified cardiac dysrhythmias    Historical Medications - added in this encounter  This list may reflect changes made after this encounter.  Medication Sig Dispensed Refills Start Date End Date  albuterol 90 mcg/actuation inhaler  Inhale into the lungs  0  12/06/2016   Images Document Information  Primary Care  Provider Other Service Providers Document Coverage Dates  Enzo MontgomeryJohn David Johnston MD (Oct. 24, 2018 - Present) [phone] (Work) [phone] (Fax) [address] [place], [place] [address]   Nov. 06, 2018   Custodian Organization  [facility] [address] [place], [place] [address]   Encounter Providers Encounter Date  Ladean Rayanna Maria Drane PA (Attending) [phone] (Work) [phone] (Fax) [address] [facility] [place], [place] [address]  Nov. 06, 2018    Show All Sections

## 2016-12-15 ENCOUNTER — Encounter: Payer: Self-pay | Admitting: Cardiology

## 2016-12-15 DIAGNOSIS — I495 Sick sinus syndrome: Secondary | ICD-10-CM | POA: Diagnosis not present

## 2016-12-15 MED ORDER — SODIUM CHLORIDE 0.9% FLUSH
3.0000 mL | Freq: Two times a day (BID) | INTRAVENOUS | Status: DC
  Administered 2016-12-15: 3 mL via INTRAVENOUS

## 2016-12-15 MED ORDER — CEPHALEXIN 250 MG PO CAPS: 250.0000 mg | ORAL_CAPSULE | Freq: Four times a day (QID) | ORAL | 0 refills | Status: DC
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
## 2016-12-15 NOTE — Care Management Obs Status (Signed)
MEDICARE OBSERVATION STATUS NOTIFICATION   Patient Details  Name: Maria CoronaMary L Kanan MRN: [id number] Date of Birth: 1940/11/22   Medicare Observation Status Notification Given:  No Outpatient in bed procedure without complications.  Discharge order written < 24 hours of being placed in observation   Eber HongGreene, Ita Fritzsche R, RN 12/15/2016, 12:29 PM

## 2016-12-15 NOTE — Plan of Care (Signed)
  Education: Knowledge of cardiac device and self-care will improve 12/15/2016 0109 - Progressing by Dorna LeitzNesbitt, Zalia Hautala M, RN

## 2016-12-15 NOTE — Plan of Care (Signed)
  Adequate for Discharge Education: Knowledge of cardiac device and self-care will improve 12/15/2016 1052 - Adequate for Discharge by Weldon PickingAlejo Calderon, Manuella GhaziBerenice, RN Ability to safely manage health related needs after discharge will improve 12/15/2016 1052 - Adequate for Discharge by Erma HeritageAlejo Calderon, Gianlucca Szymborski, RN Cardiac: Ability to achieve and maintain adequate cardiopulmonary perfusion will improve 12/15/2016 1052 - Adequate for Discharge by Erma HeritageAlejo Calderon, Daviyon Widmayer, RN Education: Knowledge of General Education information will improve 12/15/2016 1052 - Adequate for Discharge by Erma HeritageAlejo Calderon, Jontavius Rabalais, RN Activity: Risk for activity intolerance will decrease 12/15/2016 1052 - Adequate for Discharge by Erma HeritageAlejo Calderon, Slayde Brault, RN Pain Managment: General experience of comfort will improve 12/15/2016 1052 - Adequate for Discharge by Erma HeritageAlejo Calderon, Jarae Panas, RN Safety: Ability to remain free from injury will improve 12/15/2016 1052 - Adequate for Discharge by Erma HeritageAlejo Calderon, Zeynab Klett, RN Skin Integrity: Risk for impaired skin integrity will decrease 12/15/2016 1052 - Adequate for Discharge by Erma HeritageAlejo Calderon, Ree Alcalde, RN

## 2016-12-15 NOTE — Progress Notes (Signed)
Discharge instructions given. Tele off, both IV's out. Family at bedside with no further questions. Pt and family verbalized understanding. Pt transported via wheelchair.

## 2016-12-15 NOTE — Discharge Instructions (Signed)
For 6 weeks, avoid lifting greater than 15 pounds or raising your arms above your head. Do not drive for one week. We will discuss driving when you come for a follow-up appointment in 1 week. You may shower in 24 hours, but avoid direct contact with the shower head to incision site. If the strips become wet, pat them dry. They may itch and even fall off; that's okay. Leave steri-strips alone otherwise.  You may resume your aspirin and Plavix on Friday, 12/17/16.

## 2016-12-15 NOTE — Anesthesia Postprocedure Evaluation (Signed)
Anesthesia Post Note  Patient: Maria Horton  Procedure(s) Performed: INSERTION PACEMAKER (N/A )  Anesthesia Type: MAC     Last Vitals:  Vitals:   12/15/16 0614 12/15/16 0750  BP: (!) 111/58 104/62  Pulse: 60 (!) 58  Resp:    Temp: 36.8 C 36.8 C  SpO2: 93% 96%    Last Pain:  Vitals:   12/15/16 0750  TempSrc: Oral  PainSc:                  Molli Barrows

## 2017-03-14 ENCOUNTER — Other Ambulatory Visit: Payer: Self-pay | Admitting: Internal Medicine

## 2017-03-14 DIAGNOSIS — N649 Disorder of breast, unspecified: Secondary | ICD-10-CM

## 2017-03-14 DIAGNOSIS — N63 Unspecified lump in unspecified breast: Secondary | ICD-10-CM

## 2017-03-21 ENCOUNTER — Other Ambulatory Visit: Payer: Self-pay | Admitting: *Deleted

## 2017-03-21 ENCOUNTER — Inpatient Hospital Stay
Admission: RE | Admit: 2017-03-21 | Discharge: 2017-03-21 | Disposition: A | Discharge status: Home / Self Care | Payer: Self-pay | Source: Ambulatory Visit | Attending: *Deleted | Admitting: *Deleted

## 2017-03-21 DIAGNOSIS — Z9289 Personal history of other medical treatment: Secondary | ICD-10-CM

## 2017-03-30 ENCOUNTER — Ambulatory Visit
Admission: RE | Admit: 2017-03-30 | Discharge: 2017-03-30 | Disposition: A | Discharge status: Home / Self Care | Payer: Medicare Other | Source: Ambulatory Visit | Attending: Internal Medicine | Admitting: Internal Medicine

## 2017-03-30 DIAGNOSIS — N641 Fat necrosis of breast: Secondary | ICD-10-CM | POA: Insufficient documentation

## 2017-03-30 DIAGNOSIS — N649 Disorder of breast, unspecified: Secondary | ICD-10-CM

## 2017-03-30 DIAGNOSIS — N6320 Unspecified lump in the left breast, unspecified quadrant: Secondary | ICD-10-CM | POA: Diagnosis present

## 2017-03-30 DIAGNOSIS — N6310 Unspecified lump in the right breast, unspecified quadrant: Secondary | ICD-10-CM | POA: Diagnosis present

## 2017-03-30 DIAGNOSIS — N63 Unspecified lump in unspecified breast: Secondary | ICD-10-CM

## 2017-09-06 ENCOUNTER — Other Ambulatory Visit: Payer: Self-pay | Admitting: General Surgery

## 2017-09-06 DIAGNOSIS — N63 Unspecified lump in unspecified breast: Secondary | ICD-10-CM

## 2017-09-06 DIAGNOSIS — N6489 Other specified disorders of breast: Secondary | ICD-10-CM

## 2017-10-05 ENCOUNTER — Ambulatory Visit
Admission: RE | Admit: 2017-10-05 | Discharge: 2017-10-05 | Disposition: A | Discharge status: Home / Self Care | Payer: Medicare Other | Source: Ambulatory Visit | Attending: General Surgery | Admitting: General Surgery

## 2017-10-05 DIAGNOSIS — N6489 Other specified disorders of breast: Secondary | ICD-10-CM

## 2017-10-05 DIAGNOSIS — N63 Unspecified lump in unspecified breast: Secondary | ICD-10-CM | POA: Diagnosis present

## 2017-10-08 ENCOUNTER — Emergency Department: Payer: Medicare Other

## 2017-10-08 ENCOUNTER — Emergency Department
Admission: EM | Admit: 2017-10-08 | Discharge: 2017-10-08 | Disposition: A | Discharge status: Home / Self Care | Payer: Medicare Other | Attending: Emergency Medicine | Admitting: Emergency Medicine

## 2017-10-08 ENCOUNTER — Other Ambulatory Visit: Payer: Self-pay

## 2017-10-08 DIAGNOSIS — R42 Dizziness and giddiness: Secondary | ICD-10-CM | POA: Diagnosis not present

## 2017-10-08 DIAGNOSIS — Z91040 Latex allergy status: Secondary | ICD-10-CM | POA: Diagnosis not present

## 2017-10-08 DIAGNOSIS — I10 Essential (primary) hypertension: Secondary | ICD-10-CM | POA: Insufficient documentation

## 2017-10-08 DIAGNOSIS — R531 Weakness: Secondary | ICD-10-CM

## 2017-10-08 DIAGNOSIS — R51 Headache: Secondary | ICD-10-CM | POA: Diagnosis not present

## 2017-10-08 DIAGNOSIS — M6281 Muscle weakness (generalized): Secondary | ICD-10-CM | POA: Diagnosis not present

## 2017-10-08 DIAGNOSIS — Z7982 Long term (current) use of aspirin: Secondary | ICD-10-CM | POA: Diagnosis not present

## 2017-10-08 DIAGNOSIS — Z950 Presence of cardiac pacemaker: Secondary | ICD-10-CM | POA: Diagnosis not present

## 2017-10-08 DIAGNOSIS — R0602 Shortness of breath: Secondary | ICD-10-CM | POA: Insufficient documentation

## 2017-10-08 DIAGNOSIS — M25532 Pain in left wrist: Secondary | ICD-10-CM | POA: Insufficient documentation

## 2017-10-08 DIAGNOSIS — Z8673 Personal history of transient ischemic attack (TIA), and cerebral infarction without residual deficits: Secondary | ICD-10-CM | POA: Insufficient documentation

## 2017-10-08 DIAGNOSIS — Z79899 Other long term (current) drug therapy: Secondary | ICD-10-CM | POA: Insufficient documentation

## 2017-10-08 LAB — CBC WITH DIFFERENTIAL/PLATELET
Basophils Absolute: 0 10*3/uL (ref 0–0.1)
Basophils Relative: 0 %
EOS ABS: 0.1 10*3/uL (ref 0–0.7)
EOS PCT: 1 %
HCT: 40.3 % (ref 35.0–47.0)
Hemoglobin: 13.9 g/dL (ref 12.0–16.0)
LYMPHS ABS: 1.1 10*3/uL (ref 1.0–3.6)
Lymphocytes Relative: 16 %
MCH: 31.5 pg (ref 26.0–34.0)
MCHC: 34.5 g/dL (ref 32.0–36.0)
MCV: 91.4 fL (ref 80.0–100.0)
MONO ABS: 0.6 10*3/uL (ref 0.2–0.9)
MONOS PCT: 9 %
Neutro Abs: 5 10*3/uL (ref 1.4–6.5)
Neutrophils Relative %: 74 %
PLATELETS: 160 10*3/uL (ref 150–440)
RBC: 4.41 MIL/uL (ref 3.80–5.20)
RDW: 15.1 % — ABNORMAL HIGH (ref 11.5–14.5)
WBC: 6.7 10*3/uL (ref 3.6–11.0)

## 2017-10-08 LAB — COMPREHENSIVE METABOLIC PANEL
ALK PHOS: 44 U/L (ref 38–126)
ALT: 15 U/L (ref 0–44)
ANION GAP: 8 (ref 5–15)
AST: 25 U/L (ref 15–41)
Albumin: 3.8 g/dL (ref 3.5–5.0)
BUN: 16 mg/dL (ref 8–23)
CALCIUM: 9 mg/dL (ref 8.9–10.3)
CHLORIDE: 105 mmol/L (ref 98–111)
CO2: 31 mmol/L (ref 22–32)
Creatinine, Ser: 1.22 mg/dL — ABNORMAL HIGH (ref 0.44–1.00)
GFR, EST AFRICAN AMERICAN: 49 mL/min — AB (ref >60)
GFR, EST NON AFRICAN AMERICAN: 42 mL/min — AB (ref >60)
Glucose, Bld: 134 mg/dL — ABNORMAL HIGH (ref 70–99)
Potassium: 3.3 mmol/L — ABNORMAL LOW (ref 3.5–5.1)
SODIUM: 144 mmol/L (ref 135–145)
TOTAL PROTEIN: 6.7 g/dL (ref 6.5–8.1)
Total Bilirubin: 0.6 mg/dL (ref 0.3–1.2)

## 2017-10-08 LAB — URINALYSIS, COMPLETE (UACMP) WITH MICROSCOPIC
Bilirubin Urine: NEGATIVE
GLUCOSE, UA: NEGATIVE mg/dL
Hgb urine dipstick: NEGATIVE
Ketones, ur: NEGATIVE mg/dL
Leukocytes, UA: NEGATIVE
NITRITE: NEGATIVE
PH: 7 (ref 5.0–8.0)
Protein, ur: NEGATIVE mg/dL
Specific Gravity, Urine: 1.008 (ref 1.005–1.030)

## 2017-10-08 LAB — TSH: TSH: 1.378 u[IU]/mL (ref 0.350–4.500)

## 2017-10-08 LAB — TROPONIN I

## 2017-10-08 NOTE — ED Notes (Signed)
Pt ambulatory to and from bathroom with steady gait

## 2017-10-08 NOTE — ED Provider Notes (Signed)
[facility] Emergency Department Provider Note ____________________________________________   First MD Initiated Contact with Patient 10/08/17 1822     (approximate)  I have reviewed the triage vital signs and the nursing notes.   HISTORY  Chief Complaint Shortness of Breath  HPI Maria Horton is a 77 y.o. female with a history of fibromyalgia, constipation as well as stroke who is presenting to the emergency department today with several months of on and off weakness.  She says that she feels weak and often short of breath whenever she gets up to move.  She is denying any pain except in her left wrist which she says is intermittent and dependent on movement of the wrist specifically.  She says that she is coming today because of her cardiologist advising her to seek treatment in the emergency department if she had any further episodes of the symptoms.   Past Medical History:  Diagnosis Date  . Anxiety   . Chronic back pain   . Constipation   . Depression   . Diverticulosis   . Fibromyalgia   . Gastroparesis   . GERD (gastroesophageal reflux disease)   . Hypertension   . Hypoglycemia   . Hypokalemia   . Osteoarthritis   . PONV (postoperative nausea and vomiting)   . Reflux   . Stroke (HCC)   . TIA (transient ischemic attack)   . Vascular dementia   . Vitamin D deficiency     Patient Active Problem List   Diagnosis Date Noted  . Sick sinus syndrome (HCC) 12/14/2016  . Fall at home, initial encounter 11/14/2016  . TIA (transient ischemic attack) 09/10/2016  . Chronic pain syndrome 09/30/2015  . GERD (gastroesophageal reflux disease) 09/29/2015  . Severe recurrent major depression without psychotic features (HCC) 09/26/2015  . Fibromyalgia 09/26/2015  . Osteoarthritis 09/26/2015  . Hypertension 09/26/2015    Past Surgical History:  Procedure Laterality Date  . ABDOMINAL HYSTERECTOMY    . ABDOMINOPLASTY    . BREAST BIOPSY Left 2005   bengin, with clip  . BUNIONECTOMY Bilateral   . CHOLECYSTECTOMY    . COLON SURGERY    . NECK SURGERY    . PACEMAKER INSERTION N/A 12/14/2016   Procedure: INSERTION PACEMAKER;  Surgeon: Marcina Millard, MD;  Location: [facility] ORS;  Service: Cardiovascular;  Laterality: N/A;  . ROTATOR CUFF REPAIR    . TONSILLECTOMY      Prior to Admission medications   Medication Sig Start Date End Date Taking? Authorizing Provider  aspirin EC 81 MG EC tablet Take 1 tablet (81 mg total) by mouth daily. 11/18/16   Auburn Bilberry, MD  b complex vitamins tablet Take 1 tablet daily by mouth.    [provider]  cephALEXin (KEFLEX) 250 MG capsule Take 1 capsule (250 mg total) by mouth 4 (four) times daily. 12/15/16   Leanora Ivanoff, PA-C  citalopram (CELEXA) 40 MG tablet Take 40 mg daily by mouth.     [provider]  clonazePAM (KLONOPIN) 0.5 MG tablet Take 1.5 tablets (0.75 mg total) by mouth at bedtime. Patient taking differently: Take 0.5 mg at bedtime by mouth.  10/01/15   Pucilowska, Jolanta B, MD  DULoxetine (CYMBALTA) 20 MG capsule Take 20 mg daily by mouth.    [provider]  gabapentin (NEURONTIN) 100 MG capsule Take 100 mg by mouth 2 (two) times daily. 09/02/16   [provider]  hydrOXYzine (VISTARIL) 25 MG capsule Take 25 mg at bedtime as needed by mouth  for anxiety.     [provider]  meloxicam (MOBIC) 15 MG tablet Take 15 mg by mouth daily.    [provider]  pravastatin (PRAVACHOL) 40 MG tablet Take 1 tablet (40 mg total) by mouth every evening. Patient taking differently: Take 20 mg every evening by mouth.  11/17/16   Auburn Bilberry, MD  rOPINIRole (REQUIP) 0.5 MG tablet Take 0.5 mg at bedtime by mouth.     [provider]  traZODone (DESYREL) 100 MG tablet Take 100 mg at bedtime by mouth.     [provider]  triamterene-hydrochlorothiazide (DYAZIDE) 37.5-25 MG capsule Take 1 capsule daily by mouth.    [provider]    Allergies Ace inhibitors; Beta adrenergic blockers; Calcium channel blockers; Latex; and Tape  Family History  Problem Relation Age of Onset  . Hypertension Mother   . Breast cancer Sister 64    Social History Social History   Tobacco Use  . Smoking status: Never Smoker  . Smokeless tobacco: Never Used  Substance Use Topics  . Alcohol use: No  . Drug use: No    Review of Systems  Constitutional: No fever/chills Eyes: No visual changes. ENT: No sore throat. Cardiovascular: Denies chest pain. Respiratory: Denies shortness of breath. Gastrointestinal: No abdominal pain.  No nausea, no vomiting.  No diarrhea.  No constipation. Genitourinary: Negative for dysuria. Musculoskeletal: Negative for back pain. Skin: Negative for rash. Neurological: Negative for headaches, focal weakness or numbness.   ____________________________________________   PHYSICAL EXAM:  VITAL SIGNS: ED Triage Vitals  Enc Vitals Group     BP 10/08/17 1901 (!) 110/55     Pulse Rate 10/08/17 1901 (!) 51     Resp 10/08/17 1901 15     Temp 10/08/17 1841 (P) 98.3 F (36.8 C)     Temp Source 10/08/17 1841 (P) Oral     SpO2 10/08/17 1901 93 %     Weight 10/08/17 1836 138 lb 14.2 oz (63 kg)     Height 10/08/17 1836 5\' 4"  (1.626 m)     Head Circumference --      Peak Flow --      Pain Score --      Pain Loc --      Pain Edu? --      Excl. in GC? --     Constitutional: Alert and oriented. Well appearing and in no acute distress. Eyes: Conjunctivae are normal.  Head: Atraumatic. Nose: No congestion/rhinnorhea. Mouth/Throat: Mucous membranes are moist.  Neck: No stridor.   Cardiovascular: Normal rate, regular rhythm. Grossly normal heart sounds Respiratory: Normal respiratory effort.  No retractions. Lungs CTAB. Gastrointestinal: Soft and nontender. No distention.  Musculoskeletal: No lower extremity tenderness nor edema.  No joint effusions. Neurologic:  Normal speech and  language. No gross focal neurologic deficits are appreciated. Skin:  Skin is warm, dry and intact. No rash noted. Psychiatric: Mood and affect are normal. Speech and behavior are normal.  ____________________________________________   LABS (all labs ordered are listed, but only abnormal results are displayed)  Labs Reviewed  CBC WITH DIFFERENTIAL/PLATELET - Abnormal; Notable for the following components:      Result Value   RDW 15.1 (*)    All other components within normal limits  COMPREHENSIVE METABOLIC PANEL - Abnormal; Notable for the following components:   Potassium 3.3 (*)    Glucose, Bld 134 (*)    Creatinine, Ser 1.22 (*)    GFR calc non Af Amer 42 (*)  GFR calc Af Amer 49 (*)    All other components within normal limits  URINALYSIS, COMPLETE (UACMP) WITH MICROSCOPIC - Abnormal; Notable for the following components:   Color, Urine YELLOW (*)    APPearance CLEAR (*)    Bacteria, UA RARE (*)    All other components within normal limits  TROPONIN I  TSH   ____________________________________________  EKG  ED ECG REPORT I, Arelia Longestavid M Providence Stivers, the attending physician, personally viewed and interpreted this ECG.   Date: 10/08/2017  EKG Time: 1900  Rate: 51  Rhythm: Atrial paced rhythm  Axis: Normal  Intervals:none  ST&T Change: No ST segment elevation or depression.  No abnormal T wave inversion.  ____________________________________________  RADIOLOGY  No acute finding on CT the brain nor the chest x-ray. ____________________________________________   PROCEDURES  Procedure(s) performed:   Procedures  Critical Care performed:   ____________________________________________   INITIAL IMPRESSION / ASSESSMENT AND PLAN / ED COURSE  Pertinent labs & imaging results that were available during my care of the patient were reviewed by me and considered in my medical decision making (see chart for details).  DDX: Bradycardia, CVA, renal failure, less light  abnormality, UTI, pneumonia As part of my medical decision making, I reviewed the following data within the electronic MEDICAL RECORD NUMBER Notes from prior ED visits  ----------------------------------------- 8:35 PM on 10/08/2017 -----------------------------------------  Patient at this time without significant orthostatic changes in her vital signs.  Pacemaker appears to be set to 50.  Patient up and walking around the room right now without issue.  I do not believe there is an emergency medical condition at this time necessitating admission to the hospital.  Says that she has a follow-up appointment this week with her cardiologist, Dr. Darrold JunkerParaschos.  We discussed the lab as well as imaging results.  Patient to be discharged at this time. ____________________________________________   FINAL CLINICAL IMPRESSION(S) / ED DIAGNOSES  Shortness of breath.  Generalized weakness.  NEW MEDICATIONS STARTED DURING THIS VISIT:  New Prescriptions   No medications on file     Note:  This document was prepared using Dragon voice recognition software and may include unintentional dictation errors.     Myrna BlazerSchaevitz, Tavi Hoogendoorn Matthew, MD 10/08/17 2035

## 2017-10-26 ENCOUNTER — Ambulatory Visit
Admission: RE | Admit: 2017-10-26 | Discharge: 2017-10-26 | Disposition: A | Discharge status: Home / Self Care | Payer: Medicare Other | Source: Ambulatory Visit | Attending: Cardiology | Admitting: Cardiology

## 2017-10-26 ENCOUNTER — Encounter
Admission: RE | Disposition: A | Discharge status: Home / Self Care | Payer: Self-pay | Source: Ambulatory Visit | Attending: Cardiology

## 2017-10-26 DIAGNOSIS — Z7902 Long term (current) use of antithrombotics/antiplatelets: Secondary | ICD-10-CM | POA: Diagnosis not present

## 2017-10-26 DIAGNOSIS — Z7982 Long term (current) use of aspirin: Secondary | ICD-10-CM | POA: Insufficient documentation

## 2017-10-26 DIAGNOSIS — Z950 Presence of cardiac pacemaker: Secondary | ICD-10-CM | POA: Diagnosis not present

## 2017-10-26 DIAGNOSIS — E782 Mixed hyperlipidemia: Secondary | ICD-10-CM | POA: Insufficient documentation

## 2017-10-26 DIAGNOSIS — F419 Anxiety disorder, unspecified: Secondary | ICD-10-CM | POA: Insufficient documentation

## 2017-10-26 DIAGNOSIS — Z8673 Personal history of transient ischemic attack (TIA), and cerebral infarction without residual deficits: Secondary | ICD-10-CM | POA: Insufficient documentation

## 2017-10-26 DIAGNOSIS — I7090 Unspecified atherosclerosis: Secondary | ICD-10-CM | POA: Insufficient documentation

## 2017-10-26 DIAGNOSIS — I10 Essential (primary) hypertension: Secondary | ICD-10-CM | POA: Insufficient documentation

## 2017-10-26 DIAGNOSIS — M1990 Unspecified osteoarthritis, unspecified site: Secondary | ICD-10-CM | POA: Insufficient documentation

## 2017-10-26 DIAGNOSIS — F0150 Vascular dementia without behavioral disturbance: Secondary | ICD-10-CM | POA: Insufficient documentation

## 2017-10-26 DIAGNOSIS — Z79899 Other long term (current) drug therapy: Secondary | ICD-10-CM | POA: Insufficient documentation

## 2017-10-26 DIAGNOSIS — I2510 Atherosclerotic heart disease of native coronary artery without angina pectoris: Secondary | ICD-10-CM | POA: Insufficient documentation

## 2017-10-26 DIAGNOSIS — I495 Sick sinus syndrome: Secondary | ICD-10-CM | POA: Insufficient documentation

## 2017-10-26 DIAGNOSIS — Z9049 Acquired absence of other specified parts of digestive tract: Secondary | ICD-10-CM | POA: Insufficient documentation

## 2017-10-26 DIAGNOSIS — M797 Fibromyalgia: Secondary | ICD-10-CM | POA: Diagnosis not present

## 2017-10-26 DIAGNOSIS — R079 Chest pain, unspecified: Secondary | ICD-10-CM | POA: Diagnosis present

## 2017-10-26 DIAGNOSIS — K3184 Gastroparesis: Secondary | ICD-10-CM | POA: Insufficient documentation

## 2017-10-26 HISTORY — PX: LEFT HEART CATH AND CORONARY ANGIOGRAPHY: CATH118249

## 2017-10-26 SURGERY — LEFT HEART CATH AND CORONARY ANGIOGRAPHY
Anesthesia: Moderate Sedation | Laterality: Left

## 2017-10-26 MED ORDER — FENTANYL CITRATE (PF) 100 MCG/2ML IJ SOLN
INTRAMUSCULAR | Status: AC
  Filled 2017-10-26: qty 2

## 2017-10-26 MED ORDER — IOPAMIDOL (ISOVUE-300) INJECTION 61%
INTRAVENOUS | Status: DC | PRN
  Administered 2017-10-26: 95 mL via INTRA_ARTERIAL

## 2017-10-26 MED ORDER — MIDAZOLAM HCL 2 MG/2ML IJ SOLN
INTRAMUSCULAR | Status: AC
  Filled 2017-10-26: qty 2

## 2017-10-26 MED ORDER — MIDAZOLAM HCL 2 MG/2ML IJ SOLN
INTRAMUSCULAR | Status: DC | PRN
  Administered 2017-10-26: 1 mg via INTRAVENOUS

## 2017-10-26 MED ORDER — SODIUM CHLORIDE 0.9 % WEIGHT BASED INFUSION: 1.0000 mL/kg/h | INTRAVENOUS | Status: DC

## 2017-10-26 MED ORDER — HEPARIN (PORCINE) IN NACL 1000-0.9 UT/500ML-% IV SOLN
INTRAVENOUS | Status: AC
  Filled 2017-10-26: qty 1000

## 2017-10-26 MED ORDER — FENTANYL CITRATE (PF) 100 MCG/2ML IJ SOLN
INTRAMUSCULAR | Status: DC | PRN
  Administered 2017-10-26: 25 ug via INTRAVENOUS

## 2017-10-26 MED ORDER — ASPIRIN 81 MG PO CHEW
CHEWABLE_TABLET | ORAL | Status: AC
  Administered 2017-10-26: 81 mg via ORAL
  Filled 2017-10-26: qty 1

## 2017-10-26 MED ORDER — SODIUM CHLORIDE 0.9% FLUSH: 3.0000 mL | INTRAVENOUS | Status: DC | PRN

## 2017-10-26 MED ORDER — ONDANSETRON HCL 4 MG/2ML IJ SOLN: 4.0000 mg | Freq: Four times a day (QID) | INTRAMUSCULAR | Status: DC | PRN

## 2017-10-26 MED ORDER — SODIUM CHLORIDE 0.9 % WEIGHT BASED INFUSION
3.0000 mL/kg/h | INTRAVENOUS | Status: AC
  Administered 2017-10-26: 3 mL/kg/h via INTRAVENOUS

## 2017-10-26 MED ORDER — SODIUM CHLORIDE 0.9% FLUSH: 3.0000 mL | Freq: Two times a day (BID) | INTRAVENOUS | Status: DC

## 2017-10-26 MED ORDER — ACETAMINOPHEN 325 MG PO TABS: 650.0000 mg | ORAL_TABLET | ORAL | Status: DC | PRN

## 2017-10-26 MED ORDER — SODIUM CHLORIDE 0.9 % IV SOLN: 250.0000 mL | INTRAVENOUS | Status: DC | PRN

## 2017-10-26 MED ORDER — ASPIRIN 81 MG PO CHEW
81.0000 mg | CHEWABLE_TABLET | ORAL | Status: AC
  Administered 2017-10-26: 81 mg via ORAL

## 2017-10-26 SURGICAL SUPPLY — 9 items
CATH INFINITI 5FR ANG PIGTAIL (CATHETERS) ×3 IMPLANT
CATH INFINITI 5FR JL4 (CATHETERS) ×3 IMPLANT
CATH INFINITI JR4 5F (CATHETERS) ×3 IMPLANT
DEVICE CLOSURE MYNXGRIP 5F (Vascular Products) ×3 IMPLANT
KIT MANI 3VAL PERCEP (MISCELLANEOUS) ×3 IMPLANT
NEEDLE PERC 18GX7CM (NEEDLE) ×3 IMPLANT
PACK CARDIAC CATH (CUSTOM PROCEDURE TRAY) ×3 IMPLANT
SHEATH AVANTI 5FR X 11CM (SHEATH) ×3 IMPLANT
WIRE GUIDERIGHT .035X150 (WIRE) ×3 IMPLANT

## 2017-11-24 ENCOUNTER — Other Ambulatory Visit: Payer: Self-pay | Admitting: Pulmonary Disease

## 2017-11-24 DIAGNOSIS — R0609 Other forms of dyspnea: Principal | ICD-10-CM

## 2017-11-28 ENCOUNTER — Other Ambulatory Visit: Payer: Self-pay | Admitting: Pulmonary Disease

## 2017-11-28 DIAGNOSIS — J849 Interstitial pulmonary disease, unspecified: Secondary | ICD-10-CM

## 2017-11-29 ENCOUNTER — Ambulatory Visit
Admission: RE | Admit: 2017-11-29 | Discharge: 2017-11-29 | Disposition: A | Discharge status: Home / Self Care | Payer: Medicare Other | Source: Ambulatory Visit | Attending: Pulmonary Disease | Admitting: Pulmonary Disease

## 2017-11-29 DIAGNOSIS — R0609 Other forms of dyspnea: Secondary | ICD-10-CM | POA: Insufficient documentation

## 2017-11-29 DIAGNOSIS — J986 Disorders of diaphragm: Secondary | ICD-10-CM | POA: Diagnosis not present

## 2017-11-29 DIAGNOSIS — J849 Interstitial pulmonary disease, unspecified: Secondary | ICD-10-CM | POA: Diagnosis not present

## 2017-11-30 ENCOUNTER — Ambulatory Visit: Payer: Medicare Other | Attending: Neurology

## 2017-11-30 DIAGNOSIS — G4761 Periodic limb movement disorder: Secondary | ICD-10-CM | POA: Diagnosis present

## 2017-11-30 DIAGNOSIS — G4733 Obstructive sleep apnea (adult) (pediatric): Secondary | ICD-10-CM | POA: Insufficient documentation

## 2017-12-08 ENCOUNTER — Other Ambulatory Visit: Payer: Self-pay | Admitting: Pulmonary Disease

## 2017-12-08 ENCOUNTER — Ambulatory Visit
Admission: RE | Admit: 2017-12-08 | Discharge: 2017-12-08 | Disposition: A | Discharge status: Home / Self Care | Payer: Medicare Other | Source: Ambulatory Visit | Attending: Pulmonary Disease | Admitting: Pulmonary Disease

## 2017-12-08 DIAGNOSIS — I2510 Atherosclerotic heart disease of native coronary artery without angina pectoris: Secondary | ICD-10-CM | POA: Diagnosis not present

## 2017-12-08 DIAGNOSIS — R911 Solitary pulmonary nodule: Secondary | ICD-10-CM | POA: Diagnosis not present

## 2017-12-08 DIAGNOSIS — J849 Interstitial pulmonary disease, unspecified: Secondary | ICD-10-CM

## 2017-12-08 DIAGNOSIS — N200 Calculus of kidney: Secondary | ICD-10-CM | POA: Diagnosis not present

## 2017-12-08 DIAGNOSIS — I700 Atherosclerosis of aorta: Secondary | ICD-10-CM | POA: Diagnosis not present

## 2017-12-08 DIAGNOSIS — J479 Bronchiectasis, uncomplicated: Secondary | ICD-10-CM | POA: Insufficient documentation

## 2017-12-13 ENCOUNTER — Other Ambulatory Visit: Payer: Self-pay | Admitting: Pulmonary Disease

## 2017-12-13 DIAGNOSIS — K219 Gastro-esophageal reflux disease without esophagitis: Secondary | ICD-10-CM

## 2017-12-19 ENCOUNTER — Ambulatory Visit
Admission: RE | Admit: 2017-12-19 | Discharge: 2017-12-19 | Disposition: A | Discharge status: Home / Self Care | Payer: Medicare Other | Source: Ambulatory Visit | Attending: Pulmonary Disease | Admitting: Pulmonary Disease

## 2017-12-19 DIAGNOSIS — K219 Gastro-esophageal reflux disease without esophagitis: Secondary | ICD-10-CM | POA: Insufficient documentation

## 2018-01-11 ENCOUNTER — Encounter: Payer: Self-pay | Admitting: *Deleted

## 2018-01-11 ENCOUNTER — Encounter
Admission: RE | Disposition: A | Discharge status: Home / Self Care | Payer: Self-pay | Source: Home / Self Care | Attending: Pulmonary Disease

## 2018-01-11 ENCOUNTER — Ambulatory Visit
Admission: RE | Admit: 2018-01-11 | Discharge: 2018-01-11 | Disposition: A | Discharge status: Home / Self Care | Payer: Medicare Other | Attending: Pulmonary Disease | Admitting: Pulmonary Disease

## 2018-01-11 ENCOUNTER — Other Ambulatory Visit: Payer: Self-pay

## 2018-01-11 DIAGNOSIS — G8929 Other chronic pain: Secondary | ICD-10-CM | POA: Diagnosis not present

## 2018-01-11 DIAGNOSIS — M549 Dorsalgia, unspecified: Secondary | ICD-10-CM | POA: Diagnosis not present

## 2018-01-11 DIAGNOSIS — E162 Hypoglycemia, unspecified: Secondary | ICD-10-CM | POA: Insufficient documentation

## 2018-01-11 DIAGNOSIS — J479 Bronchiectasis, uncomplicated: Secondary | ICD-10-CM | POA: Diagnosis not present

## 2018-01-11 DIAGNOSIS — K219 Gastro-esophageal reflux disease without esophagitis: Secondary | ICD-10-CM | POA: Diagnosis not present

## 2018-01-11 DIAGNOSIS — I10 Essential (primary) hypertension: Secondary | ICD-10-CM | POA: Insufficient documentation

## 2018-01-11 DIAGNOSIS — Z90710 Acquired absence of both cervix and uterus: Secondary | ICD-10-CM | POA: Diagnosis not present

## 2018-01-11 DIAGNOSIS — Z8249 Family history of ischemic heart disease and other diseases of the circulatory system: Secondary | ICD-10-CM | POA: Insufficient documentation

## 2018-01-11 DIAGNOSIS — K3184 Gastroparesis: Secondary | ICD-10-CM | POA: Diagnosis not present

## 2018-01-11 DIAGNOSIS — F0150 Vascular dementia without behavioral disturbance: Secondary | ICD-10-CM | POA: Diagnosis not present

## 2018-01-11 DIAGNOSIS — Z803 Family history of malignant neoplasm of breast: Secondary | ICD-10-CM | POA: Diagnosis not present

## 2018-01-11 DIAGNOSIS — F419 Anxiety disorder, unspecified: Secondary | ICD-10-CM | POA: Diagnosis not present

## 2018-01-11 DIAGNOSIS — F329 Major depressive disorder, single episode, unspecified: Secondary | ICD-10-CM | POA: Insufficient documentation

## 2018-01-11 DIAGNOSIS — E559 Vitamin D deficiency, unspecified: Secondary | ICD-10-CM | POA: Diagnosis not present

## 2018-01-11 DIAGNOSIS — E876 Hypokalemia: Secondary | ICD-10-CM | POA: Insufficient documentation

## 2018-01-11 DIAGNOSIS — Z950 Presence of cardiac pacemaker: Secondary | ICD-10-CM | POA: Diagnosis not present

## 2018-01-11 DIAGNOSIS — Z8673 Personal history of transient ischemic attack (TIA), and cerebral infarction without residual deficits: Secondary | ICD-10-CM | POA: Diagnosis not present

## 2018-01-11 DIAGNOSIS — M797 Fibromyalgia: Secondary | ICD-10-CM | POA: Insufficient documentation

## 2018-01-11 DIAGNOSIS — R0600 Dyspnea, unspecified: Secondary | ICD-10-CM | POA: Insufficient documentation

## 2018-01-11 HISTORY — PX: FLEXIBLE BRONCHOSCOPY: SHX5094

## 2018-01-11 LAB — KOH PREP: KOH Prep: NONE SEEN

## 2018-01-11 SURGERY — BRONCHOSCOPY, FLEXIBLE
Anesthesia: Moderate Sedation | Laterality: Bilateral

## 2018-01-11 MED ORDER — PHENYLEPHRINE HCL 0.25 % NA SOLN
1.0000 | Freq: Four times a day (QID) | NASAL | Status: DC | PRN
  Filled 2018-01-11: qty 15

## 2018-01-11 MED ORDER — MIDAZOLAM HCL 2 MG/2ML IJ SOLN
INTRAMUSCULAR | Status: AC
  Filled 2018-01-11: qty 8

## 2018-01-11 MED ORDER — MIDAZOLAM HCL 2 MG/2ML IJ SOLN
INTRAMUSCULAR | Status: DC | PRN
  Administered 2018-01-11: 2 mg via INTRAVENOUS
  Administered 2018-01-11 (×3): 1 mg via INTRAVENOUS
  Administered 2018-01-11: 2 mg via INTRAVENOUS
  Administered 2018-01-11: 1 mg via INTRAVENOUS

## 2018-01-11 MED ORDER — FENTANYL CITRATE (PF) 100 MCG/2ML IJ SOLN
INTRAMUSCULAR | Status: DC | PRN
  Administered 2018-01-11 (×3): 25 ug via INTRAVENOUS

## 2018-01-11 MED ORDER — LIDOCAINE HCL URETHRAL/MUCOSAL 2 % EX GEL
1.0000 "application " | Freq: Once | CUTANEOUS | Status: DC
  Filled 2018-01-11: qty 30

## 2018-01-11 MED ORDER — FENTANYL CITRATE (PF) 100 MCG/2ML IJ SOLN
INTRAMUSCULAR | Status: AC
  Filled 2018-01-11: qty 4

## 2018-01-11 MED ORDER — BUTAMBEN-TETRACAINE-BENZOCAINE 2-2-14 % EX AERO
1.0000 | INHALATION_SPRAY | CUTANEOUS | Status: DC | PRN
  Filled 2018-01-11: qty 20

## 2018-01-11 NOTE — H&P (Signed)
Pulmonary Medicine H&P         Date: 01/11/2018,   MRN# [id number] Maria Horton 03/18/1940 Code Status:  Code Status History    Date Active Date Inactive Code Status Order ID Comments User Context   10/26/2017 0825 10/26/2017 1220 Full Code [id number]  Isaias Cowman, MD Inpatient   12/14/2016 1543 12/15/2016 1728 Full Code [id number]  Isaias Cowman, MD Inpatient   11/14/2016 2145 11/17/2016 2020 Full Code [id number]  Vaughan Basta, MD ED   09/10/2016 1617 09/11/2016 1850 Full Code [id number]  Dustin Flock, MD Inpatient   09/26/2015 2050 10/01/2015 1857 Full Code [id number]  Clapacs, Madie Reno, MD Inpatient         AdmissionWeight: 70.8 kg                 CurrentWeight: 70.8 kg Maria Horton is a 77 y.o. old female seen in clinic for DOE, with bronchiectasis, concern for indolent infectious etiology such as MAI, plan for bronchoscopy with BAL with subsequent eval of specimens for micro & cyto    CHIEF COMPLAINT:   DOE   HISTORY OF PRESENT ILLNESS   As per [facility] clinic initial visit. Maria Horton is 77 y.o. female who was referred to [facility] Pulmonary clinic due to chronic DOE. She has PMH as outlined below. Patient is a lifelong nonsmoker. She complains of daily shortness of breath. Currently not able to walk even 1 block on level grade due to SOB and is only able to climb 3 steps before having to stop for breathing. She states despite having having arthritis and stroke in past it is mainly her breathing that hinders her activity. She also complains of cough, she states this is going on for close to a year and is worse at night and when sleeping. She denies PND. She denies hemoptysis ever. She does endorse expectorating dark phlegm but infrequently. She denies having allergies to anything in particular and does not take OTC meds for seasonal allergies. When asking about reflux esophagitis she states " I have severe heartburn", She was on nexium in the past but  states it is too expensive and she stopped it. She was prescribed zantac but states she is unsure if she takes it she does take tums when she can longer tolerate the burning sensation. She recently went to ED last month with for chest discomfort and pain was seen by cardiology which ruled out cardiac etiology. When asking about back pain she states she has some kind of arthitis of her back that keeps her in bed for a while and takes some time to get up. When asking about her fibromyalgia she reports joint pains in "hips, legs, arms". She takes celexa, cymbalta,klonopin to help remedy anxiety/depression/fibromyalgia which she states help her. After CVAx2 in 2018 she admits to decreased strength but has not specifically noted chocking on food. She has recently gained weight, she admits to night sweats, denies chills. She reports both bowel and urinary incontinence which is "often and alot" and uses a pad now because its unpredictable. She reports falling down many times and now wears a fall risk bracelet.     PAST MEDICAL HISTORY   Past Medical History:  Diagnosis Date  . Anxiety   . Chronic back pain   . Constipation   . Depression   . Diverticulosis   . Fibromyalgia   . Gastroparesis   . GERD (gastroesophageal reflux disease)   . Hypertension   . Hypoglycemia   .  Hypokalemia   . Osteoarthritis   . PONV (postoperative nausea and vomiting)   . Reflux   . Stroke ([place])   . TIA (transient ischemic attack)   . Vascular dementia ([place])   . Vitamin D deficiency      SURGICAL HISTORY   Past Surgical History:  Procedure Laterality Date  . ABDOMINAL HYSTERECTOMY    . ABDOMINOPLASTY    . BREAST BIOPSY Left 2005   bengin, with clip  . BUNIONECTOMY Bilateral   . CHOLECYSTECTOMY    . COLON SURGERY    . LEFT HEART CATH AND CORONARY ANGIOGRAPHY Left 10/26/2017   Procedure: LEFT HEART CATH AND CORONARY ANGIOGRAPHY;  Surgeon: Isaias Cowman, MD;  Location: [place] [facility];  Service:  Cardiovascular;  Laterality: Left;  . NECK SURGERY    . PACEMAKER INSERTION N/A 12/14/2016   Procedure: INSERTION PACEMAKER;  Surgeon: Isaias Cowman, MD;  Location: [facility] ORS;  Service: Cardiovascular;  Laterality: N/A;  . ROTATOR CUFF REPAIR    . TONSILLECTOMY       FAMILY HISTORY   Family History  Problem Relation Age of Onset  . Hypertension Mother   . Breast cancer Sister 64     SOCIAL HISTORY   Social History   Tobacco Use  . Smoking status: Never Smoker  . Smokeless tobacco: Never Used  Substance Use Topics  . Alcohol use: No  . Drug use: No     MEDICATIONS    Home Medication:    Current Medication: No current facility-administered medications for this encounter.     ALLERGIES   5-alpha reductase inhibitors; Ace inhibitors; Beta adrenergic blockers; Calcium channel blockers; Latex; and Tape     REVIEW OF SYSTEMS    Review of Systems:  Gen:  Denies  fever, sweats, chills weigh loss  HEENT: Denies blurred vision, double vision, ear pain, eye pain, hearing loss, nose bleeds, sore throat Cardiac:  No dizziness, chest pain or heaviness, chest tightness,edema Resp:   Denies cough or sputum porduction, shortness of breath,wheezing, hemoptysis,  Gi: Denies swallowing difficulty, stomach pain, nausea or vomiting, diarrhea, constipation, bowel incontinence Gu:  Denies bladder incontinence, burning urine Ext:   Denies Joint pain, stiffness or swelling Skin: Denies  skin rash, easy bruising or bleeding or hives Endoc:  Denies polyuria, polydipsia , polyphagia or weight change Psych:   Denies depression, insomnia or hallucinations   Other:  All other systems negative   VS: BP 128/62   Pulse (!) 55   Temp 98 F (36.7 C) (Oral)   Resp 17   Ht _0  (1.626 m)   Wt 70.8 kg   SpO2 96%   BMI 26.78 kg/m      PHYSICAL EXAM  Physical Examination:   GENERAL:NAD, no fevers, chills, no weakness no fatigue HEAD: Normocephalic, atraumatic.  EYES:  Pupils equal, round, reactive to light. Extraocular muscles intact. No scleral icterus.  MOUTH: Moist mucosal membrane. Dentition intact. No abscess noted.  EAR, NOSE, THROAT: Clear without exudates. No external lesions.  NECK: Supple. No thyromegaly. No nodules. No JVD.  PULMONARY: Diffuse coarse rhonchi right sided +wheezes CARDIOVASCULAR: S1 and S2. Regular rate and rhythm. No murmurs, rubs, or gallops. No edema. Pedal pulses 2+ bilaterally.  GASTROINTESTINAL: Soft, nontender, nondistended. No masses. Positive bowel sounds. No hepatosplenomegaly.  MUSCULOSKELETAL: No swelling, clubbing, or edema. Range of motion full in all extremities.  NEUROLOGIC: Cranial nerves II through XII are intact. No gross focal neurological deficits. Sensation intact. Reflexes intact.  SKIN: No ulceration,  lesions, rashes, or cyanosis. Skin warm and dry. Turgor intact.  PSYCHIATRIC: Mood, affect within normal limits. The patient is awake, alert and oriented x 3. Insight, judgment intact.       IMAGING    Dg Ugi W/o Kub  Result Date: 12/19/2017 CLINICAL DATA:  Severe gastroesophageal reflux EXAM: UPPER GI SERIES WITHOUT KUB TECHNIQUE: Routine upper GI series was performed with thin/high density barium. Effervescent crystals and a barium tablet were administered. FLUOROSCOPY TIME:  Fluoroscopy Time:  1 minutes, 12 seconds Radiation Exposure Index (if provided by the fluoroscopic device): 22.3 mGy Number of Acquired Spot Images: 12 COMPARISON:  None. FINDINGS: The patient ingested thick and thin barium and the gas-forming crystals without difficulty. The thoracic esophagus distended in a normal fashion. A small reducible hiatal hernia was observed. A large amount of gastroesophageal reflux occurred spontaneously and with patient position changes. There was no fixed stricture nor evidence of esophagitis. The barium tablet passed promptly from the mouth to the stomach. The stomach distended well. Gastric emptying was  prompt. The mucosal fold pattern was normal. A few small polyps were observed. These measure less than 1 cm in size. No ulcer niche was observed. The duodenal bulb and C sweep were normal in appearance. IMPRESSION: Large amount of gastroesophageal reflux to the level of the thoracic inlet. No evidence of stricture nor esophagitis. Small reducible hiatal hernia. Scattered subcentimeter gastric polyps. No evidence of ulceration. Prompt gastric emptying. Normal appearance of the duodenum. Electronically Signed   By: David  Martinique M.D.   On: 12/19/2017 11:47    Maria Horton is 77 y.o. female who was referred to [facility] Pulmonary clinic due to chronic DOE. She has PMH as outlined below. Patient is a lifelong nonsmoker. She complains of daily shortness of breath. Currently not able to walk even 1 block on level grade due to SOB and is only able to climb 3 steps before having to stop for breathing. She states despite having having arthritis and stroke in past it is mainly her breathing that hinders her activity. She also complains of cough, she states this is going on for close to a year and is worse at night and when sleeping. She denies PND. She denies hemoptysis ever. She does endorse expectorating dark phlegm but infrequently. She denies having allergies to anything in particular and does not take OTC meds for seasonal allergies. When asking about reflux esophagitis she states " I have severe heartburn", She was on nexium in the past but states it is too expensive and she stopped it. She was prescribed zantac but states she is unsure if she takes it she does take tums when she can longer tolerate the burning sensation. She recently went to ED last month with for chest discomfort and pain was seen by cardiology which ruled out cardiac etiology. When asking about back pain she states she has some kind of arthitis of her back that keeps her in bed for a while and takes some time to get up. When asking about her  fibromyalgia she reports joint pains in "hips, legs, arms". She takes celexa, cymbalta,klonopin to help remedy anxiety/depression/fibromyalgia which she states help her. After CVAx2 in 2018 she admits to decreased strength but has not specifically noted chocking on food. She has recently gained weight, she admits to night sweats, denies chills. She reports both bowel and urinary incontinence which is "often and alot" and uses a pad now because its unpredictable. She reports falling down many times and now  wears a fall risk bracelet.    Maria Horton is 77 y.o. female who was referred to [facility] Pulmonary clinic due to chronic DOE. She has PMH as outlined below. Patient is a lifelong nonsmoker. She complains of daily shortness of breath. Currently not able to walk even 1 block on level grade due to SOB and is only able to climb 3 steps before having to stop for breathing. She states despite having having arthritis and stroke in past it is mainly her breathing that hinders her activity. She also complains of cough, she states this is going on for close to a year and is worse at night and when sleeping. She denies PND. She denies hemoptysis ever. She does endorse expectorating dark phlegm but infrequently. She denies having allergies to anything in particular and does not take OTC meds for seasonal allergies. When asking about reflux esophagitis she states " I have severe heartburn", She was on nexium in the past but states it is too expensive and she stopped it. She was prescribed zantac but states she is unsure if she takes it she does take tums when she can longer tolerate the burning sensation. She recently went to ED last month with for chest discomfort and pain was seen by cardiology which ruled out cardiac etiology. When asking about back pain she states she has some kind of arthitis of her back that keeps her in bed for a while and takes some time to get up. When asking about her fibromyalgia she reports  joint pains in "hips, legs, arms". She takes celexa, cymbalta,klonopin to help remedy anxiety/depression/fibromyalgia which she states help her. After CVAx2 in 2018 she admits to decreased strength but has not specifically noted chocking on food. She has recently gained weight, she admits to night sweats, denies chills. She reports both bowel and urinary incontinence which is "often and alot" and uses a pad now because its unpredictable. She reports falling down many times and now wears a fall risk bracelet.    ASSESSMENT/PLAN   Dyspnea with bronchiectasis - plan for bronchoscopy with bronchoalveolar lavage - concern for MAI/non-tuberculary mycobacterial infection vs fungal etiology - specimens for GMS/PAS/KOH/AFB as well as bacterial and fungal cultures and cytology evaluation    Patient/Family are satisfied with Plan of action and management. All questions answered      Ottie Glazier, MD Division of Pulmonary and Critical Care Medicine 12:43 PM 01/11/2018

## 2018-01-11 NOTE — Procedures (Signed)
FLEXIBLE BRONCHOSCOPY PROCEDURE NOTE    Flexible bronchoscopy was performed on 01/11/18 by : Lanney Gins MD  assistance by : 1)Stacey and 2)Lissa   Indication for the procedure was :  Pre-procedural H&P. The following assessment was performed on the day of the procedure prior to initiating sedation History:  Chest pain none Dyspnea none Hemoptysis none Cough none Fever none Other pertinent items none  Examination Vital signs -reviewed as per nursing documentation today Cardiac    Murmurs: none  Rubs : none  Gallop: none Lungs Wheezing: none Rales : noe Rhonchi {:none  Other pertinent findings: none   Pre-procedural assessment for Procedural Sedation included: Depth of sedation: As per anesthesia team  ASA Classification:  1 Mallampati airway assessment: 2    Medication list reviewed: as per [facility]  The patient's interval history was taken and revealed: no new complaints The pre- procedure physical examination revealed: No new findings Refer to prior clinic note for details.  Informed Consent: Informed consent was obtained from:  patient after explanation of procedure and risks, benefits, as well as alternative procedures available.  Explanation of level of sedation and possible transfusion was also provided.    Procedural Preparation: Time out was performed and patient was identified by name and birthdate and procedure to be performed and side for sampling, if any, was specified. Pt was intubated by anesthesia.  The patient was appropriately draped.  Procedure Findings: Bronchoscope was inserted via ETT  without difficulty.  Posterior oropharynx, epiglottis, arytenoids, false cords and vocal cords were not visualized as these were bypassed by endotracheal tube.   The distal trachea was in circumference and appearance without mucosal, cartilaginous or branching abnormalities.  The main carina was nonsplayed .   All right and left lobar airways were visualized to  the Subsegmental level.  Sub- sub segmental carinae were identified in all the distal airways.   Secretions were visible in the following airways and appeared to be clear.  The mucosa was : mildly friable  Airways were notable for:        exophytic lesions :none       extrinsic compression in the following distributions: none.       Friable mucosa: none       Anthrocotic material /pigmentation: none   Pictorial documentation attached: images in pronation to be uplaoded to EPIC   Specimens obtained included:    Broncho-alveolar lavage site:RML  sent for micro and cytology                             Fentanyl used - 153mg Versed used - 879m  9015molume infused 71m37mlume returned with cloudy serosanguinous appearance  Total dosage of Lidocaine was 4mg 72mal fluoroscopy time was 0 minutes  Supplemental oxygen was provided at 10 lpm by nasal canula post operatively  Estimated Blood loss: 0cc.  Total procedure time: 21 min  Complications included:  none  Preliminary CXR findings :  Not indicated  Disposition: Home with daughter  Follow up with Dr. AleskLanney Gins0 days for result discussion.   Fred Claudette StaplerKC [facility]

## 2018-01-11 NOTE — Discharge Instructions (Signed)

## 2018-01-12 ENCOUNTER — Encounter: Payer: Self-pay | Admitting: Pulmonary Disease

## 2018-01-12 LAB — CYTOLOGY - NON PAP

## 2018-01-13 LAB — ACID FAST SMEAR (AFB)

## 2018-01-13 LAB — ACID FAST SMEAR (AFB, MYCOBACTERIA): Acid Fast Smear: NEGATIVE

## 2018-01-15 LAB — CULTURE, BAL-QUANTITATIVE W GRAM STAIN: Culture: 3000 — AB

## 2018-02-01 LAB — CULTURE, FUNGUS WITHOUT SMEAR

## 2018-02-24 LAB — ACID FAST CULTURE WITH REFLEXED SENSITIVITIES (MYCOBACTERIA): Acid Fast Culture: NEGATIVE

## 2018-04-06 ENCOUNTER — Other Ambulatory Visit: Payer: Self-pay | Admitting: Pulmonary Disease

## 2018-04-06 ENCOUNTER — Other Ambulatory Visit: Payer: Self-pay

## 2018-04-06 ENCOUNTER — Emergency Department: Payer: Medicare Other

## 2018-04-06 ENCOUNTER — Other Ambulatory Visit (HOSPITAL_COMMUNITY): Payer: Self-pay | Admitting: Pulmonary Disease

## 2018-04-06 ENCOUNTER — Emergency Department
Admission: EM | Admit: 2018-04-06 | Discharge: 2018-04-07 | Disposition: A | Discharge status: Home / Self Care | Payer: Medicare Other | Attending: Emergency Medicine | Admitting: Emergency Medicine

## 2018-04-06 DIAGNOSIS — R519 Headache, unspecified: Secondary | ICD-10-CM

## 2018-04-06 DIAGNOSIS — Z7902 Long term (current) use of antithrombotics/antiplatelets: Secondary | ICD-10-CM | POA: Diagnosis not present

## 2018-04-06 DIAGNOSIS — Z7982 Long term (current) use of aspirin: Secondary | ICD-10-CM | POA: Insufficient documentation

## 2018-04-06 DIAGNOSIS — Z79899 Other long term (current) drug therapy: Secondary | ICD-10-CM | POA: Insufficient documentation

## 2018-04-06 DIAGNOSIS — Z950 Presence of cardiac pacemaker: Secondary | ICD-10-CM | POA: Diagnosis not present

## 2018-04-06 DIAGNOSIS — R51 Headache: Principal | ICD-10-CM

## 2018-04-06 DIAGNOSIS — Z8673 Personal history of transient ischemic attack (TIA), and cerebral infarction without residual deficits: Secondary | ICD-10-CM | POA: Insufficient documentation

## 2018-04-06 DIAGNOSIS — R0789 Other chest pain: Secondary | ICD-10-CM | POA: Insufficient documentation

## 2018-04-06 DIAGNOSIS — I10 Essential (primary) hypertension: Secondary | ICD-10-CM | POA: Insufficient documentation

## 2018-04-06 DIAGNOSIS — F0150 Vascular dementia without behavioral disturbance: Secondary | ICD-10-CM | POA: Diagnosis not present

## 2018-04-06 DIAGNOSIS — R079 Chest pain, unspecified: Secondary | ICD-10-CM

## 2018-04-06 LAB — CBC
HCT: 40.7 % (ref 36.0–46.0)
Hemoglobin: 12.9 g/dL (ref 12.0–15.0)
MCH: 29 pg (ref 26.0–34.0)
MCHC: 31.7 g/dL (ref 30.0–36.0)
MCV: 91.5 fL (ref 80.0–100.0)
NRBC: 0 % (ref 0.0–0.2)
Platelets: 188 10*3/uL (ref 150–400)
RBC: 4.45 MIL/uL (ref 3.87–5.11)
RDW: 15.2 % (ref 11.5–15.5)
WBC: 6.2 10*3/uL (ref 4.0–10.5)

## 2018-04-06 LAB — BASIC METABOLIC PANEL
ANION GAP: 9 (ref 5–15)
BUN: 18 mg/dL (ref 8–23)
CO2: 29 mmol/L (ref 22–32)
Calcium: 8.7 mg/dL — ABNORMAL LOW (ref 8.9–10.3)
Chloride: 102 mmol/L (ref 98–111)
Creatinine, Ser: 1.18 mg/dL — ABNORMAL HIGH (ref 0.44–1.00)
GFR calc Af Amer: 52 mL/min — ABNORMAL LOW (ref >60)
GFR calc non Af Amer: 45 mL/min — ABNORMAL LOW (ref >60)
Glucose, Bld: 109 mg/dL — ABNORMAL HIGH (ref 70–99)
Potassium: 3.1 mmol/L — ABNORMAL LOW (ref 3.5–5.1)
Sodium: 140 mmol/L (ref 135–145)

## 2018-04-06 LAB — TROPONIN I: Troponin I: <0.03 ng/mL (ref <0.03)

## 2018-04-06 MED ORDER — IOHEXOL 350 MG/ML SOLN
75.0000 mL | Freq: Once | INTRAVENOUS | Status: AC | PRN
  Administered 2018-04-06: 75 mL via INTRAVENOUS

## 2018-04-06 NOTE — ED Notes (Signed)
MD at bedside. 

## 2018-04-06 NOTE — ED Provider Notes (Signed)
[facility] Emergency Department Provider Note  Time seen: 8:40 PM  I have reviewed the triage vital signs and the nursing notes.   HISTORY  Chief Complaint Chest Pain   HPI Maria Horton is a 78 y.o. female with a past medical history of anxiety, chronic back pain, fibromyalgia, gastric reflux, hypertension, CVA, TIA, presents to the emergency department for left-sided chest pain.  According to the patient around 7 PM tonight she developed pain in the left side of her chest radiating into her left neck.  Patient states the pain is initially about a 6/10 in severity, dull pain.  States the pain is worse with deep inspiration.  Denies any shortness of breath besides chronic shortness of breath but states this is unchanged.  States she was minimally nauseated right when the pain first occurred but denies any nausea currently.  Overall appears very well, states her current pain level is a 5/10 dull pain in the left chest.   Past Medical History:  Diagnosis Date  . Anxiety   . Chronic back pain   . Constipation   . Depression   . Diverticulosis   . Fibromyalgia   . Gastroparesis   . GERD (gastroesophageal reflux disease)   . Hypertension   . Hypoglycemia   . Hypokalemia   . Osteoarthritis   . PONV (postoperative nausea and vomiting)   . Reflux   . Stroke ([place])   . TIA (transient ischemic attack)   . Vascular dementia ([place])   . Vitamin D deficiency     Patient Active Problem List   Diagnosis Date Noted  . Sick sinus syndrome ([place]) 12/14/2016  . Fall at home, initial encounter 11/14/2016  . TIA (transient ischemic attack) 09/10/2016  . Chronic pain syndrome 09/30/2015  . GERD (gastroesophageal reflux disease) 09/29/2015  . Severe recurrent major depression without psychotic features ([place]) 09/26/2015  . Fibromyalgia 09/26/2015  . Osteoarthritis 09/26/2015  . Hypertension 09/26/2015    Past Surgical History:  Procedure Laterality Date  . ABDOMINAL  HYSTERECTOMY    . ABDOMINOPLASTY    . BREAST BIOPSY Left 2005   bengin, with clip  . BUNIONECTOMY Bilateral   . CHOLECYSTECTOMY    . COLON SURGERY    . FLEXIBLE BRONCHOSCOPY Bilateral 01/11/2018   Procedure: FLEXIBLE BRONCHOSCOPY;  Surgeon: Ottie Glazier, MD;  Location: [facility] ORS;  Service: Thoracic;  Laterality: Bilateral;  . LEFT HEART CATH AND CORONARY ANGIOGRAPHY Left 10/26/2017   Procedure: LEFT HEART CATH AND CORONARY ANGIOGRAPHY;  Surgeon: Isaias Cowman, MD;  Location: [place] [facility];  Service: Cardiovascular;  Laterality: Left;  . NECK SURGERY    . PACEMAKER INSERTION N/A 12/14/2016   Procedure: INSERTION PACEMAKER;  Surgeon: Isaias Cowman, MD;  Location: [facility] ORS;  Service: Cardiovascular;  Laterality: N/A;  . ROTATOR CUFF REPAIR    . TONSILLECTOMY      Prior to Admission medications   Medication Sig Start Date End Date Taking? Authorizing Provider  aspirin EC 81 MG EC tablet Take 1 tablet (81 mg total) by mouth daily. 11/18/16   Dustin Flock, MD  b complex vitamins tablet Take 1 tablet daily by mouth.    [provider]  carbidopa-levodopa (SINEMET CR) 50-200 MG tablet Take 1 tablet by mouth daily.     [provider]  citalopram (CELEXA) 40 MG tablet Take 40 mg daily by mouth.     [provider]  clonazePAM (KLONOPIN) 0.5 MG tablet Take 1.5 tablets (0.75 mg total) by mouth  at bedtime. Patient taking differently: Take 0.5 mg by mouth at bedtime.  10/01/15   Pucilowska, Herma Ard B, MD  clopidogrel (PLAVIX) 75 MG tablet Take 75 mg by mouth daily. 09/21/16   [provider]  esomeprazole (NEXIUM) 40 MG capsule Take 40 mg by mouth 2 (two) times daily.    [provider]  hydrOXYzine (VISTARIL) 25 MG capsule Take 50 mg by mouth at bedtime as needed (for restless leg).     [provider]  metoprolol succinate (TOPROL-XL) 25 MG 24 hr tablet Take 25 mg by mouth daily. 08/30/17   [provider]   pravastatin (PRAVACHOL) 40 MG tablet Take 1 tablet (40 mg total) by mouth every evening. 11/17/16   Dustin Flock, MD  rOPINIRole (REQUIP) 0.5 MG tablet Take 1 mg by mouth at bedtime.     [provider]  traZODone (DESYREL) 100 MG tablet Take 200 mg by mouth at bedtime.     [provider]  triamterene-hydrochlorothiazide (MAXZIDE-25) 37.5-25 MG tablet Take 1 tablet by mouth daily.     [provider]  vitamin B-12 (CYANOCOBALAMIN) 1000 MCG tablet Take 1,000 mcg by mouth daily.    [provider]    Allergies  Allergen Reactions  . 5-Alpha Reductase Inhibitors   . Ace Inhibitors Other (See Comments)    achy  . Beta Adrenergic Blockers Other (See Comments)    Makes achy/feel sick  . Calcium Channel Blockers Other (See Comments)    Feels achy like sick  . Latex Rash  . Tape Rash    Family History  Problem Relation Age of Onset  . Hypertension Mother   . Breast cancer Sister 79    Social History Social History   Tobacco Use  . Smoking status: Never Smoker  . Smokeless tobacco: Never Used  Substance Use Topics  . Alcohol use: No  . Drug use: No    Review of Systems Constitutional: Negative for fever. Cardiovascular: Negative for chest pain. Respiratory: Negative for shortness of breath.  Positive for cough. Gastrointestinal: Negative for abdominal pain, vomiting Genitourinary: Negative for urinary compaints Musculoskeletal: Negative for musculoskeletal complaints Skin: Negative for skin complaints  Neurological: Negative for headache All other ROS negative  ____________________________________________   PHYSICAL EXAM:  VITAL SIGNS: ED Triage Vitals  Enc Vitals Group     BP 04/06/18 2005 110/64     Pulse Rate 04/06/18 2005 (!) 51     Resp 04/06/18 2005 13     Temp 04/06/18 2005 98.4 F (36.9 C)     Temp Source 04/06/18 2005 Oral     SpO2 04/06/18 1959 96 %     Weight 04/06/18 2007 150 lb (68 kg)     Height 04/06/18  2007 _0  (1.626 m)     Head Circumference --      Peak Flow --      Pain Score 04/06/18 2006 6     Pain Loc --      Pain Edu? --      Excl. in [place]? --    Constitutional: Alert and oriented. Well appearing and in no distress. Eyes: Normal exam ENT   Head: Normocephalic and atraumatic.   Mouth/Throat: Mucous membranes are moist. Cardiovascular: Normal rate, regular rhythm. No murmur Respiratory: Normal respiratory effort without tachypnea nor retractions. Breath sounds are clear Gastrointestinal: Soft and nontender. No distention. Musculoskeletal: Nontender with normal range of motion in all extremities.  Neurologic:  Normal speech and language. No gross focal neurologic  deficits Skin:  Skin is warm, dry and intact.  Psychiatric: Mood and affect are normal.   ____________________________________________    EKG  EKG viewed and interpreted by myself shows an atrial paced rhythm at 51 bpm with a narrow QRS, normal axis, QTC prolongation, nonspecific ST changes.  ____________________________________________    RADIOLOGY  CT scan is negative for PE.  ____________________________________________   INITIAL IMPRESSION / ASSESSMENT AND PLAN / ED COURSE  Pertinent labs & imaging results that were available during my care of the patient were reviewed by me and considered in my medical decision making (see chart for details).  Patient presents to the emergency department for onset of left-sided chest pain with some radiation into the left neck.  Patient states shortness of breath which is chronic but denies any increase in shortness of breath.  Denies any current nausea.  Denies any diaphoresis at any point.  Differential this time would include chest wall pain, ACS, given the pleuritic nature PE would also be on the differential at this time.  We will check labs, EKG and continue to closely monitor.  As long as the patient's creatinine is normal we will proceed with CT  angiography of the chest to rule out pulmonary embolus.  Patient agreeable to plan of care.  CT scan is negative for PE.  Initial labs including troponin are negative.  We will repeat a troponin.  Patient states she feels normal at this time.  Denies any increased pain of her baseline states she normally has pain due to her fibromyalgia and chronic neck and back pain.  Repeat troponin is negative we will discharge her PCP follow-up.  Patient agreeable to plan of care.  ____________________________________________   FINAL CLINICAL IMPRESSION(S) / ED DIAGNOSES  Chest pain   Harvest Dark, MD 04/06/18 2330

## 2018-04-06 NOTE — ED Triage Notes (Signed)
Pt BIB EMS from home for CP that started around 1900 today. Pt states pain started central chest and has moved to left chest and left side of neck. Pt reports some dizziness and nausea.Pace maker placed in November and was "checked" yesterday and states " they did some test that made my heart feel funny, but this feels different". VSS on arrival. 324 ASA taken by pt PTA.

## 2018-04-07 NOTE — ED Notes (Signed)
Pt verbalized understanding of d/c instructions, and f/u care. No further questions at this time. Pt assisted to exit via wheelchair.   

## 2018-04-07 NOTE — ED Notes (Signed)
Pt reporting increase in upper chest tightness, states " it just feels like tightness in my muscles". MD Roxan Hockey notified has reviewed EKG and Labs and is comfortable with continued discharge.

## 2018-04-17 ENCOUNTER — Encounter (HOSPITAL_COMMUNITY): Payer: Self-pay

## 2018-04-17 ENCOUNTER — Ambulatory Visit (HOSPITAL_COMMUNITY): Payer: Medicare Other

## 2018-05-15 ENCOUNTER — Ambulatory Visit: Payer: Self-pay | Admitting: Urology

## 2018-06-12 ENCOUNTER — Encounter: Payer: Self-pay | Admitting: Anesthesiology

## 2018-06-23 ENCOUNTER — Ambulatory Visit (HOSPITAL_COMMUNITY): Admission: RE | Admit: 2018-06-23 | Payer: Medicare Other | Source: Ambulatory Visit

## 2018-06-23 ENCOUNTER — Telehealth (HOSPITAL_COMMUNITY): Payer: Self-pay | Admitting: *Deleted

## 2018-06-23 ENCOUNTER — Encounter (HOSPITAL_COMMUNITY): Payer: Self-pay

## 2018-07-03 ENCOUNTER — Encounter: Payer: Self-pay | Admitting: Urology

## 2018-07-03 ENCOUNTER — Ambulatory Visit: Payer: Self-pay | Admitting: Urology

## 2018-07-13 ENCOUNTER — Ambulatory Visit (HOSPITAL_COMMUNITY)
Admission: RE | Admit: 2018-07-13 | Discharge: 2018-07-13 | Disposition: A | Discharge status: Home / Self Care | Payer: Medicare Other | Source: Ambulatory Visit | Attending: Pulmonary Disease | Admitting: Pulmonary Disease

## 2018-07-13 ENCOUNTER — Other Ambulatory Visit: Payer: Self-pay

## 2018-07-13 DIAGNOSIS — R51 Headache: Secondary | ICD-10-CM | POA: Insufficient documentation

## 2018-07-13 DIAGNOSIS — R519 Headache, unspecified: Secondary | ICD-10-CM

## 2018-07-20 ENCOUNTER — Other Ambulatory Visit: Payer: Self-pay | Admitting: Pulmonary Disease

## 2018-07-20 DIAGNOSIS — J479 Bronchiectasis, uncomplicated: Secondary | ICD-10-CM

## 2018-07-20 DIAGNOSIS — R0609 Other forms of dyspnea: Secondary | ICD-10-CM

## 2018-07-25 ENCOUNTER — Ambulatory Visit: Admission: RE | Admit: 2018-07-25 | Payer: Medicare Other | Source: Ambulatory Visit

## 2018-09-11 ENCOUNTER — Other Ambulatory Visit: Payer: Self-pay

## 2018-09-11 ENCOUNTER — Encounter: Payer: Self-pay | Admitting: Urology

## 2018-09-11 ENCOUNTER — Ambulatory Visit (INDEPENDENT_AMBULATORY_CARE_PROVIDER_SITE_OTHER): Payer: Medicare Other | Admitting: Urology

## 2018-09-11 VITALS — BP 135/83 | HR 55 | Ht 64.0 in | Wt 150.0 lb

## 2018-09-11 DIAGNOSIS — N3946 Mixed incontinence: Secondary | ICD-10-CM

## 2018-09-11 MED ORDER — MIRABEGRON ER 50 MG PO TB24: 50.0000 mg | ORAL_TABLET | Freq: Every day | ORAL | 11 refills | Status: DC

## 2018-09-11 NOTE — Addendum Note (Signed)
Addended by: Verlene Mayer A on: 09/11/2018 03:13 PM   Modules accepted: Orders

## 2018-09-11 NOTE — Addendum Note (Signed)
Addended by: Verlene Mayer A on: 09/11/2018 04:21 PM   Modules accepted: Orders

## 2018-09-11 NOTE — Progress Notes (Signed)
09/11/2018 2:48 PM   Maria Horton [birth year] [id number]  Referring provider: Baxter Hire, MD Maria Horton,  Maria Horton [address]  Chief Complaint  Patient presents with  . Urinary Incontinence    HPI: I was consulted to assess the patient is urinary continence worsening over many months.  History was more difficult.  She leaks with coughing sneezing bending lifting.  The primary component is urge incontinence.  She wears a pad at night and not certain if she has bedwetting.  She wears a least 3 pads a day and she said the leakage is less severe  She voids every 1-2 hours and gets up once or twice at night.  She has had a stroke.  She has had a hysterectomy.  She distantly had a stone.  No medical therapy.  Does not get bladder infections.  Modifying factors: There are no other modifying factors  Associated signs and symptoms: There are no other associated signs and symptoms Aggravating and relieving factors: There are no other aggravating or relieving factors Severity: Moderate Duration: Persistent   PMH: Past Medical History:  Diagnosis Date  . Anxiety   . Chronic back pain   . Constipation   . Depression   . Diverticulosis   . Fibromyalgia   . Gastroparesis   . GERD (gastroesophageal reflux disease)   . Hypertension   . Hypoglycemia   . Hypokalemia   . Osteoarthritis   . PONV (postoperative nausea and vomiting)   . Reflux   . Stroke (Maria Horton)   . TIA (transient ischemic attack)   . Vascular dementia (Maria Horton)   . Vitamin D deficiency     Surgical History: Past Surgical History:  Procedure Laterality Date  . ABDOMINAL HYSTERECTOMY    . ABDOMINOPLASTY    . BREAST BIOPSY Left 2005   bengin, with clip  . BUNIONECTOMY Bilateral   . CHOLECYSTECTOMY    . COLON SURGERY    . FLEXIBLE BRONCHOSCOPY Bilateral 01/11/2018   Procedure: FLEXIBLE BRONCHOSCOPY;  Surgeon: Ottie Glazier, MD;  Location: [facility] ORS;  Service: Thoracic;  Laterality: Bilateral;   . LEFT HEART CATH AND CORONARY ANGIOGRAPHY Left 10/26/2017   Procedure: LEFT HEART CATH AND CORONARY ANGIOGRAPHY;  Surgeon: Isaias Cowman, MD;  Location: [place] [facility];  Service: Cardiovascular;  Laterality: Left;  . NECK SURGERY    . PACEMAKER INSERTION N/A 12/14/2016   Procedure: INSERTION PACEMAKER;  Surgeon: Isaias Cowman, MD;  Location: [facility] OR[facility];  Service: Cardiovascular;  Laterality: N/A;  . ROTATOR CUFF REPAIR    . TONSILLECTOMY      Home Medications:  Allergies as of 09/11/2018      Reactions   5-alpha Reductase Inhibitors    Ace Inhibitors Other (See Comments)   achy   Beta Adrenergic Blockers Other (See Comments)   Makes achy/feel sick   Calcium Channel Blockers Other (See Comments)   Feels achy like sick   Latex Rash   Tape Rash      Medication List       Accurate as of September 11, 2018  2:48 PM. If you have any questions, ask your nurse or doctor.        aspirin 81 MG EC tablet Take 1 tablet (81 mg total) by mouth daily.   b complex vitamins tablet Take 1 tablet daily by mouth.   buPROPion 300 MG 24 hr tablet Commonly known as: WELLBUTRIN XL   carbidopa-levodopa 50-200 MG tablet Commonly known as: SINEMET CR Take 1 tablet  by mouth daily.   citalopram 40 MG tablet Commonly known as: CELEXA Take 40 mg daily by mouth.   clonazePAM 0.5 MG tablet Commonly known as: KLONOPIN Take 1.5 tablets (0.75 mg total) by mouth at bedtime. What changed: how much to take   clopidogrel 75 MG tablet Commonly known as: PLAVIX Take 75 mg by mouth daily.   esomeprazole 40 MG capsule Commonly known as: NEXIUM Take 40 mg by mouth 2 (two) times daily.   hydrOXYzine 25 MG capsule Commonly known as: VISTARIL Take 50 mg by mouth at bedtime as needed (for restless leg).   hydrOXYzine 50 MG tablet Commonly known as: ATARAX/VISTARIL Take by mouth.   metoprolol succinate 25 MG 24 hr tablet Commonly known as: TOPROL-XL Take 25 mg by mouth daily.    pravastatin 40 MG tablet Commonly known as: PRAVACHOL Take 1 tablet (40 mg total) by mouth every evening.   rOPINIRole 0.5 MG tablet Commonly known as: REQUIP Take 1 mg by mouth at bedtime.   traZODone 100 MG tablet Commonly known as: DESYREL Take 200 mg by mouth at bedtime.   triamterene-hydrochlorothiazide 37.5-25 MG tablet Commonly known as: MAXZIDE-25 Take 1 tablet by mouth daily.   vitamin B-12 1000 MCG tablet Commonly known as: CYANOCOBALAMIN Take 1,000 mcg by mouth daily.       Allergies:  Allergies  Allergen Reactions  . 5-Alpha Reductase Inhibitors   . Ace Inhibitors Other (See Comments)    achy  . Beta Adrenergic Blockers Other (See Comments)    Makes achy/feel sick  . Calcium Channel Blockers Other (See Comments)    Feels achy like sick  . Latex Rash  . Tape Rash    Family History: Family History  Problem Relation Age of Onset  . Hypertension Mother   . Breast cancer Sister 68    Social History:  reports that she has never smoked. She has never used smokeless tobacco. She reports that she does not drink alcohol or use drugs.  ROS: UROLOGY Frequent Urination?: No Hard to postpone urination?: Yes Burning/pain with urination?: No Get up at night to urinate?: No Leakage of urine?: Yes Urine stream starts and stops?: Yes Trouble starting stream?: No Do you have to strain to urinate?: No Blood in urine?: No Urinary tract infection?: No Sexually transmitted disease?: No Injury to kidneys or bladder?: No Painful intercourse?: No Weak stream?: No Currently pregnant?: No Vaginal bleeding?: No Last menstrual period?: N  Gastrointestinal Nausea?: No Vomiting?: No Indigestion/heartburn?: No Diarrhea?: No Constipation?: No  Constitutional Night sweats?: No Weight loss?: No Fatigue?: No  Skin Skin rash/lesions?: No Itching?: No  Eyes Blurred vision?: No Double vision?: No  Ears/Nose/Throat Sore throat?: No Sinus problems?: No   Hematologic/Lymphatic Swollen glands?: No Easy bruising?: No  Cardiovascular Leg swelling?: No Chest pain?: No  Respiratory Cough?: No Shortness of breath?: No  Endocrine Excessive thirst?: No  Musculoskeletal Back pain?: No Joint pain?: No  Neurological Headaches?: No Dizziness?: No  Psychologic Depression?: No Anxiety?: No  Physical Exam: BP 135/83   Pulse (!) 55   Ht _0  (1.626 m)   Wt 150 lb (68 kg)   BMI 25.75 kg/m   Constitutional:  Alert and oriented, No acute distress. HEENT: [place] AT, moist mucus membranes.  Trachea midline, no masses. Cardiovascular: No clubbing, cyanosis, or edema. Respiratory: Normal respiratory effort, no increased work of breathing. GI: Abdomen is soft, nontender, nondistended, no abdominal masses GU: Narrow introitus no prolapse no stress incontinence with a modest cough  Skin: No rashes, bruises or suspicious lesions. Lymph: No cervical or inguinal adenopathy. Neurologic: Grossly intact, no focal deficits, moving all 4 extremities. Psychiatric: Normal mood and affect.  Laboratory Data: Lab Results  Component Value Date   WBC 6.2 04/06/2018   HGB 12.9 04/06/2018   HCT 40.7 04/06/2018   MCV 91.5 04/06/2018   PLT 188 04/06/2018    Lab Results  Component Value Date   CREATININE 1.18 (H) 04/06/2018    No results found for: PSA  No results found for: TESTOSTERONE  Lab Results  Component Value Date   HGBA1C 5.4 11/15/2016    Urinalysis    Component Value Date/Time   COLORURINE YELLOW (A) 10/08/2017 1938   APPEARANCEUR CLEAR (A) 10/08/2017 1938   LABSPEC 1.008 10/08/2017 1938   PHURINE 7.0 10/08/2017 1938   GLUCOSEU NEGATIVE 10/08/2017 1938   HGBUR NEGATIVE 10/08/2017 1938   BILIRUBINUR NEGATIVE 10/08/2017 1938   KETONESUR NEGATIVE 10/08/2017 1938   PROTEINUR NEGATIVE 10/08/2017 1938   NITRITE NEGATIVE 10/08/2017 1938   LEUKOCYTESUR NEGATIVE 10/08/2017 1938    Pertinent Imaging:   Assessment & Plan:  Patient has mixed incontinence and she tends to be a little bit more frail.  She has moderate frequency and milder nocturia.  We will try to help her with medical therapy and/or percutaneous tibial nerve stimulation.  Reassess in 6 weeks on Myrbetriq 50 mg samples and prescription.  I will not order urodynamics at this stage.  I will get a urine culture next time if not given today  There are no diagnoses linked to this encounter.  No follow-ups on file.  Reece Packer, MD  [facility] [address] [place], [place] [address] [phone]

## 2018-09-12 LAB — URINALYSIS, COMPLETE
Bilirubin, UA: NEGATIVE
Glucose, UA: NEGATIVE
Leukocytes,UA: NEGATIVE
Nitrite, UA: NEGATIVE
RBC, UA: NEGATIVE
Specific Gravity, UA: >1.03 — ABNORMAL HIGH (ref 1.005–1.030)
Urobilinogen, Ur: 0.2 mg/dL (ref 0.2–1.0)
pH, UA: 5.5 (ref 5.0–7.5)

## 2018-09-12 LAB — MICROSCOPIC EXAMINATION: RBC, Urine: NONE SEEN /hpf (ref 0–2)

## 2018-09-30 IMAGING — US US CAROTID DUPLEX BILAT
1 series · 13 of 24 positions shown · non-contrast
Comparison: None.

CLINICAL DATA: Cerebral infarction, syncope and hypertension.

EXAM:
BILATERAL CAROTID DUPLEX ULTRASOUND
TECHNIQUE: Gray scale imaging, color Doppler and duplex ultrasound were
performed of bilateral carotid and vertebral arteries in the neck.

[Series 1: us carotid duplex bilat · 13 of 70 slices shown]
[im 1/70]
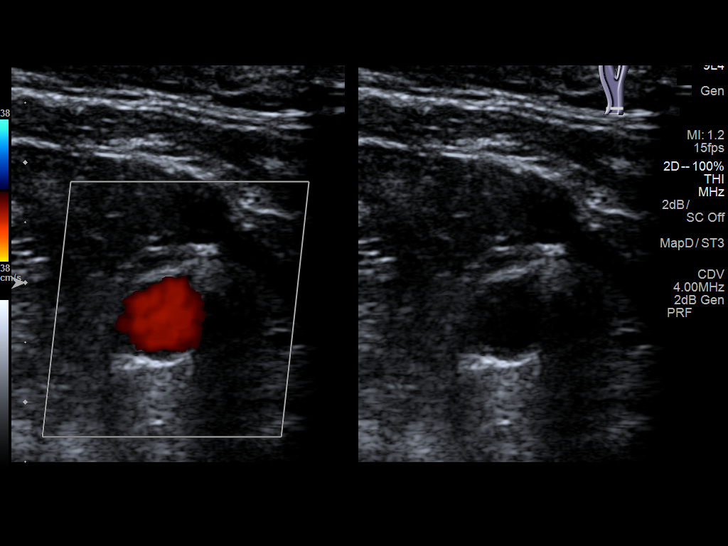
[im 7/70]
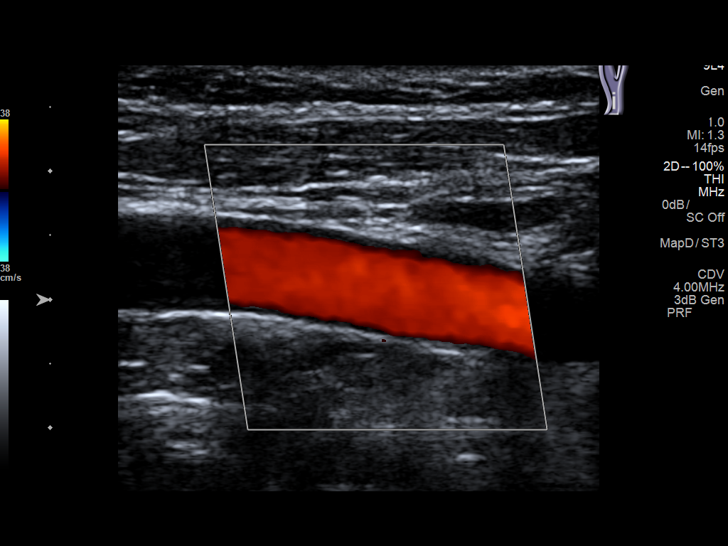
[im 13/70]
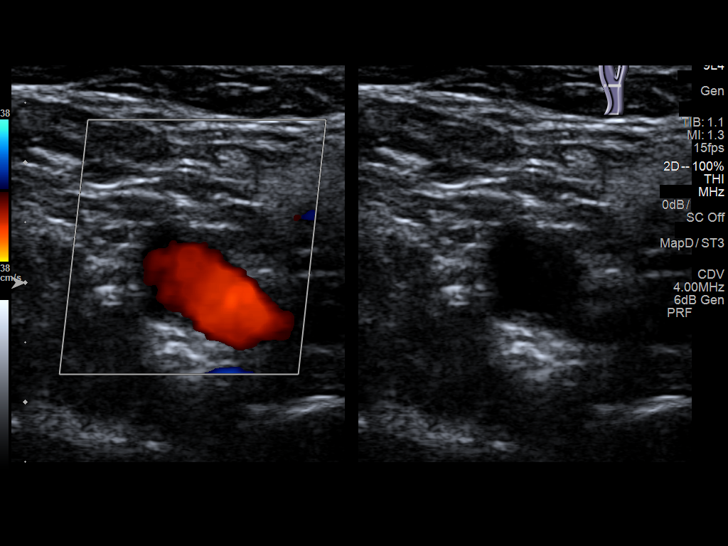
[im 19/70]
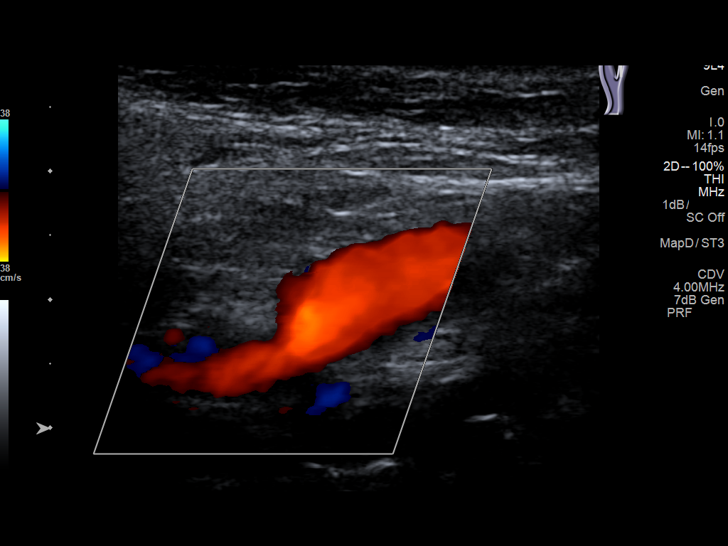
[im 25/70]
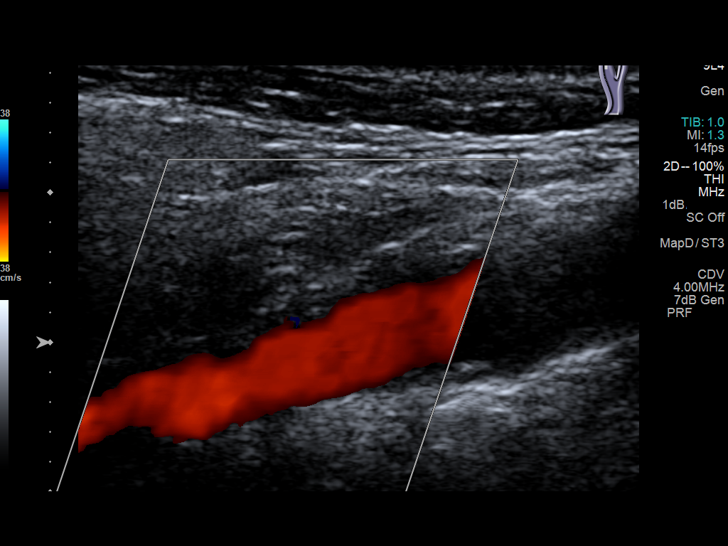
[im 31/70]
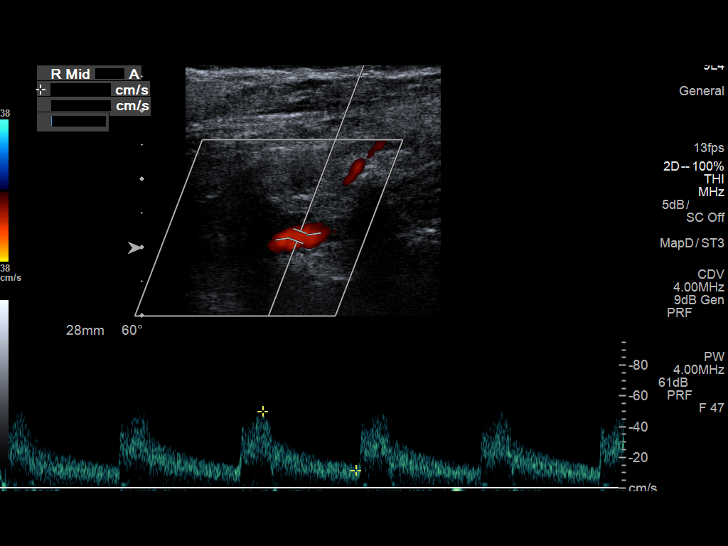
[im 37/70]
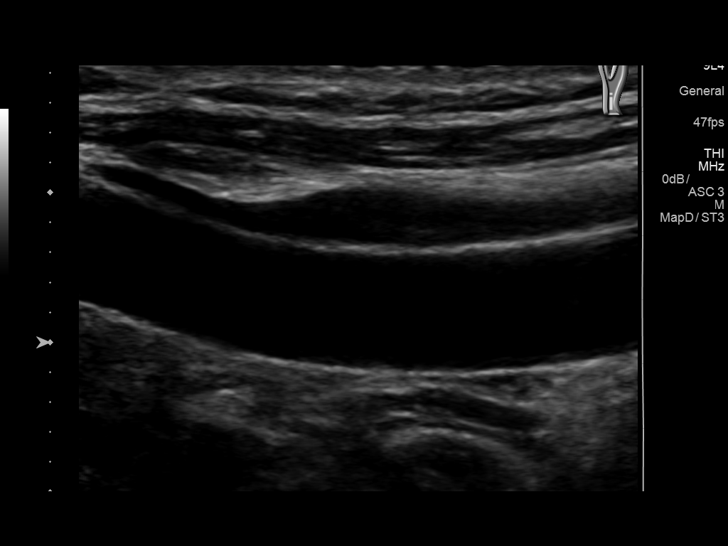
[im 40/70]
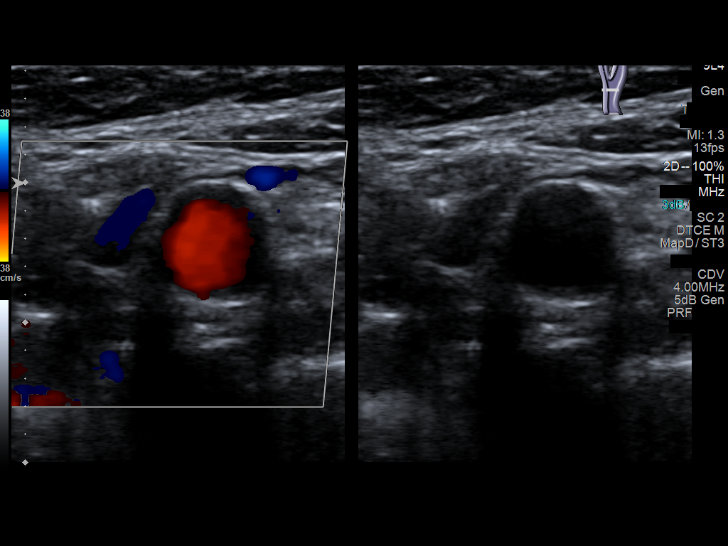
[im 46/70]
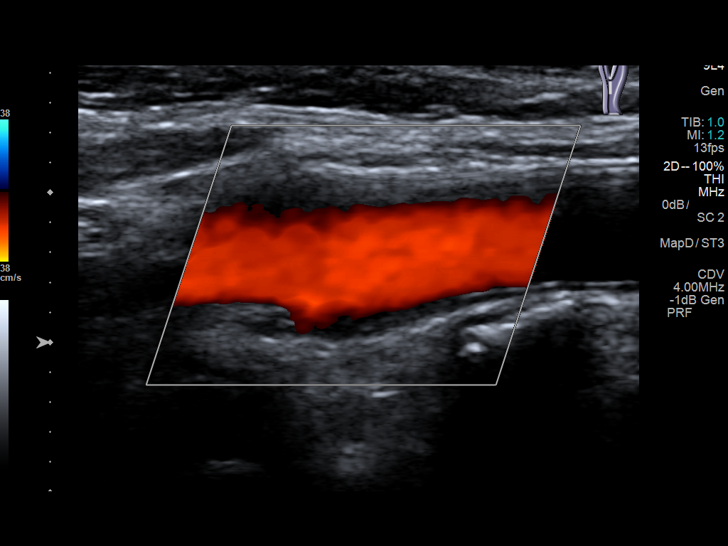
[im 52/70]
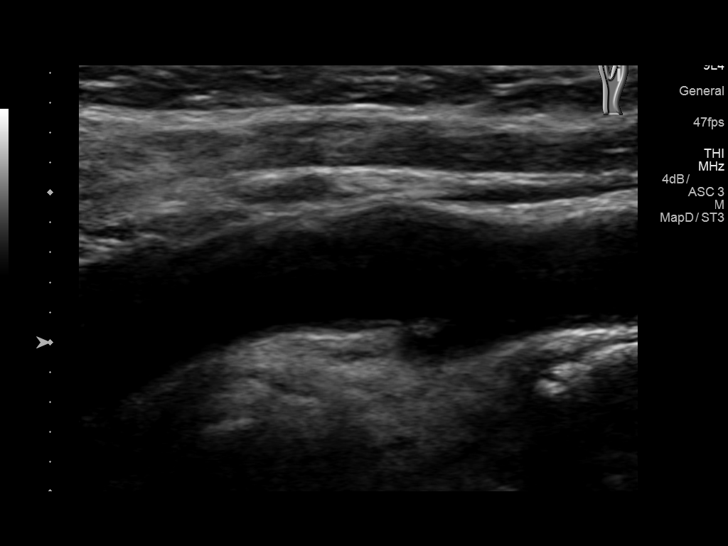
[im 58/70]
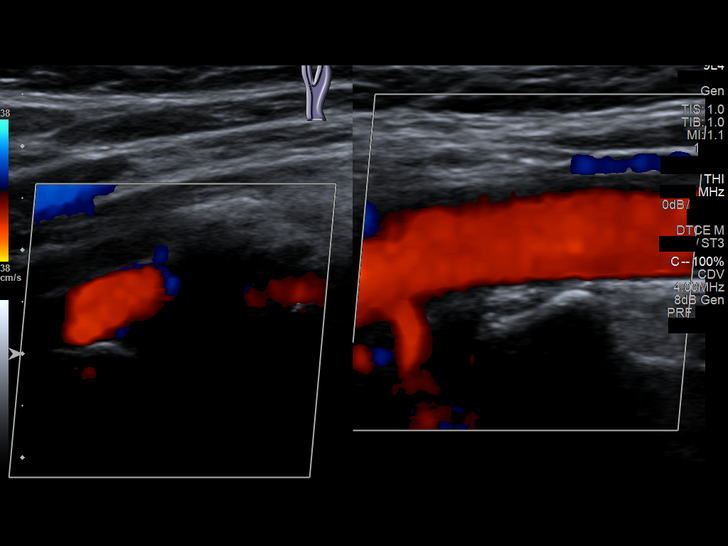
[im 64/70]
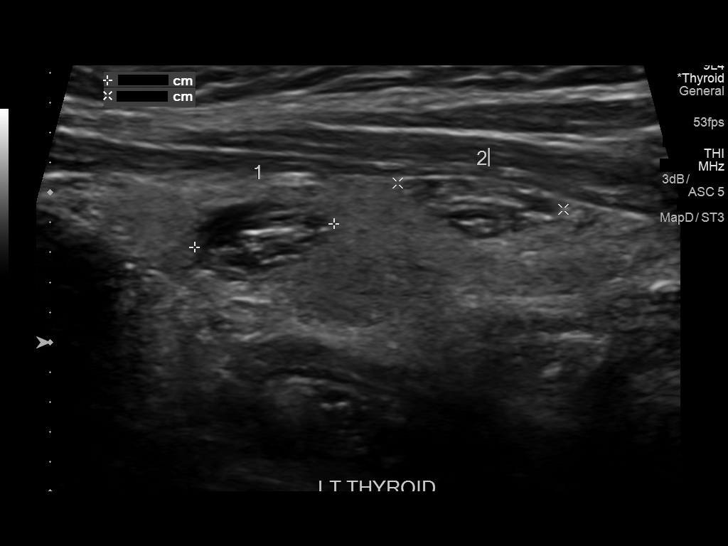
[im 70/70]
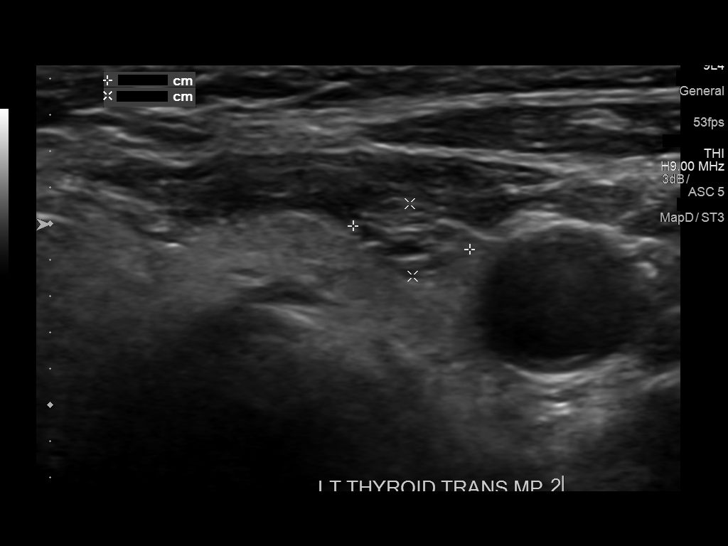

[13 of 24 positions shown; findings below may reference images not displayed]

FINDINGS: Criteria: Quantification of carotid stenosis is based on velocity
parameters that correlate the residual internal carotid diameter
with NASCET-based stenosis levels, using the diameter of the distal
internal carotid lumen as the denominator for stenosis measurement.

The following velocity measurements were obtained:

RIGHT

ICA:  51/12 cm/sec

CCA:  63/9 cm/sec

SYSTOLIC ICA/CCA RATIO:

DIASTOLIC ICA/CCA RATIO:

ECA:  79 cm/sec

LEFT

ICA:  59/16 cm/sec

CCA:  66/13 cm/sec

SYSTOLIC ICA/CCA RATIO:

DIASTOLIC ICA/CCA RATIO:

ECA:  66 cm/sec

RIGHT CAROTID ARTERY: No significant plaque identified. Velocities
and waveforms are normal. No evidence of right carotid stenosis in
the neck.

RIGHT VERTEBRAL ARTERY: Antegrade flow with normal waveform and
velocity.

LEFT CAROTID ARTERY: Mild amount of plaque at the level of the
carotid bulb and proximal ICA. No significant stenosis identified
with estimated less than 50% left ICA stenosis.

LEFT VERTEBRAL ARTERY: Antegrade flow with normal waveform and
velocity.

Incidental small left thyroid nodules of 1 cm or less in diameter.
These have a spongiform appearance by ultrasound and are likely
benign. The entire thyroid gland was not imaged.
IMPRESSION: 1. Mild amount of plaque at the level of the left carotid bulb and
proximal ICA. Estimated left ICA stenosis is less than 50%.
2. No evidence of right carotid stenosis.
3. Incidental small left thyroid nodules which appear spongiform and
benign.

## 2018-10-30 ENCOUNTER — Other Ambulatory Visit: Payer: Self-pay

## 2018-10-30 ENCOUNTER — Ambulatory Visit (INDEPENDENT_AMBULATORY_CARE_PROVIDER_SITE_OTHER): Payer: Medicare Other | Admitting: Urology

## 2018-10-30 ENCOUNTER — Encounter: Payer: Self-pay | Admitting: Urology

## 2018-10-30 VITALS — BP 110/78 | HR 58 | Ht 64.0 in | Wt 150.0 lb

## 2018-10-30 DIAGNOSIS — N3946 Mixed incontinence: Secondary | ICD-10-CM | POA: Diagnosis not present

## 2018-10-30 MED ORDER — MIRABEGRON ER 50 MG PO TB24: 50.0000 mg | ORAL_TABLET | Freq: Every day | ORAL | 11 refills | Status: AC

## 2018-10-30 MED ORDER — MIRABEGRON ER 50 MG PO TB24: 50.0000 mg | ORAL_TABLET | Freq: Every day | ORAL | 11 refills | Status: DC

## 2018-10-30 NOTE — Progress Notes (Signed)
10/30/2018 1:18 PM   Maria Horton 1940/06/08 [id number]  Referring provider: Baxter Hire, MD [place],  [place] [id number]  Chief Complaint  Patient presents with  . Follow-up    HPI: I was consulted to assess the patient is urinary continence worsening over many months.  History was more difficult.  She leaks with coughing sneezing bending lifting.  The primary component is urge incontinence.  She wears a pad at night and not certain if she has bedwetting.  She wears a least 3 pads a day and she said the leakage is less severe  She voids every 1-2 hours and gets up once or twice at night.  She has had a stroke.   Narrow introitus no prolapse no stress incontinence with a modest cough  Patient has mixed incontinence and she tends to be a little bit more frail.  She has moderate frequency and milder nocturia.  We will try to help her with medical therapy and/or percutaneous tibial nerve stimulation.  Reassess in 6 weeks on Myrbetriq 50 mg samples and prescription.  I will not order urodynamics at this stage.  I will get a urine culture next time   Today Frequency stable.  I think the 4 weeks of samples helped a lot and she agreed but did not fill the prescription.  We talked about switching medications but we both agreed to stay on this for a while  PMH: Past Medical History:  Diagnosis Date  . Anxiety   . Chronic back pain   . Constipation   . Depression   . Diverticulosis   . Fibromyalgia   . Gastroparesis   . GERD (gastroesophageal reflux disease)   . Hypertension   . Hypoglycemia   . Hypokalemia   . Osteoarthritis   . PONV (postoperative nausea and vomiting)   . Reflux   . Stroke ([place])   . TIA (transient ischemic attack)   . Vascular dementia ([place])   . Vitamin D deficiency     Surgical History: Past Surgical History:  Procedure Laterality Date  . ABDOMINAL HYSTERECTOMY    . ABDOMINOPLASTY    . BREAST BIOPSY Left 2005   bengin, with  clip  . BUNIONECTOMY Bilateral   . CHOLECYSTECTOMY    . COLON SURGERY    . FLEXIBLE BRONCHOSCOPY Bilateral 01/11/2018   Procedure: FLEXIBLE BRONCHOSCOPY;  Surgeon: Ottie Glazier, MD;  Location: [facility];  Service: Thoracic;  Laterality: Bilateral;  . LEFT HEART CATH AND CORONARY ANGIOGRAPHY Left 10/26/2017   Procedure: LEFT HEART CATH AND CORONARY ANGIOGRAPHY;  Surgeon: Isaias Cowman, MD;  Location: [facility];  Service: Cardiovascular;  Laterality: Left;  . NECK SURGERY    . PACEMAKER INSERTION N/A 12/14/2016   Procedure: INSERTION PACEMAKER;  Surgeon: Isaias Cowman, MD;  Location: [facility];  Service: Cardiovascular;  Laterality: N/A;  . ROTATOR CUFF REPAIR    . TONSILLECTOMY      Home Medications:  Allergies as of 10/30/2018      Reactions   5-alpha Reductase Inhibitors    Ace Inhibitors Other (See Comments)   achy   Beta Adrenergic Blockers Other (See Comments)   Makes achy/feel sick   Calcium Channel Blockers Other (See Comments)   Feels achy like sick   Latex Rash   Tape Rash      Medication List       Accurate as of October 30, 2018  1:18 PM. If you have any questions, ask your nurse or  doctor.        amitriptyline 10 MG tablet Commonly known as: ELAVIL TAKE 1 TABLET (10 MG TOTAL) BY MOUTH NIGHTLY FOR 90 DAYS   aspirin 81 MG EC tablet Take 1 tablet (81 mg total) by mouth daily.   b complex vitamins tablet Take 1 tablet daily by mouth.   buPROPion 300 MG 24 hr tablet Commonly known as: WELLBUTRIN XL   carbidopa-levodopa 50-200 MG tablet Commonly known as: SINEMET CR Take 1 tablet by mouth daily.   citalopram 40 MG tablet Commonly known as: CELEXA Take 40 mg daily by mouth.   clonazePAM 0.5 MG tablet Commonly known as: KLONOPIN Take 1.5 tablets (0.75 mg total) by mouth at bedtime. What changed: how much to take   clopidogrel 75 MG tablet Commonly known as: PLAVIX Take 75 mg by mouth daily.   esomeprazole 40 MG capsule  Commonly known as: NEXIUM Take 40 mg by mouth 2 (two) times daily.   hydrOXYzine 25 MG capsule Commonly known as: VISTARIL Take 50 mg by mouth at bedtime as needed (for restless leg).   hydrOXYzine 50 MG tablet Commonly known as: ATARAX/VISTARIL Take by mouth.   metoprolol succinate 25 MG 24 hr tablet Commonly known as: TOPROL-XL Take 25 mg by mouth daily.   mirabegron ER 50 MG Tb24 tablet Commonly known as: MYRBETRIQ Take 1 tablet (50 mg total) by mouth daily.   pravastatin 40 MG tablet Commonly known as: PRAVACHOL Take 1 tablet (40 mg total) by mouth every evening.   rOPINIRole 0.5 MG tablet Commonly known as: REQUIP Take 1 mg by mouth at bedtime.   traZODone 100 MG tablet Commonly known as: DESYREL Take 200 mg by mouth at bedtime.   triamterene-hydrochlorothiazide 37.5-25 MG tablet Commonly known as: MAXZIDE-25 Take 1 tablet by mouth daily.   vitamin B-12 1000 MCG tablet Commonly known as: CYANOCOBALAMIN Take 1,000 mcg by mouth daily.       Allergies:  Allergies  Allergen Reactions  . 5-Alpha Reductase Inhibitors   . Ace Inhibitors Other (See Comments)    achy  . Beta Adrenergic Blockers Other (See Comments)    Makes achy/feel sick  . Calcium Channel Blockers Other (See Comments)    Feels achy like sick  . Latex Rash  . Tape Rash    Family History: Family History  Problem Relation Age of Onset  . Hypertension Mother   . Breast cancer Sister 17    Social History:  reports that she has never smoked. She has never used smokeless tobacco. She reports that she does not drink alcohol or use drugs.  ROS: UROLOGY Frequent Urination?: Yes Hard to postpone urination?: No Burning/pain with urination?: No Get up at night to urinate?: Yes Leakage of urine?: Yes Urine stream starts and stops?: No Trouble starting stream?: No Do you have to strain to urinate?: No Blood in urine?: No Urinary tract infection?: No Sexually transmitted disease?: No  Injury to kidneys or bladder?: No Painful intercourse?: No Weak stream?: No Currently pregnant?: No Vaginal bleeding?: No Last menstrual period?: N  Gastrointestinal Nausea?: No Vomiting?: No Indigestion/heartburn?: No Diarrhea?: No Constipation?: No  Constitutional Fever: No Night sweats?: No Weight loss?: No Fatigue?: No  Skin Skin rash/lesions?: No Itching?: No  Eyes Blurred vision?: No Double vision?: No  Ears/Nose/Throat Sore throat?: No Sinus problems?: No  Hematologic/Lymphatic Swollen glands?: No Easy bruising?: No  Cardiovascular Leg swelling?: No Chest pain?: No  Respiratory Cough?: No Shortness of breath?: No  Endocrine Excessive thirst?: No  Musculoskeletal Back pain?: No Joint pain?: No  Neurological Headaches?: No Dizziness?: No  Psychologic Depression?: Yes Anxiety?: No  Physical Exam: BP 110/78   Pulse (!) 58   Ht _0  (1.626 m)   Wt 68 kg   BMI 25.75 kg/m   Constitutional:  Alert and oriented, No acute distress.  Laboratory Data: Lab Results  Component Value Date   WBC 6.2 04/06/2018   HGB 12.9 04/06/2018   HCT 40.7 04/06/2018   MCV 91.5 04/06/2018   PLT 188 04/06/2018    Lab Results  Component Value Date   CREATININE 1.18 (H) 04/06/2018    No results found for: PSA  No results found for: TESTOSTERONE  Lab Results  Component Value Date   HGBA1C 5.4 11/15/2016    Urinalysis    Component Value Date/Time   COLORURINE YELLOW (A) 10/08/2017 1938   APPEARANCEUR Cloudy (A) 09/11/2018 1624   LABSPEC 1.008 10/08/2017 1938   PHURINE 7.0 10/08/2017 1938   GLUCOSEU Negative 09/11/2018 1624   HGBUR NEGATIVE 10/08/2017 1938   BILIRUBINUR Negative 09/11/2018 1624   KETONESUR NEGATIVE 10/08/2017 1938   PROTEINUR Trace (A) 09/11/2018 1624   PROTEINUR NEGATIVE 10/08/2017 1938   NITRITE Negative 09/11/2018 1624   NITRITE NEGATIVE 10/08/2017 1938   LEUKOCYTESUR Negative 09/11/2018 1624    Pertinent Imaging:    Assessment & Plan: Reassess in 6 weeks on Myrbetriq samples and prescription and utilize other medications if needed pending results  There are no diagnoses linked to this encounter.  No follow-ups on file.  Reece Packer, MD  [facility] [address] [place], [place] [address] [phone]

## 2018-10-31 ENCOUNTER — Inpatient Hospital Stay
Admission: EM | Admit: 2018-10-31 | Discharge: 2018-11-04 | DRG: 093 | Disposition: A | Discharge status: Skilled Nursing Facility | Payer: Medicare Other | Attending: Internal Medicine | Admitting: Internal Medicine

## 2018-10-31 ENCOUNTER — Emergency Department: Payer: Medicare Other

## 2018-10-31 ENCOUNTER — Other Ambulatory Visit: Payer: Self-pay

## 2018-10-31 DIAGNOSIS — F419 Anxiety disorder, unspecified: Secondary | ICD-10-CM | POA: Diagnosis present

## 2018-10-31 DIAGNOSIS — I495 Sick sinus syndrome: Secondary | ICD-10-CM | POA: Diagnosis present

## 2018-10-31 DIAGNOSIS — R42 Dizziness and giddiness: Secondary | ICD-10-CM | POA: Diagnosis not present

## 2018-10-31 DIAGNOSIS — G20 Parkinson's disease: Secondary | ICD-10-CM | POA: Diagnosis present

## 2018-10-31 DIAGNOSIS — R3915 Urgency of urination: Secondary | ICD-10-CM | POA: Diagnosis present

## 2018-10-31 DIAGNOSIS — R270 Ataxia, unspecified: Secondary | ICD-10-CM

## 2018-10-31 DIAGNOSIS — M25512 Pain in left shoulder: Secondary | ICD-10-CM | POA: Diagnosis present

## 2018-10-31 DIAGNOSIS — G2581 Restless legs syndrome: Secondary | ICD-10-CM | POA: Diagnosis present

## 2018-10-31 DIAGNOSIS — W19XXXA Unspecified fall, initial encounter: Secondary | ICD-10-CM

## 2018-10-31 DIAGNOSIS — Z803 Family history of malignant neoplasm of breast: Secondary | ICD-10-CM

## 2018-10-31 DIAGNOSIS — Z8673 Personal history of transient ischemic attack (TIA), and cerebral infarction without residual deficits: Secondary | ICD-10-CM

## 2018-10-31 DIAGNOSIS — Z888 Allergy status to other drugs, medicaments and biological substances status: Secondary | ICD-10-CM

## 2018-10-31 DIAGNOSIS — Z825 Family history of asthma and other chronic lower respiratory diseases: Secondary | ICD-10-CM

## 2018-10-31 DIAGNOSIS — Z7982 Long term (current) use of aspirin: Secondary | ICD-10-CM

## 2018-10-31 DIAGNOSIS — I10 Essential (primary) hypertension: Secondary | ICD-10-CM | POA: Diagnosis present

## 2018-10-31 DIAGNOSIS — F329 Major depressive disorder, single episode, unspecified: Secondary | ICD-10-CM | POA: Diagnosis present

## 2018-10-31 DIAGNOSIS — Z79899 Other long term (current) drug therapy: Secondary | ICD-10-CM

## 2018-10-31 DIAGNOSIS — Z7902 Long term (current) use of antithrombotics/antiplatelets: Secondary | ICD-10-CM

## 2018-10-31 DIAGNOSIS — R079 Chest pain, unspecified: Secondary | ICD-10-CM

## 2018-10-31 DIAGNOSIS — E785 Hyperlipidemia, unspecified: Secondary | ICD-10-CM | POA: Diagnosis present

## 2018-10-31 DIAGNOSIS — R296 Repeated falls: Secondary | ICD-10-CM | POA: Diagnosis present

## 2018-10-31 DIAGNOSIS — R260 Ataxic gait: Secondary | ICD-10-CM | POA: Diagnosis not present

## 2018-10-31 DIAGNOSIS — K219 Gastro-esophageal reflux disease without esophagitis: Secondary | ICD-10-CM | POA: Diagnosis present

## 2018-10-31 DIAGNOSIS — Z20828 Contact with and (suspected) exposure to other viral communicable diseases: Secondary | ICD-10-CM | POA: Diagnosis present

## 2018-10-31 DIAGNOSIS — Z833 Family history of diabetes mellitus: Secondary | ICD-10-CM

## 2018-10-31 DIAGNOSIS — R32 Unspecified urinary incontinence: Secondary | ICD-10-CM | POA: Diagnosis present

## 2018-10-31 DIAGNOSIS — Z950 Presence of cardiac pacemaker: Secondary | ICD-10-CM

## 2018-10-31 DIAGNOSIS — Z8249 Family history of ischemic heart disease and other diseases of the circulatory system: Secondary | ICD-10-CM

## 2018-10-31 LAB — URINALYSIS, COMPLETE
Bilirubin, UA: NEGATIVE
Glucose, UA: NEGATIVE
Nitrite, UA: NEGATIVE
Protein,UA: NEGATIVE
RBC, UA: NEGATIVE
Specific Gravity, UA: 1.025 (ref 1.005–1.030)
Urobilinogen, Ur: 1 mg/dL (ref 0.2–1.0)
pH, UA: 6.5 (ref 5.0–7.5)

## 2018-10-31 LAB — CBC WITH DIFFERENTIAL/PLATELET
Abs Immature Granulocytes: 0.03 10*3/uL (ref 0.00–0.07)
Basophils Absolute: 0 10*3/uL (ref 0.0–0.1)
Basophils Relative: 0 %
Eosinophils Absolute: 0.1 10*3/uL (ref 0.0–0.5)
Eosinophils Relative: 1 %
HCT: 40.9 % (ref 36.0–46.0)
Hemoglobin: 13.4 g/dL (ref 12.0–15.0)
Immature Granulocytes: 0 %
Lymphocytes Relative: 19 %
Lymphs Abs: 1.6 10*3/uL (ref 0.7–4.0)
MCH: 30.8 pg (ref 26.0–34.0)
MCHC: 32.8 g/dL (ref 30.0–36.0)
MCV: 94 fL (ref 80.0–100.0)
Monocytes Absolute: 0.9 10*3/uL (ref 0.1–1.0)
Monocytes Relative: 11 %
Neutro Abs: 5.5 10*3/uL (ref 1.7–7.7)
Neutrophils Relative %: 69 %
Platelets: 180 10*3/uL (ref 150–400)
RBC: 4.35 MIL/uL (ref 3.87–5.11)
RDW: 15.6 % — ABNORMAL HIGH (ref 11.5–15.5)
WBC: 8 10*3/uL (ref 4.0–10.5)
nRBC: 0 % (ref 0.0–0.2)

## 2018-10-31 LAB — URINALYSIS, COMPLETE (UACMP) WITH MICROSCOPIC
Bacteria, UA: NONE SEEN
Bilirubin Urine: NEGATIVE
Glucose, UA: NEGATIVE mg/dL
Hgb urine dipstick: NEGATIVE
Ketones, ur: 5 mg/dL — AB
Nitrite: NEGATIVE
Protein, ur: NEGATIVE mg/dL
Specific Gravity, Urine: 1.026 (ref 1.005–1.030)
pH: 5 (ref 5.0–8.0)

## 2018-10-31 LAB — BASIC METABOLIC PANEL
Anion gap: 13 (ref 5–15)
BUN: 17 mg/dL (ref 8–23)
CO2: 29 mmol/L (ref 22–32)
Calcium: 9.5 mg/dL (ref 8.9–10.3)
Chloride: 99 mmol/L (ref 98–111)
Creatinine, Ser: 1.21 mg/dL — ABNORMAL HIGH (ref 0.44–1.00)
GFR calc Af Amer: 50 mL/min — ABNORMAL LOW (ref >60)
GFR calc non Af Amer: 43 mL/min — ABNORMAL LOW (ref >60)
Glucose, Bld: 94 mg/dL (ref 70–99)
Potassium: 3.4 mmol/L — ABNORMAL LOW (ref 3.5–5.1)
Sodium: 141 mmol/L (ref 135–145)

## 2018-10-31 LAB — MICROSCOPIC EXAMINATION: RBC, Urine: NONE SEEN /hpf (ref 0–2)

## 2018-10-31 MED ORDER — SODIUM CHLORIDE 0.9 % IV SOLN
Freq: Once | INTRAVENOUS | Status: AC
  Administered 2018-11-01: 03:00:00 via INTRAVENOUS

## 2018-10-31 MED ORDER — SODIUM CHLORIDE 0.9 % IV BOLUS
1000.0000 mL | Freq: Once | INTRAVENOUS | Status: AC
  Administered 2018-10-31: 1000 mL via INTRAVENOUS

## 2018-10-31 NOTE — ED Triage Notes (Signed)
Pt to the er for a fall resuylting in injuries to her back and the back of her head. PT denies LOC. Pt fell last Thursday. Pt reports dizziness and family says her gait is off and she has blurry vision.

## 2018-10-31 NOTE — ED Provider Notes (Addendum)
[facility] Provider Note  ____________________________________________   First MD Initiated Contact with Patient 10/31/18 [phone]     (approximate)  I have reviewed the triage vital signs and the nursing notes.   HISTORY  Chief Complaint Fall    HPI RUPA LAGAN is a 78 y.o. female  Here with increasing falls.  Per report, has been living with daughter lately due to difficulty getting around. In last several months, pt has had increasing falls due to shuffling gait. Has been taking PT/OT at home with out significant improvement. Last Thursday was most recent, hit head and landed on back of L shoulder. On interview, she C/o cervical spine pain with h/o C-Spine surgery in past. Also c/o lower back and L shoulder pain. Moderate headache as well, generalized. On ASA, Plavix.  Pain is diffuse, aching, throbbing.   Denies any recent fever, chills.    Patient does endorse some mild urinary frequency.  She also endorses bilateral ear pressure and pain that has been ongoing.  She feels intermittent tinnitus.   Past Medical History:  Diagnosis Date   Anxiety    Chronic back pain    Constipation    Depression    Diverticulosis    Fibromyalgia    Gastroparesis    GERD (gastroesophageal reflux disease)    Hypertension    Hypoglycemia    Hypokalemia    Osteoarthritis    PONV (postoperative nausea and vomiting)    Reflux    Stroke ([place])    TIA (transient ischemic attack)    Vascular dementia ([place])    Vitamin D deficiency     Patient Active Problem List   Diagnosis Date Noted   Ataxia 11/01/2018   Sick sinus syndrome ([place]) 12/14/2016   Fall at home, initial encounter 11/14/2016   TIA (transient ischemic attack) 09/10/2016   Chronic pain syndrome 09/30/2015   GERD (gastroesophageal reflux disease) 09/29/2015   Severe recurrent major depression without psychotic features ([place]) 09/26/2015   Fibromyalgia  09/26/2015   Osteoarthritis 09/26/2015   Hypertension 09/26/2015    Past Surgical History:  Procedure Laterality Date   ABDOMINAL HYSTERECTOMY     ABDOMINOPLASTY     BREAST BIOPSY Left 2005   bengin, with clip   BUNIONECTOMY Bilateral    CHOLECYSTECTOMY     COLON SURGERY     FLEXIBLE BRONCHOSCOPY Bilateral 01/11/2018   Procedure: FLEXIBLE BRONCHOSCOPY;  Surgeon: Ottie Glazier, MD;  Location: [facility];  Service: Thoracic;  Laterality: Bilateral;   LEFT HEART CATH AND CORONARY ANGIOGRAPHY Left 10/26/2017   Procedure: LEFT HEART CATH AND CORONARY ANGIOGRAPHY;  Surgeon: Isaias Cowman, MD;  Location: [facility];  Service: Cardiovascular;  Laterality: Left;   NECK SURGERY     PACEMAKER INSERTION N/A 12/14/2016   Procedure: INSERTION PACEMAKER;  Surgeon: Isaias Cowman, MD;  Location: [facility];  Service: Cardiovascular;  Laterality: N/A;   ROTATOR CUFF REPAIR     TONSILLECTOMY      Prior to Admission medications   Medication Sig Start Date End Date Taking? Authorizing Provider  albuterol (ACCUNEB) 1.25 MG/3ML nebulizer solution Take 3 mLs by nebulization every 6 (six) hours as needed for wheezing.   Yes [provider]  aspirin EC 81 MG EC tablet Take 1 tablet (81 mg total) by mouth daily. 11/18/16  Yes Dustin Flock, MD  b complex vitamins tablet Take 1 tablet daily by mouth.   Yes [provider]  benzonatate (TESSALON) 100 MG capsule Take by  mouth 2 (two) times daily.   Yes [provider]  Bran Fiber 500 MG TABS Take 500 mg by mouth every other day.   Yes [provider]  buPROPion (WELLBUTRIN XL) 300 MG 24 hr tablet Take 300 mg by mouth daily.  09/09/18  Yes [provider]  carbidopa-levodopa (SINEMET CR) 50-200 MG tablet Take 1 tablet by mouth at bedtime.    Yes [provider]  carbidopa-levodopa (SINEMET IR) 25-100 MG tablet Take 1 tablet by mouth 3 (three) times daily.   Yes [provider]  cholecalciferol (VITAMIN D3) 25 MCG (1000 UT) tablet Take 1,000 Units by mouth daily.   Yes [provider]  citalopram (CELEXA) 40 MG tablet Take 40 mg daily by mouth.    Yes [provider]  clonazePAM (KLONOPIN) 0.5 MG tablet Take 1.5 tablets (0.75 mg total) by mouth at bedtime. Patient taking differently: Take 0.5 mg by mouth at bedtime.  10/01/15  Yes Pucilowska, Jolanta B, MD  clopidogrel (PLAVIX) 75 MG tablet Take 75 mg by mouth daily. 09/21/16  Yes [provider]  esomeprazole (NEXIUM) 40 MG capsule Take 40 mg by mouth at bedtime.    Yes [provider]  HYDROcodone-acetaminophen (NORCO) 7.5-325 MG tablet Take 1 tablet by mouth every 6 (six) hours as needed for moderate pain.   Yes [provider]  hydrOXYzine (ATARAX/VISTARIL) 50 MG tablet Take 50-100 mg by mouth at bedtime as needed (sleep).    Yes [provider]  Melatonin 5 MG CHEW Chew 10 mg by mouth at bedtime.   Yes [provider]  metoprolol succinate (TOPROL-XL) 25 MG 24 hr tablet Take 12.5 mg by mouth daily.  08/30/17  Yes [provider]  mirabegron ER (MYRBETRIQ) 50 MG TB24 tablet Take 1 tablet (50 mg total) by mouth daily. Patient taking differently: Take 50 mg by mouth at bedtime.  10/30/18  Yes MacDiarmid, Nicki Reaper, MD  pravastatin (PRAVACHOL) 40 MG tablet Take 1 tablet (40 mg total) by mouth every evening. 11/17/16  Yes Dustin Flock, MD  rOPINIRole (REQUIP) 0.5 MG tablet Take 1 mg by mouth at bedtime.    Yes [provider]  traZODone (DESYREL) 100 MG tablet Take 200 mg by mouth at bedtime.    Yes [provider]  triamterene-hydrochlorothiazide (MAXZIDE-25) 37.5-25 MG tablet Take 0.5 tablets by mouth daily.    Yes [provider]  vitamin B-12 (CYANOCOBALAMIN) 1000 MCG tablet Take 1,000 mcg by mouth daily.   Yes [provider]    Allergies 5-alpha reductase inhibitors, Ace inhibitors, Beta adrenergic  blockers, Calcium channel blockers, Latex, and Tape  Family History  Problem Relation Age of Onset   Hypertension Mother    Breast cancer Sister 8    Social History Social History   Tobacco Use   Smoking status: Never Smoker   Smokeless tobacco: Never Used  Substance Use Topics   Alcohol use: No   Drug use: No    Review of Systems  Review of Systems  Constitutional: Negative for fatigue and fever.  HENT: Negative for congestion and sore throat.   Eyes: Negative for visual disturbance.  Respiratory: Negative for cough and shortness of breath.   Cardiovascular: Negative for chest pain.  Gastrointestinal: Negative for abdominal pain, diarrhea, nausea and vomiting.  Genitourinary: Negative for flank pain.  Musculoskeletal: Positive for arthralgias and neck pain. Negative for back pain.  Skin: Negative for rash and wound.  Neurological: Positive for weakness and headaches.  All  other systems reviewed and are negative.    ____________________________________________  PHYSICAL EXAM:      VITAL SIGNS: ED Triage Vitals  Enc Vitals Group     BP 10/31/18 1734 102/65     Pulse Rate 10/31/18 1720 60     Resp 10/31/18 1720 18     Temp 10/31/18 1720 99.2 F (37.3 C)     Temp Source 10/31/18 1720 Oral     SpO2 10/31/18 1720 96 %     Weight --      Height 10/31/18 1720 _0  (1.626 m)     Head Circumference --      Peak Flow --      Pain Score 10/31/18 1735 5     Pain Loc --      Pain Edu? --      Excl. in [place]? --      Physical Exam Vitals signs and nursing note reviewed.  Constitutional:      General: She is not in acute distress.    Appearance: She is well-developed.  HENT:     Head: Normocephalic and atraumatic.     Comments: Serous TM effusions noted bilaterally. Air-fluid level noted on R with mild opacification along inferior border, but no erythema. Light reflex is intact. Eyes:     Conjunctiva/sclera: Conjunctivae normal.  Neck:     Musculoskeletal:  Neck supple.     Comments: Mild paraspinal TTP Cardiovascular:     Rate and Rhythm: Normal rate and regular rhythm.     Heart sounds: Normal heart sounds. No murmur. No friction rub.  Pulmonary:     Effort: Pulmonary effort is normal. No respiratory distress.     Breath sounds: Normal breath sounds. No wheezing or rales.  Abdominal:     General: There is no distension.     Palpations: Abdomen is soft.     Tenderness: There is no abdominal tenderness.  Musculoskeletal:     Comments: TTP over left scapula and proximal upper arm/shoulder, with ecchymoses. No deformity. ROM is full. Bruising noted to posterior thighs, no deformity. Mild TTP midline L-Spine  Skin:    General: Skin is warm.     Capillary Refill: Capillary refill takes less than 2 seconds.  Neurological:     Mental Status: She is alert and oriented to person, place, and time.     Motor: No abnormal muscle tone.     Comments: MAE with 5/5 strength. Normal sensation to light touch. CN intact.       ____________________________________________   LABS (all labs ordered are listed, but only abnormal results are displayed)  Labs Reviewed  CBC WITH DIFFERENTIAL/PLATELET - Abnormal; Notable for the following components:      Result Value   RDW 15.6 (*)    All other components within normal limits  BASIC METABOLIC PANEL - Abnormal; Notable for the following components:   Potassium 3.4 (*)    Creatinine, Ser 1.21 (*)    GFR calc non Af Amer 43 (*)    GFR calc Af Amer 50 (*)    All other components within normal limits  URINALYSIS, COMPLETE (UACMP) WITH MICROSCOPIC - Abnormal; Notable for the following components:   Color, Urine AMBER (*)    APPearance CLOUDY (*)    Ketones, ur 5 (*)    Leukocytes,Ua SMALL (*)    All other components within normal limits  SARS CORONAVIRUS 2 (HOSPITAL ORDER, [facility])    ____________________________________________  EKG: Atrial  paced rhythm, ventricular  rate 60, QRS narrow at 80.  No acute ST elevations or depressions.  ________________________________________  RADIOLOGY All imaging, including plain films, CT scans, and ultrasounds, independently reviewed by me, and interpretations confirmed via formal radiology reads.  ED MD interpretation:   CT Head/Cspine: Neg CXR: Clear XR Shoulder: Neg PXR/R Hip: Neg   Official radiology report(s): Ct Head Wo Contrast  Result Date: 10/31/2018 CLINICAL DATA:  Fall, injury to the back of the head and back the EXAM: CT HEAD WITHOUT CONTRAST; CT CERVICAL SPINE WITHOUT CONTRAST TECHNIQUE: Contiguous axial images were obtained from the base of the skull through the vertex without intravenous contrast. COMPARISON:  None. FINDINGS: Brain: No evidence of acute territorial infarction, hemorrhage, hydrocephalus,extra-axial collection or mass lesion/mass effect. There is dilatation the ventricles and sulci consistent with age-related atrophy. Low-attenuation changes in the deep white matter consistent with small vessel ischemia. Vascular: No hyperdense vessel or unexpected calcification. Skull: The skull is intact. No fracture or focal lesion identified. Sinuses/Orbits: The visualized paranasal sinuses and mastoid air cells are clear. The orbits and globes intact. Other: None Cervical spine: Alignment: Physiologic Skull base and vertebrae: Visualized skull base is intact. No atlanto-occipital dissociation. The patient is status post ACDF at C5-C6. Solid interbody fusion is seen at C5-C6. No periprosthetic lucency or fracture is identified. Soft tissues and spinal canal: The visualized paraspinal soft tissues are unremarkable. No prevertebral soft tissue swelling is seen. The spinal canal is grossly unremarkable, no large epidural collection or significant canal narrowing. Disc levels: Multilevel disc osteophyte complex with anterior osteophytes and uncovertebral osteophytes are noted. This is most notable at C3-C4 and C4-C5  with neural foraminal stenosis. Upper chest: The lung apices are clear. Thoracic inlet is within normal limits. Other: None IMPRESSION: No acute intracranial abnormality. Findings consistent with age related atrophy and chronic small vessel ischemia No acute fracture or malalignment of the spine. Status post ACDF at C5-C6 without hardware complication. Electronically Signed   By: Prudencio Pair M.D.   On: 10/31/2018 20:54   Ct Cervical Spine Wo Contrast  Result Date: 10/31/2018 CLINICAL DATA:  Fall, injury to the back of the head and back the EXAM: CT HEAD WITHOUT CONTRAST; CT CERVICAL SPINE WITHOUT CONTRAST TECHNIQUE: Contiguous axial images were obtained from the base of the skull through the vertex without intravenous contrast. COMPARISON:  None. FINDINGS: Brain: No evidence of acute territorial infarction, hemorrhage, hydrocephalus,extra-axial collection or mass lesion/mass effect. There is dilatation the ventricles and sulci consistent with age-related atrophy. Low-attenuation changes in the deep white matter consistent with small vessel ischemia. Vascular: No hyperdense vessel or unexpected calcification. Skull: The skull is intact. No fracture or focal lesion identified. Sinuses/Orbits: The visualized paranasal sinuses and mastoid air cells are clear. The orbits and globes intact. Other: None Cervical spine: Alignment: Physiologic Skull base and vertebrae: Visualized skull base is intact. No atlanto-occipital dissociation. The patient is status post ACDF at C5-C6. Solid interbody fusion is seen at C5-C6. No periprosthetic lucency or fracture is identified. Soft tissues and spinal canal: The visualized paraspinal soft tissues are unremarkable. No prevertebral soft tissue swelling is seen. The spinal canal is grossly unremarkable, no large epidural collection or significant canal narrowing. Disc levels: Multilevel disc osteophyte complex with anterior osteophytes and uncovertebral osteophytes are noted. This  is most notable at C3-C4 and C4-C5 with neural foraminal stenosis. Upper chest: The lung apices are clear. Thoracic inlet is within normal limits. Other: None IMPRESSION: No acute intracranial abnormality. Findings consistent with  age related atrophy and chronic small vessel ischemia No acute fracture or malalignment of the spine. Status post ACDF at C5-C6 without hardware complication. Electronically Signed   By: Prudencio Pair M.D.   On: 10/31/2018 20:54   Ct Lumbar Spine Wo Contrast  Result Date: 10/31/2018 CLINICAL DATA:  Back pain, trauma EXAM: CT LUMBAR SPINE WITHOUT CONTRAST TECHNIQUE: Multidetector CT imaging of the lumbar spine was performed without intravenous contrast administration. Multiplanar CT image reconstructions were also generated. COMPARISON:  None. FINDINGS: Segmentation: There are 5 non-rib bearing lumbar type vertebral bodies with the last intervertebral disc space labeled as L5-S1. Alignment: There is a mild levoconvex scoliotic curvature of the lumbar spine. Vertebrae: The vertebral body heights are well maintained. No fracture, malalignment, or pathologic osseous lesions seen. Paraspinal and other soft tissues: The paraspinal soft tissues and visualized retroperitoneal structures are unremarkable. The sacroiliac joints are intact. Scattered aortic atherosclerosis is seen. 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 IMPRESSION: No acute fracture or malalignment of the spine. Electronically Signed   By: Prudencio Pair M.D.   On: 10/31/2018 20:57    ____________________________________________  PROCEDURES   Procedure(s) performed (including Critical Care):  Procedures  ____________________________________________  INITIAL IMPRESSION / MDM / [place] / ED COURSE  As part of my medical decision  making, I reviewed the following data within the electronic MEDICAL RECORD NUMBER Notes from prior ED visits and [place] Controlled Substance Database      *KENESHIA TENA was evaluated in Emergency Department on 11/01/2018 for the symptoms described in the history of present illness. She was evaluated in the context of the global COVID-19 pandemic, which necessitated consideration that the patient might be at risk for infection with the SARS-CoV-2 virus that causes COVID-19. Institutional protocols and algorithms that pertain to the evaluation of patients at risk for COVID-19 are in a state of rapid change based on information released by regulatory bodies including the CDC and federal and state organizations. These policies and algorithms were followed during the patient's care in the ED.  Some ED evaluations and interventions may be delayed as a result of limited staffing during the pandemic.*   Clinical Course as of Oct 31 220  Tue Oct 31, 2018  2002 EKG: Atrial paced rhythm, ventricular rate 60, QRS narrow at 80.  No acute ST elevations or depressions.   [CI]    Clinical Course User Index [CI] Duffy Bruce, MD    Medical Decision Making: 78 year old female here with generalized weakness and increasing falls.  She appears grossly ataxic on exam with disequilibrium.  This could all be secondary to deconditioning, though differential also includes possible UTI though no overt sx from this, also consideration of possible occult, CVA, or possibly inner ear dysfunction given her bilateral serous effusions with air-fluid levels on the right and recurrent sinus infections.  Feels reasonable to admit for PT/OT, possible further neuroimagingg, possible ENT eval. ____________________________________________  FINAL CLINICAL IMPRESSION(S) / ED DIAGNOSES  Final diagnoses:  Fall, initial encounter  Dysequilibrium     MEDICATIONS GIVEN DURING THIS VISIT:  Medications  0.9 %  sodium chloride infusion  (has no administration in time range)  sodium chloride 0.9 % bolus 1,000 mL (1,000 mLs Intravenous New Bag/Given 10/31/18 2300)     ED Discharge Orders    None  Note:  This document was prepared using Dragon voice recognition software and may include unintentional dictation errors.   Duffy Bruce, MD 11/01/18 Derrek Gu, MD 11/07/18 [phone]

## 2018-11-01 ENCOUNTER — Observation Stay: Payer: Medicare Other

## 2018-11-01 ENCOUNTER — Other Ambulatory Visit: Payer: Self-pay

## 2018-11-01 DIAGNOSIS — G2581 Restless legs syndrome: Secondary | ICD-10-CM | POA: Diagnosis present

## 2018-11-01 DIAGNOSIS — Z888 Allergy status to other drugs, medicaments and biological substances status: Secondary | ICD-10-CM | POA: Diagnosis not present

## 2018-11-01 DIAGNOSIS — M25512 Pain in left shoulder: Secondary | ICD-10-CM | POA: Diagnosis present

## 2018-11-01 DIAGNOSIS — R270 Ataxia, unspecified: Secondary | ICD-10-CM

## 2018-11-01 DIAGNOSIS — R42 Dizziness and giddiness: Secondary | ICD-10-CM | POA: Diagnosis present

## 2018-11-01 DIAGNOSIS — Z950 Presence of cardiac pacemaker: Secondary | ICD-10-CM | POA: Diagnosis not present

## 2018-11-01 DIAGNOSIS — F329 Major depressive disorder, single episode, unspecified: Secondary | ICD-10-CM | POA: Diagnosis present

## 2018-11-01 DIAGNOSIS — Z7902 Long term (current) use of antithrombotics/antiplatelets: Secondary | ICD-10-CM | POA: Diagnosis not present

## 2018-11-01 DIAGNOSIS — R32 Unspecified urinary incontinence: Secondary | ICD-10-CM | POA: Diagnosis present

## 2018-11-01 DIAGNOSIS — Z825 Family history of asthma and other chronic lower respiratory diseases: Secondary | ICD-10-CM | POA: Diagnosis not present

## 2018-11-01 DIAGNOSIS — Z833 Family history of diabetes mellitus: Secondary | ICD-10-CM | POA: Diagnosis not present

## 2018-11-01 DIAGNOSIS — I495 Sick sinus syndrome: Secondary | ICD-10-CM | POA: Diagnosis present

## 2018-11-01 DIAGNOSIS — Z20828 Contact with and (suspected) exposure to other viral communicable diseases: Secondary | ICD-10-CM | POA: Diagnosis present

## 2018-11-01 DIAGNOSIS — I10 Essential (primary) hypertension: Secondary | ICD-10-CM | POA: Diagnosis present

## 2018-11-01 DIAGNOSIS — Z8249 Family history of ischemic heart disease and other diseases of the circulatory system: Secondary | ICD-10-CM | POA: Diagnosis not present

## 2018-11-01 DIAGNOSIS — F419 Anxiety disorder, unspecified: Secondary | ICD-10-CM | POA: Diagnosis present

## 2018-11-01 DIAGNOSIS — R260 Ataxic gait: Secondary | ICD-10-CM | POA: Diagnosis present

## 2018-11-01 DIAGNOSIS — G20 Parkinson's disease: Secondary | ICD-10-CM | POA: Diagnosis present

## 2018-11-01 DIAGNOSIS — Z8673 Personal history of transient ischemic attack (TIA), and cerebral infarction without residual deficits: Secondary | ICD-10-CM | POA: Diagnosis not present

## 2018-11-01 DIAGNOSIS — K219 Gastro-esophageal reflux disease without esophagitis: Secondary | ICD-10-CM | POA: Diagnosis present

## 2018-11-01 DIAGNOSIS — E785 Hyperlipidemia, unspecified: Secondary | ICD-10-CM | POA: Diagnosis present

## 2018-11-01 DIAGNOSIS — Z803 Family history of malignant neoplasm of breast: Secondary | ICD-10-CM | POA: Diagnosis not present

## 2018-11-01 DIAGNOSIS — R296 Repeated falls: Secondary | ICD-10-CM | POA: Diagnosis present

## 2018-11-01 DIAGNOSIS — R3915 Urgency of urination: Secondary | ICD-10-CM | POA: Diagnosis present

## 2018-11-01 DIAGNOSIS — Z7982 Long term (current) use of aspirin: Secondary | ICD-10-CM | POA: Diagnosis not present

## 2018-11-01 DIAGNOSIS — Z79899 Other long term (current) drug therapy: Secondary | ICD-10-CM | POA: Diagnosis not present

## 2018-11-01 LAB — BASIC METABOLIC PANEL
Anion gap: 10 (ref 5–15)
BUN: 17 mg/dL (ref 8–23)
CO2: 29 mmol/L (ref 22–32)
Calcium: 8.7 mg/dL — ABNORMAL LOW (ref 8.9–10.3)
Chloride: 102 mmol/L (ref 98–111)
Creatinine, Ser: 1.04 mg/dL — ABNORMAL HIGH (ref 0.44–1.00)
GFR calc Af Amer: >60 mL/min (ref >60)
GFR calc non Af Amer: 52 mL/min — ABNORMAL LOW (ref >60)
Glucose, Bld: 85 mg/dL (ref 70–99)
Potassium: 3 mmol/L — ABNORMAL LOW (ref 3.5–5.1)
Sodium: 141 mmol/L (ref 135–145)

## 2018-11-01 LAB — CBC
HCT: 36.4 % (ref 36.0–46.0)
Hemoglobin: 11.9 g/dL — ABNORMAL LOW (ref 12.0–15.0)
MCH: 30.2 pg (ref 26.0–34.0)
MCHC: 32.7 g/dL (ref 30.0–36.0)
MCV: 92.4 fL (ref 80.0–100.0)
Platelets: 159 10*3/uL (ref 150–400)
RBC: 3.94 MIL/uL (ref 3.87–5.11)
RDW: 15.3 % (ref 11.5–15.5)
WBC: 7.2 10*3/uL (ref 4.0–10.5)
nRBC: 0 % (ref 0.0–0.2)

## 2018-11-01 LAB — SARS CORONAVIRUS 2 BY RT PCR (HOSPITAL ORDER, PERFORMED IN ~~LOC~~ HOSPITAL LAB): SARS Coronavirus 2: NEGATIVE

## 2018-11-01 LAB — VITAMIN D 25 HYDROXY (VIT D DEFICIENCY, FRACTURES): Vit D, 25-Hydroxy: 40.58 ng/mL (ref 30–100)

## 2018-11-01 LAB — VITAMIN B12: Vitamin B-12: 1068 pg/mL — ABNORMAL HIGH (ref 180–914)

## 2018-11-01 LAB — CULTURE, URINE COMPREHENSIVE

## 2018-11-01 MED ORDER — CLONAZEPAM 0.5 MG PO TABS
0.5000 mg | ORAL_TABLET | Freq: Every day | ORAL | Status: DC
  Administered 2018-11-01 – 2018-11-03 (×3): 0.5 mg via ORAL
  Filled 2018-11-01 (×3): qty 1

## 2018-11-01 MED ORDER — TRIAMTERENE-HCTZ 37.5-25 MG PO TABS
0.5000 | ORAL_TABLET | Freq: Every day | ORAL | Status: DC
  Administered 2018-11-01 – 2018-11-03 (×3): 0.5 via ORAL
  Filled 2018-11-01 (×4): qty 0.5

## 2018-11-01 MED ORDER — ACETAMINOPHEN 650 MG RE SUPP: 650.0000 mg | Freq: Four times a day (QID) | RECTAL | Status: DC | PRN

## 2018-11-01 MED ORDER — ROPINIROLE HCL 1 MG PO TABS
1.0000 mg | ORAL_TABLET | Freq: Every day | ORAL | Status: DC
  Administered 2018-11-01 – 2018-11-03 (×3): 1 mg via ORAL
  Filled 2018-11-01 (×3): qty 1

## 2018-11-01 MED ORDER — ACETAMINOPHEN 325 MG PO TABS: 650.0000 mg | ORAL_TABLET | Freq: Four times a day (QID) | ORAL | Status: DC | PRN

## 2018-11-01 MED ORDER — CLOPIDOGREL BISULFATE 75 MG PO TABS
75.0000 mg | ORAL_TABLET | Freq: Every day | ORAL | Status: DC
  Administered 2018-11-01 – 2018-11-04 (×4): 75 mg via ORAL
  Filled 2018-11-01 (×4): qty 1

## 2018-11-01 MED ORDER — ASPIRIN EC 81 MG PO TBEC
81.0000 mg | DELAYED_RELEASE_TABLET | Freq: Every day | ORAL | Status: DC
  Administered 2018-11-01 – 2018-11-04 (×4): 81 mg via ORAL
  Filled 2018-11-01 (×4): qty 1

## 2018-11-01 MED ORDER — HYDROXYZINE HCL 50 MG PO TABS
100.0000 mg | ORAL_TABLET | Freq: Every evening | ORAL | Status: DC | PRN
  Filled 2018-11-01: qty 2

## 2018-11-01 MED ORDER — BRAN FIBER 500 MG PO TABS: 500.0000 mg | ORAL_TABLET | ORAL | Status: DC

## 2018-11-01 MED ORDER — PANTOPRAZOLE SODIUM 40 MG PO TBEC
40.0000 mg | DELAYED_RELEASE_TABLET | Freq: Every day | ORAL | Status: DC
  Administered 2018-11-01 – 2018-11-04 (×4): 40 mg via ORAL
  Filled 2018-11-01 (×4): qty 1

## 2018-11-01 MED ORDER — DOCUSATE SODIUM 100 MG PO CAPS
100.0000 mg | ORAL_CAPSULE | Freq: Two times a day (BID) | ORAL | Status: DC
  Administered 2018-11-01 – 2018-11-04 (×7): 100 mg via ORAL
  Filled 2018-11-01 (×7): qty 1

## 2018-11-01 MED ORDER — MELATONIN 5 MG PO TABS
10.0000 mg | ORAL_TABLET | Freq: Every day | ORAL | Status: DC
  Administered 2018-11-01 – 2018-11-03 (×3): 10 mg via ORAL
  Filled 2018-11-01 (×4): qty 2

## 2018-11-01 MED ORDER — MIRABEGRON ER 50 MG PO TB24
50.0000 mg | ORAL_TABLET | Freq: Every day | ORAL | Status: DC
  Administered 2018-11-01 – 2018-11-02 (×2): 50 mg via ORAL
  Filled 2018-11-01 (×3): qty 1

## 2018-11-01 MED ORDER — PRAVASTATIN SODIUM 20 MG PO TABS
40.0000 mg | ORAL_TABLET | Freq: Every evening | ORAL | Status: DC
  Administered 2018-11-01 – 2018-11-03 (×3): 40 mg via ORAL
  Filled 2018-11-01 (×3): qty 2

## 2018-11-01 MED ORDER — POLYETHYLENE GLYCOL 3350 17 G PO PACK
17.0000 g | PACK | Freq: Every day | ORAL | Status: DC
  Administered 2018-11-02: 10:00:00 17 g via ORAL
  Filled 2018-11-01 (×3): qty 1

## 2018-11-01 MED ORDER — CARBIDOPA-LEVODOPA ER 50-200 MG PO TBCR
1.0000 | EXTENDED_RELEASE_TABLET | Freq: Every day | ORAL | Status: DC
  Administered 2018-11-01 – 2018-11-03 (×3): 1 via ORAL
  Filled 2018-11-01 (×4): qty 1

## 2018-11-01 MED ORDER — ONDANSETRON HCL 4 MG PO TABS: 4.0000 mg | ORAL_TABLET | Freq: Four times a day (QID) | ORAL | Status: DC | PRN

## 2018-11-01 MED ORDER — ONDANSETRON HCL 4 MG/2ML IJ SOLN: 4.0000 mg | Freq: Four times a day (QID) | INTRAMUSCULAR | Status: DC | PRN

## 2018-11-01 MED ORDER — VITAMIN D 25 MCG (1000 UNIT) PO TABS
1000.0000 [IU] | ORAL_TABLET | Freq: Every day | ORAL | Status: DC
  Administered 2018-11-01 – 2018-11-04 (×4): 1000 [IU] via ORAL
  Filled 2018-11-01 (×4): qty 1

## 2018-11-01 MED ORDER — CITALOPRAM HYDROBROMIDE 20 MG PO TABS
40.0000 mg | ORAL_TABLET | Freq: Every day | ORAL | Status: DC
  Administered 2018-11-01 – 2018-11-04 (×4): 40 mg via ORAL
  Filled 2018-11-01 (×3): qty 2

## 2018-11-01 MED ORDER — TRAZODONE HCL 50 MG PO TABS
200.0000 mg | ORAL_TABLET | Freq: Every day | ORAL | Status: DC
  Administered 2018-11-01 – 2018-11-03 (×3): 200 mg via ORAL
  Filled 2018-11-01 (×3): qty 4

## 2018-11-01 MED ORDER — ENOXAPARIN SODIUM 40 MG/0.4ML ~~LOC~~ SOLN
40.0000 mg | SUBCUTANEOUS | Status: DC
  Administered 2018-11-01 – 2018-11-04 (×4): 40 mg via SUBCUTANEOUS
  Filled 2018-11-01 (×4): qty 0.4

## 2018-11-01 MED ORDER — VITAMIN B-12 1000 MCG PO TABS
1000.0000 ug | ORAL_TABLET | Freq: Every day | ORAL | Status: DC
  Administered 2018-11-01 – 2018-11-04 (×4): 1000 ug via ORAL
  Filled 2018-11-01 (×4): qty 1

## 2018-11-01 MED ORDER — BUPROPION HCL ER (XL) 150 MG PO TB24
300.0000 mg | ORAL_TABLET | Freq: Every day | ORAL | Status: DC
  Administered 2018-11-01 – 2018-11-04 (×4): 300 mg via ORAL
  Filled 2018-11-01 (×4): qty 2

## 2018-11-01 MED ORDER — POTASSIUM CHLORIDE CRYS ER 20 MEQ PO TBCR
40.0000 meq | EXTENDED_RELEASE_TABLET | ORAL | Status: AC
  Administered 2018-11-01: 40 meq via ORAL
  Filled 2018-11-01: qty 2

## 2018-11-01 MED ORDER — CARBIDOPA-LEVODOPA 25-100 MG PO TABS
1.0000 | ORAL_TABLET | Freq: Three times a day (TID) | ORAL | Status: DC
  Administered 2018-11-01 – 2018-11-04 (×8): 1 via ORAL
  Filled 2018-11-01 (×11): qty 1

## 2018-11-01 MED ORDER — HYDROCODONE-ACETAMINOPHEN 7.5-325 MG PO TABS
1.0000 | ORAL_TABLET | Freq: Four times a day (QID) | ORAL | Status: DC | PRN
  Administered 2018-11-01 – 2018-11-04 (×2): 1 via ORAL
  Filled 2018-11-01 (×2): qty 1

## 2018-11-01 MED ORDER — METOPROLOL SUCCINATE ER 25 MG PO TB24
12.5000 mg | ORAL_TABLET | Freq: Every day | ORAL | Status: DC
  Administered 2018-11-01 – 2018-11-03 (×3): 12.5 mg via ORAL
  Filled 2018-11-01 (×4): qty 1

## 2018-11-01 NOTE — Progress Notes (Signed)
Advanced care plan.  Purpose of the Encounter: CODE STATUS  Parties in Attendance: Patient herself  Patient's Decision Capacity: Intact  Subjective/Patient's story: Maria Horton  is a 78 y.o. female history of anxiety GERD hypertension who presents with chief complaint as above.  Patient presents the ED with a complaint of multiple falls at home.  She states that she has been having sensation of ataxia.    Objective/Medical story I discussed with the patient regarding her desires for cardiac and pulmonary resuscitation.   Goals of care determination:  Patient states that she wants to be a limited code and does not want to be intubated   CODE STATUS: Limited code   Time spent discussing advanced care planning: 16 minutes

## 2018-11-01 NOTE — H&P (Signed)
[facility] [place] NAME: Maria Horton    MR#:  [id number]  DATE OF BIRTH:  [birth year]  DATE OF ADMISSION:  10/31/2018  PRIMARY CARE PHYSICIAN: Baxter Hire, MD   REQUESTING/REFERRING PHYSICIAN: Ellender Hose, MD  CHIEF COMPLAINT:   Chief Complaint  Patient presents with  . Fall    HISTORY OF PRESENT ILLNESS:  Maria Horton  is a 78 y.o. female who presents with chief complaint as above.  Patient presents the ED with a complaint of multiple falls at home.  She states that she has been having sensation of ataxia.  She denies any true vertigo, but does state that she feels off balance when she gets up.  She has been told she has some "fluid" in her right ear.  She has been working with physical therapy at home, but has not felt much improvement.  Initial evaluation here in the ED is largely within normal limits.  Hospitalist called for admission and further evaluation  PAST MEDICAL HISTORY:   Past Medical History:  Diagnosis Date  . Anxiety   . Chronic back pain   . Constipation   . Depression   . Diverticulosis   . Fibromyalgia   . Gastroparesis   . GERD (gastroesophageal reflux disease)   . Hypertension   . Hypoglycemia   . Hypokalemia   . Osteoarthritis   . PONV (postoperative nausea and vomiting)   . Reflux   . Stroke ([place])   . TIA (transient ischemic attack)   . Vascular dementia ([place])   . Vitamin D deficiency      PAST SURGICAL HISTORY:   Past Surgical History:  Procedure Laterality Date  . ABDOMINAL HYSTERECTOMY    . ABDOMINOPLASTY    . BREAST BIOPSY Left 2005   bengin, with clip  . BUNIONECTOMY Bilateral   . CHOLECYSTECTOMY    . COLON SURGERY    . FLEXIBLE BRONCHOSCOPY Bilateral 01/11/2018   Procedure: FLEXIBLE BRONCHOSCOPY;  Surgeon: Ottie Glazier, MD;  Location: [facility];  Service: Thoracic;  Laterality: Bilateral;  . LEFT HEART CATH AND CORONARY ANGIOGRAPHY Left 10/26/2017   Procedure: LEFT HEART  CATH AND CORONARY ANGIOGRAPHY;  Surgeon: Isaias Cowman, MD;  Location: [facility];  Service: Cardiovascular;  Laterality: Left;  . NECK SURGERY    . PACEMAKER INSERTION N/A 12/14/2016   Procedure: INSERTION PACEMAKER;  Surgeon: Isaias Cowman, MD;  Location: [facility];  Service: Cardiovascular;  Laterality: N/A;  . ROTATOR CUFF REPAIR    . TONSILLECTOMY       SOCIAL HISTORY:   Social History   Tobacco Use  . Smoking status: Never Smoker  . Smokeless tobacco: Never Used  Substance Use Topics  . Alcohol use: No     FAMILY HISTORY:   Family History  Problem Relation Age of Onset  . Hypertension Mother   . Breast cancer Sister 24     DRUG ALLERGIES:   Allergies  Allergen Reactions  . 5-Alpha Reductase Inhibitors   . Ace Inhibitors Other (See Comments)    achy  . Beta Adrenergic Blockers Other (See Comments)    Makes achy/feel sick  . Calcium Channel Blockers Other (See Comments)    Feels achy like sick  . Latex Rash  . Tape Rash    MEDICATIONS AT HOME:   Prior to Admission medications   Medication Sig Start Date End Date Taking? Authorizing Provider  hydrOXYzine (ATARAX/VISTARIL) 25 MG tablet Take 25-50 mg by mouth at  bedtime as needed (restless leg).   Yes [provider]  amitriptyline (ELAVIL) 10 MG tablet Take 10 mg by mouth at bedtime.  10/04/18   [provider]  aspirin EC 81 MG EC tablet Take 1 tablet (81 mg total) by mouth daily. 11/18/16   Dustin Flock, MD  b complex vitamins tablet Take 1 tablet daily by mouth.    [provider]  buPROPion (WELLBUTRIN XL) 300 MG 24 hr tablet Take 300 mg by mouth daily.  09/09/18   [provider]  carbidopa-levodopa (SINEMET CR) 50-200 MG tablet Take 1 tablet by mouth daily.     [provider]  citalopram (CELEXA) 40 MG tablet Take 40 mg daily by mouth.     [provider]  clonazePAM (KLONOPIN) 0.5 MG tablet Take 1.5 tablets (0.75 mg total) by  mouth at bedtime. Patient taking differently: Take 0.5 mg by mouth at bedtime.  10/01/15   Pucilowska, Herma Ard B, MD  clopidogrel (PLAVIX) 75 MG tablet Take 75 mg by mouth daily. 09/21/16   [provider]  esomeprazole (NEXIUM) 40 MG capsule Take 40 mg by mouth 2 (two) times daily.    [provider]  metoprolol succinate (TOPROL-XL) 25 MG 24 hr tablet Take 25 mg by mouth daily. 08/30/17   [provider]  mirabegron ER (MYRBETRIQ) 50 MG TB24 tablet Take 1 tablet (50 mg total) by mouth daily. 10/30/18   Bjorn Loser, MD  pravastatin (PRAVACHOL) 40 MG tablet Take 1 tablet (40 mg total) by mouth every evening. 11/17/16   Dustin Flock, MD  rOPINIRole (REQUIP) 0.5 MG tablet Take 1 mg by mouth at bedtime.     [provider]  traZODone (DESYREL) 100 MG tablet Take 200 mg by mouth at bedtime.     [provider]  triamterene-hydrochlorothiazide (MAXZIDE-25) 37.5-25 MG tablet Take 1 tablet by mouth daily.     [provider]  vitamin B-12 (CYANOCOBALAMIN) 1000 MCG tablet Take 1,000 mcg by mouth daily.    [provider]    REVIEW OF SYSTEMS:  Review of Systems  Constitutional: Negative for chills, fever, malaise/fatigue and weight loss.  HENT: Negative for ear pain, hearing loss and tinnitus.   Eyes: Negative for blurred vision, double vision, pain and redness.  Respiratory: Negative for cough, hemoptysis and shortness of breath.   Cardiovascular: Negative for chest pain, palpitations, orthopnea and leg swelling.  Gastrointestinal: Negative for abdominal pain, constipation, diarrhea, nausea and vomiting.  Genitourinary: Negative for dysuria, frequency and hematuria.  Musculoskeletal: Positive for falls. Negative for back pain, joint pain and neck pain.  Skin:       No acne, rash, or lesions  Neurological: Negative for dizziness, tremors, focal weakness and weakness.       Ataxia  Endo/Heme/Allergies: Negative for polydipsia. Does  not bruise/bleed easily.  Psychiatric/Behavioral: Negative for depression. The patient is not nervous/anxious and does not have insomnia.      VITAL SIGNS:   Vitals:   10/31/18 1734 10/31/18 2200 10/31/18 2230 10/31/18 2300  BP: 102/65 115/70 (!) 115/46 120/61  Pulse:  61 60 (!) 51  Resp:      Temp:      TempSrc:      SpO2:  93% 96% 93%  Height:       Wt Readings from Last 3 Encounters:  10/30/18 68 kg  09/11/18 68 kg  04/06/18 68 kg    PHYSICAL EXAMINATION:  Physical Exam  Vitals reviewed. Constitutional: She is oriented  to person, place, and time. She appears well-developed and well-nourished. No distress.  HENT:  Head: Normocephalic and atraumatic.  Mouth/Throat: Oropharynx is clear and moist.  Eyes: Pupils are equal, round, and reactive to light. Conjunctivae and EOM are normal. No scleral icterus.  Neck: Normal range of motion. Neck supple. No JVD present. No thyromegaly present.  Cardiovascular: Normal rate, regular rhythm and intact distal pulses. Exam reveals no gallop and no friction rub.  No murmur heard. Respiratory: Effort normal and breath sounds normal. No respiratory distress. She has no wheezes. She has no rales.  GI: Soft. Bowel sounds are normal. She exhibits no distension. There is no abdominal tenderness.  Musculoskeletal: Normal range of motion.        General: No edema.     Comments: No arthritis, no gout  Lymphadenopathy:    She has no cervical adenopathy.  Neurological: She is alert and oriented to person, place, and time. No cranial nerve deficit.  Patient is unsteady on her feet when walking.  She is unable to walk sufficiently to truly fully evaluate her gait.  No other acute focal neurological deficits  Skin: Skin is warm and dry. No rash noted. No erythema.  Psychiatric: She has a normal mood and affect. Her behavior is normal. Judgment and thought content normal.    LABORATORY PANEL:   CBC Recent Labs  Lab 10/31/18 1950  WBC 8.0  HGB  13.4  HCT 40.9  PLT 180   ------------------------------------------------------------------------------------------------------------------  Chemistries  Recent Labs  Lab 10/31/18 1950  NA 141  K 3.4*  CL 99  CO2 29  GLUCOSE 94  BUN 17  CREATININE 1.21*  CALCIUM 9.5   ------------------------------------------------------------------------------------------------------------------  Cardiac Enzymes No results for input(s): TROPONINI in the last 168 hours. ------------------------------------------------------------------------------------------------------------------  RADIOLOGY:  Ct Head Wo Contrast  Result Date: 10/31/2018 CLINICAL DATA:  Fall, injury to the back of the head and back the EXAM: CT HEAD WITHOUT CONTRAST; CT CERVICAL SPINE WITHOUT CONTRAST TECHNIQUE: Contiguous axial images were obtained from the base of the skull through the vertex without intravenous contrast. COMPARISON:  None. FINDINGS: Brain: No evidence of acute territorial infarction, hemorrhage, hydrocephalus,extra-axial collection or mass lesion/mass effect. There is dilatation the ventricles and sulci consistent with age-related atrophy. Low-attenuation changes in the deep white matter consistent with small vessel ischemia. Vascular: No hyperdense vessel or unexpected calcification. Skull: The skull is intact. No fracture or focal lesion identified. Sinuses/Orbits: The visualized paranasal sinuses and mastoid air cells are clear. The orbits and globes intact. Other: None Cervical spine: Alignment: Physiologic Skull base and vertebrae: Visualized skull base is intact. No atlanto-occipital dissociation. The patient is status post ACDF at C5-C6. Solid interbody fusion is seen at C5-C6. No periprosthetic lucency or fracture is identified. Soft tissues and spinal canal: The visualized paraspinal soft tissues are unremarkable. No prevertebral soft tissue swelling is seen. The spinal canal is grossly unremarkable, no  large epidural collection or significant canal narrowing. Disc levels: Multilevel disc osteophyte complex with anterior osteophytes and uncovertebral osteophytes are noted. This is most notable at C3-C4 and C4-C5 with neural foraminal stenosis. Upper chest: The lung apices are clear. Thoracic inlet is within normal limits. Other: None IMPRESSION: No acute intracranial abnormality. Findings consistent with age related atrophy and chronic small vessel ischemia No acute fracture or malalignment of the spine. Status post ACDF at C5-C6 without hardware complication. Electronically Signed   By: Prudencio Pair M.D.   On: 10/31/2018 20:54   Ct Cervical Spine Wo Contrast  Result Date: 10/31/2018 CLINICAL DATA:  Fall, injury to the back of the head and back the EXAM: CT HEAD WITHOUT CONTRAST; CT CERVICAL SPINE WITHOUT CONTRAST TECHNIQUE: Contiguous axial images were obtained from the base of the skull through the vertex without intravenous contrast. COMPARISON:  None. FINDINGS: Brain: No evidence of acute territorial infarction, hemorrhage, hydrocephalus,extra-axial collection or mass lesion/mass effect. There is dilatation the ventricles and sulci consistent with age-related atrophy. Low-attenuation changes in the deep white matter consistent with small vessel ischemia. Vascular: No hyperdense vessel or unexpected calcification. Skull: The skull is intact. No fracture or focal lesion identified. Sinuses/Orbits: The visualized paranasal sinuses and mastoid air cells are clear. The orbits and globes intact. Other: None Cervical spine: Alignment: Physiologic Skull base and vertebrae: Visualized skull base is intact. No atlanto-occipital dissociation. The patient is status post ACDF at C5-C6. Solid interbody fusion is seen at C5-C6. No periprosthetic lucency or fracture is identified. Soft tissues and spinal canal: The visualized paraspinal soft tissues are unremarkable. No prevertebral soft tissue swelling is seen. The spinal  canal is grossly unremarkable, no large epidural collection or significant canal narrowing. Disc levels: Multilevel disc osteophyte complex with anterior osteophytes and uncovertebral osteophytes are noted. This is most notable at C3-C4 and C4-C5 with neural foraminal stenosis. Upper chest: The lung apices are clear. Thoracic inlet is within normal limits. Other: None IMPRESSION: No acute intracranial abnormality. Findings consistent with age related atrophy and chronic small vessel ischemia No acute fracture or malalignment of the spine. Status post ACDF at C5-C6 without hardware complication. Electronically Signed   By: Prudencio Pair M.D.   On: 10/31/2018 20:54   Ct Lumbar Spine Wo Contrast  Result Date: 10/31/2018 CLINICAL DATA:  Back pain, trauma EXAM: CT LUMBAR SPINE WITHOUT CONTRAST TECHNIQUE: Multidetector CT imaging of the lumbar spine was performed without intravenous contrast administration. Multiplanar CT image reconstructions were also generated. COMPARISON:  None. FINDINGS: Segmentation: There are 5 non-rib bearing lumbar type vertebral bodies with the last intervertebral disc space labeled as L5-S1. Alignment: There is a mild levoconvex scoliotic curvature of the lumbar spine. Vertebrae: The vertebral body heights are well maintained. No fracture, malalignment, or pathologic osseous lesions seen. Paraspinal and other soft tissues: The paraspinal soft tissues and visualized retroperitoneal structures are unremarkable. The sacroiliac joints are intact. Scattered aortic atherosclerosis is seen. 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 IMPRESSION: No acute fracture or malalignment of the spine. Electronically Signed   By: Prudencio Pair M.D.   On: 10/31/2018 20:57    EKG:   Orders placed or performed during the hospital encounter  of 10/31/18  . EKG 12-Lead  . EKG 12-Lead  . ED EKG  . ED EKG    IMPRESSION AND PLAN:  Principal Problem:   Ataxia -unclear etiology.  We will start evaluation with an MRI of her brain to evaluate for stroke or other intracranial pathology.  If this is negative could consider ENT and/or neurology consults Active Problems:   Hypertension -home dose antihypertensives   GERD (gastroesophageal reflux disease) -home dose PPI   Sick sinus syndrome [facility]) -patient has pacemaker, though it is MRI compatible per pacemaker insertion note by Dr. Saralyn Pilar in November 2018.  Chart review performed and case discussed with ED provider. Labs, imaging and/or ECG reviewed by provider and discussed with patient/family. Management plans discussed with the  patient and/or family.  COVID-19 status: Pending  DVT PROPHYLAXIS: SubQ lovenox   GI PROPHYLAXIS:  PPI   ADMISSION STATUS: Observation     CODE STATUS: Full Code Status History    Date Active Date Inactive Code Status Order ID Comments User Context   10/26/2017 0825 10/26/2017 1220 Full Code [id number]  Isaias Cowman, MD Inpatient   12/14/2016 1543 12/15/2016 1728 Full Code [id number]  Isaias Cowman, MD Inpatient   11/14/2016 2145 11/17/2016 2020 Full Code [id number]  Vaughan Basta, MD ED   09/10/2016 1617 09/11/2016 1850 Full Code [id number]  Dustin Flock, MD Inpatient   09/26/2015 2050 10/01/2015 1857 Full Code [id number]  Clapacs, Madie Reno, MD Inpatient   Advance Care Planning Activity      TOTAL TIME TAKING CARE OF THIS PATIENT: 40 minutes.   This patient was evaluated in the context of the global COVID-19 pandemic, which necessitated consideration that the patient might be at risk for infection with the SARS-CoV-2 virus that causes COVID-19. Institutional protocols and algorithms that pertain to the evaluation of patients at risk for COVID-19 are in a state of rapid change based on information released by regulatory bodies  including the CDC and federal and state organizations. These policies and algorithms were followed to the best of this provider's knowledge to date during the patient's care at this facility.  Ethlyn Daniels 11/01/2018, 1:00 AM  [facility]  Office  [phone]  CC: Primary care physician; Baxter Hire, MD  Note:  This document was prepared using Dragon voice recognition software and may include unintentional dictation errors.

## 2018-11-01 NOTE — Consult Note (Signed)
PHARMACIST - PHYSICIAN ORDER COMMUNICATION  CONCERNING: P&T Medication Policy on Herbal Medications  DESCRIPTION:  This patient's order for:  Bran Fiber tablets  has been noted.  This product(s) is classified as an "herbal" or natural product. Due to a lack of definitive safety studies or FDA approval, nonstandard manufacturing practices, plus the potential risk of unknown drug-drug interactions while on inpatient medications, the Pharmacy and Therapeutics Committee does not permit the use of "herbal" or natural products of this type within [facility].   ACTION TAKEN: The pharmacy department is unable to verify this order at this time and your patient has been informed of this safety policy. Please reevaluate patient's clinical condition at discharge and address if the herbal or natural product(s) should be resumed at that time.

## 2018-11-01 NOTE — Plan of Care (Signed)
  Problem: Activity: Goal: Risk for activity intolerance will decrease Outcome: Progressing   

## 2018-11-01 NOTE — Evaluation (Signed)
Physical Therapy Evaluation Patient Details Name: Maria Horton MRN: [id number] DOB: Jul 12, 1940 Today's Date: 11/01/2018   History of Present Illness  presented to ER secondary to ataxia, gait imbalance with multiple recent falls; admited for TIA/CVA work up.  Head CT, spinal imaging, L shoulder and R hip imaging all negative for acute injury. 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  Clinical Impression  Upon evaluation, patient alert and oriented to basic information, but intermittently confused to complex information or assessment tasks.  Reports mild soreness in L shoulder, but denies pain otherwise.  Bilat UE/LE strength grossly 4-/5 throughout; muted sensory awareness to L UE; significant ataxia, dysmetria throughout all extremities. Currently requiring min assist for bed mobility; min/mod assist for sit/stand, basic transfers and gait (4' x2) with single UE support.  Demonstrates inconsistent step height/length and BOS; poor balance, increased sway all planes; unsafe to attempt without hands-on assist at all times.   Attempted simple visual/vestibular screening.  Patient with difficulty following commands and fully completing tasks for adequate assessment. No gross resting nystagmus noted.  Will continue assessment in subsequent sessions as appropriate. Would benefit from skilled PT to address above deficits and promote optimal return to PLOF; recommend transition to acute inpatient rehab upon discharge for high-intensity, post-acute rehab services.       Follow Up Recommendations CIR    Equipment Recommendations       Recommendations for Other Services       Precautions / Restrictions Precautions Precautions: Fall;ICD/Pacemaker Restrictions Weight Bearing Restrictions: No      Mobility  Bed Mobility Overal bed mobility: Needs Assistance Bed Mobility: Supine to Sit;Sit to Supine     Supine to sit: Min assist Sit to supine: Supervision      Transfers Overall transfer  level: Needs assistance Equipment used: 1 person hand held assist Transfers: Sit to/from [employer] Transfers Sit to Stand: Min assist;Mod assist         General transfer comment: significant ataxia throughout all extremities; poor balance, all planes  Ambulation/Gait Ambulation/Gait assistance: Min assist;Mod assist Gait Distance (Feet): 4 Feet(x2) Assistive device: 1 person hand held assist       General Gait Details: inconsistent step height/length and BOS; poor balance, increased sway all planes; unsafe to attempt without hands-on assist at all times  Stairs            Wheelchair Mobility    Modified Rankin (Stroke Patients Only)       Balance Overall balance assessment: Needs assistance Sitting-balance support: No upper extremity supported;Feet supported Sitting balance-Leahy Scale: Fair     Standing balance support: Bilateral upper extremity supported Standing balance-Leahy Scale: Poor                               Pertinent Vitals/Pain Pain Assessment: Faces Faces Pain Scale: Hurts little more Pain Location: L shoulder Pain Descriptors / Indicators: Sore Pain Intervention(s): Limited activity within patient's tolerance;Monitored during session;Repositioned    Home Living Family/patient expects to be discharged to:: Private residence Living Arrangements: Alone Available Help at Discharge: Family;Available PRN/intermittently Type of Home: House Home Access: Stairs to enter Entrance Stairs-Rails: None Entrance Stairs-Number of Steps: 3 Home Layout: One level Home Equipment: Cane - single point      Prior Function Level of Independence: Independent with assistive device(s)         Comments: Mod indep with RW for ADLs, household mobilization; does endorse multiple fall  history (at least 6 in previous six months).  Was actively participation with HHPT     Hand Dominance        Extremity/Trunk Assessment   Upper  Extremity Assessment Upper Extremity Assessment: (grossly 4/5 throughout; significant ataxia, dysmetria bilat (L > R); does endorse muted sensation throughout L UE)    Lower Extremity Assessment Lower Extremity Assessment: (grossly 4-/5 throughout; significant ataxia noted; denies sensory deficit)       Communication   Communication: No difficulties  Cognition Arousal/Alertness: Awake/alert Behavior During Therapy: WFL for tasks assessed/performed Overall Cognitive Status: No family/caregiver present to determine baseline cognitive functioning                                 General Comments: oriented to self and basic information; follows simple commands; generally confused to details of situation and history      General Comments      Exercises Other Exercises Other Exercises: Toilet transfer, SPT with single UE support, min/mod assist for balance/safety Other Exercises: Attempted simple visual/vestibular screening.  Patient with difficulty following commands and fully completing tasks for adequate assessment. No gross resting nystagmus noted.  Will continue assessment in subsequent sessions as appropriate. Other Exercises: Of note, also noting intermittent twitching, facial movements (of mouth, nose and eyes) throughout session.   Assessment/Plan    PT Assessment Patient needs continued PT services  PT Problem List Decreased strength;Decreased range of motion;Decreased activity tolerance;Decreased balance;Decreased mobility;Decreased coordination;Decreased cognition;Decreased knowledge of use of DME;Decreased safety awareness;Decreased knowledge of precautions;Impaired sensation       PT Treatment Interventions DME instruction;Stair training;Therapeutic activities;Balance training;Cognitive remediation;Gait training;Functional mobility training;Therapeutic exercise;Neuromuscular re-education;Patient/family education    PT Goals (Current goals can be found in the  Care Plan section)  Acute Rehab PT Goals Patient Stated Goal: to get stronger and reduce falls PT Goal Formulation: With patient Time For Goal Achievement: 11/15/18 Potential to Achieve Goals: Good    Frequency Min 2X/week   Barriers to discharge Decreased caregiver support      Co-evaluation               AM-PAC PT "6 Clicks" Mobility  Outcome Measure Help needed turning from your back to your side while in a flat bed without using bedrails?: A Little Help needed moving from lying on your back to sitting on the side of a flat bed without using bedrails?: A Little Help needed moving to and from a bed to a chair (including a wheelchair)?: A Little Help needed standing up from a chair using your arms (e.g., wheelchair or bedside chair)?: A Lot Help needed to walk in hospital room?: A Lot Help needed climbing 3-5 steps with a railing? : Total 6 Click Score: 14    End of Session   Activity Tolerance: Patient tolerated treatment well;Patient limited by fatigue Patient left: in bed;with call bell/phone within reach;with bed alarm set Nurse Communication: Mobility status PT Visit Diagnosis: Muscle weakness (generalized) (M62.81);Difficulty in walking, not elsewhere classified (R26.2);Unsteadiness on feet (R26.81);Ataxic gait (R26.0);Repeated falls (R29.6)    Time: 4259-5638 PT Time Calculation (min) (ACUTE ONLY): 24 min   Charges:   PT Evaluation $PT Eval Moderate Complexity: 1 Mod PT Treatments $Therapeutic Activity: 8-22 mins       Mehtab Dolberry H. Owens Shark, PT, DPT, NCS 11/01/18, 11:16 PM [phone]

## 2018-11-01 NOTE — Progress Notes (Addendum)
[employer] - [place] [facility]                                                                                                                                                                                  Patient Demographics   Maria Horton, is a 78 y.o. female, DOB - Oct 29, 1940, WUJ:[id number]  Admit date - 10/31/2018   Admitting Physician Oralia Manis, MD  Outpatient Primary MD for the patient is Gracelyn Nurse, MD   LOS - 0  Subjective: Patient admitted with ataxia unstable gait.  Review of Systems:   CONSTITUTIONAL: No documented fever. No fatigue, weakness. No weight gain, no weight loss.  EYES: No blurry or double vision.  ENT: No tinnitus. No postnasal drip. No redness of the oropharynx.  RESPIRATORY: No cough, no wheeze, no hemoptysis. No dyspnea.  CARDIOVASCULAR: No chest pain. No orthopnea. No palpitations. No syncope.  GASTROINTESTINAL: No nausea, no vomiting or diarrhea. No abdominal pain. No melena or hematochezia.  GENITOURINARY: No dysuria or hematuria.  ENDOCRINE: No polyuria or nocturia. No heat or cold intolerance.  HEMATOLOGY: No anemia. No bruising. No bleeding.  INTEGUMENTARY: No rashes. No lesions.  MUSCULOSKELETAL: No arthritis. No swelling. No gout.  NEUROLOGIC: No numbness, tingling, positive ataxia. No seizure-type activity.  PSYCHIATRIC: No anxiety. No insomnia. No ADD.    Vitals:   Vitals:   10/31/18 2230 10/31/18 2300 11/01/18 0204 11/01/18 0228  BP: (!) 115/46 120/61 127/80 111/62  Pulse: 60 (!) 51 74 61  Resp:   16 18  Temp:   98.7 F (37.1 C) 99.8 F (37.7 C)  TempSrc:   Oral   SpO2: 96% 93% 99% 97%  Weight:    65.7 kg  Height:     (1.626 m)    Wt Readings from Last 3 Encounters:  11/01/18 65.7 kg  10/30/18 68 kg  09/11/18 68 kg     Intake/Output Summary (Last 24 hours) at 11/01/2018 1428 Last data filed at 11/01/2018 1300 Gross per 24 hour  Intake 720 ml  Output -  Net 720 ml    Physical Exam:    GENERAL: Pleasant-appearing in no apparent distress.  HEAD, EYES, EARS, NOSE AND THROAT: Atraumatic, normocephalic. Extraocular muscles are intact. Pupils equal and reactive to light. Sclerae anicteric. No conjunctival injection. No oro-pharyngeal erythema.  NECK: Supple. There is no jugular venous distention. No bruits, no lymphadenopathy, no thyromegaly.  HEART: Regular rate and rhythm,. No murmurs, no rubs, no clicks.  LUNGS: Clear to auscultation bilaterally. No rales or rhonchi. No wheezes.  ABDOMEN: Soft, flat, nontender, nondistended. Has good bowel sounds. No hepatosplenomegaly appreciated.  EXTREMITIES: No evidence of any cyanosis, clubbing,  or peripheral edema.  +2 pedal and radial pulses bilaterally.  NEUROLOGIC: The patient is alert, awake, and oriented x3 generalized weakness in both lower extremities SKIN: Moist and warm with no rashes appreciated.  Psych: Not anxious, depressed LN: No inguinal LN enlargement    Antibiotics   Anti-infectives (From admission, onward)   None      Medications   Scheduled Meds: . aspirin EC  81 mg Oral Daily  . buPROPion  300 mg Oral Daily  . carbidopa-levodopa  1 tablet Oral QHS  . carbidopa-levodopa  1 tablet Oral TID AC  . cholecalciferol  1,000 Units Oral Daily  . citalopram  40 mg Oral Daily  . clonazePAM  0.5 mg Oral QHS  . clopidogrel  75 mg Oral Daily  . docusate sodium  100 mg Oral BID  . enoxaparin (LOVENOX) injection  40 mg Subcutaneous Q24H  . Melatonin  10 mg Oral QHS  . metoprolol succinate  12.5 mg Oral Daily  . mirabegron ER  50 mg Oral QHS  . pantoprazole  40 mg Oral Daily  . polyethylene glycol  17 g Oral Daily  . potassium chloride  40 mEq Oral Q4H  . pravastatin  40 mg Oral QPM  . rOPINIRole  1 mg Oral QHS  . traZODone  200 mg Oral QHS  . triamterene-hydrochlorothiazide  0.5 tablet Oral Daily  . vitamin B-12  1,000 mcg Oral Daily   Continuous Infusions: PRN Meds:.acetaminophen **OR** acetaminophen,  HYDROcodone-acetaminophen, hydrOXYzine, ondansetron **OR** ondansetron (ZOFRAN) IV   Data Review:   Micro Results Recent Results (from the past 240 hour(s))  CULTURE, URINE COMPREHENSIVE     Status: None (Preliminary result)   Collection Time: 10/30/18  1:42 PM   Specimen: Urine   UR  Result Value Ref Range Status   Urine Culture, Comprehensive Preliminary report  Preliminary   Organism ID, Bacteria Comment  Preliminary    Comment: Microbiological testing to rule out the presence of possible pathogens is in progress. 50,000-100,000 colony forming units per mL    Organism ID, Bacteria Comment  Preliminary    Comment: Mixed urogenital flora 10,000-25,000 colony forming units per mL   Microscopic Examination     Status: Abnormal   Collection Time: 10/30/18  1:42 PM   URINE  Result Value Ref Range Status   WBC, UA 6-10 (A) 0 - 5 /hpf Final   RBC None seen 0 - 2 /hpf Final   Epithelial Cells (non renal) 0-10 0 - 10 /hpf Final   Renal Epithel, UA 0-10 (A) None seen /hpf Final   Casts Present (A) None seen /lpf Final   Cast Type Hyaline casts N/A Final   Bacteria, UA Few None seen/Few Final  SARS Coronavirus 2 [facility](Hospital order, Performed in [facility] lab) Nasopharyngeal Nasopharyngeal Swab     Status: None   Collection Time: 11/01/18 12:03 AM   Specimen: Nasopharyngeal Swab  Result Value Ref Range Status   SARS Coronavirus 2 NEGATIVE NEGATIVE Final    Comment: (NOTE) If result is NEGATIVE SARS-CoV-2 target nucleic acids are NOT DETECTED. The SARS-CoV-2 RNA is generally detectable in upper and lower  respiratory specimens during the acute phase of infection. The lowest  concentration of SARS-CoV-2 viral copies this assay can detect is 250  copies / mL. A negative result does not preclude SARS-CoV-2 infection  and should not be used as the sole basis for treatment or other  patient management decisions.  A negative result may occur with  improper specimen collection /  handling, submission of specimen other  than nasopharyngeal swab, presence of viral mutation(s) within the  areas targeted by this assay, and inadequate number of viral copies  (<250 copies / mL). A negative result must be combined with clinical  observations, patient history, and epidemiological information. If result is POSITIVE SARS-CoV-2 target nucleic acids are DETECTED. The SARS-CoV-2 RNA is generally detectable in upper and lower  respiratory specimens dur ing the acute phase of infection.  Positive  results are indicative of active infection with SARS-CoV-2.  Clinical  correlation with patient history and other diagnostic information is  necessary to determine patient infection status.  Positive results do  not rule out bacterial infection or co-infection with other viruses. If result is PRESUMPTIVE POSTIVE SARS-CoV-2 nucleic acids MAY BE PRESENT.   A presumptive positive result was obtained on the submitted specimen  and confirmed on repeat testing.  While 2019 novel coronavirus  (SARS-CoV-2) nucleic acids may be present in the submitted sample  additional confirmatory testing may be necessary for epidemiological  and / or clinical management purposes  to differentiate between  SARS-CoV-2 and other Sarbecovirus currently known to infect humans.  If clinically indicated additional testing with an alternate test  methodology [id number]) is advised. The SARS-CoV-2 RNA is generally  detectable in upper and lower respiratory sp ecimens during the acute  phase of infection. The expected result is Negative. Fact Sheet for Patients:  BoilerBrush.com.cy Fact Sheet for Healthcare Providers: https://pope.com/ This test is not yet approved or cleared by the [place] FDA and has been authorized for detection and/or diagnosis of SARS-CoV-2 by FDA under an Emergency Use Authorization (EUA).  This EUA will remain in effect (meaning this  test can be used) for the duration of the COVID-19 declaration under Section 564(b)(1) of the Act, 21 U.S.C. section 360bbb-3(b)(1), unless the authorization is terminated or revoked sooner. Performed at [facility], [address], [place], [place] [address]     Radiology Reports Ct Head Wo Contrast  Result Date: 10/31/2018 CLINICAL DATA:  Fall, injury to the back of the head and back the EXAM: CT HEAD WITHOUT CONTRAST; CT CERVICAL SPINE WITHOUT CONTRAST TECHNIQUE: Contiguous axial images were obtained from the base of the skull through the vertex without intravenous contrast. COMPARISON:  None. FINDINGS: Brain: No evidence of acute territorial infarction, hemorrhage, hydrocephalus,extra-axial collection or mass lesion/mass effect. There is dilatation the ventricles and sulci consistent with age-related atrophy. Low-attenuation changes in the deep white matter consistent with small vessel ischemia. Vascular: No hyperdense vessel or unexpected calcification. Skull: The skull is intact. No fracture or focal lesion identified. Sinuses/Orbits: The visualized paranasal sinuses and mastoid air cells are clear. The orbits and globes intact. Other: None Cervical spine: Alignment: Physiologic Skull base and vertebrae: Visualized skull base is intact. No atlanto-occipital dissociation. The patient is status post ACDF at C5-C6. Solid interbody fusion is seen at C5-C6. No periprosthetic lucency or fracture is identified. Soft tissues and spinal canal: The visualized paraspinal soft tissues are unremarkable. No prevertebral soft tissue swelling is seen. The spinal canal is grossly unremarkable, no large epidural collection or significant canal narrowing. Disc levels: Multilevel disc osteophyte complex with anterior osteophytes and uncovertebral osteophytes are noted. This is most notable at C3-C4 and C4-C5 with neural foraminal stenosis. Upper chest: The lung apices are clear. Thoracic inlet is within  normal limits. Other: None IMPRESSION: No acute intracranial abnormality. Findings consistent with age related atrophy and chronic small vessel ischemia No  acute fracture or malalignment of the spine. Status post ACDF at C5-C6 without hardware complication. Electronically Signed   By: Jonna Clark M.D.   On: 10/31/2018 20:54   Ct Cervical Spine Wo Contrast  Result Date: 10/31/2018 CLINICAL DATA:  Fall, injury to the back of the head and back the EXAM: CT HEAD WITHOUT CONTRAST; CT CERVICAL SPINE WITHOUT CONTRAST TECHNIQUE: Contiguous axial images were obtained from the base of the skull through the vertex without intravenous contrast. COMPARISON:  None. FINDINGS: Brain: No evidence of acute territorial infarction, hemorrhage, hydrocephalus,extra-axial collection or mass lesion/mass effect. There is dilatation the ventricles and sulci consistent with age-related atrophy. Low-attenuation changes in the deep white matter consistent with small vessel ischemia. Vascular: No hyperdense vessel or unexpected calcification. Skull: The skull is intact. No fracture or focal lesion identified. Sinuses/Orbits: The visualized paranasal sinuses and mastoid air cells are clear. The orbits and globes intact. Other: None Cervical spine: Alignment: Physiologic Skull base and vertebrae: Visualized skull base is intact. No atlanto-occipital dissociation. The patient is status post ACDF at C5-C6. Solid interbody fusion is seen at C5-C6. No periprosthetic lucency or fracture is identified. Soft tissues and spinal canal: The visualized paraspinal soft tissues are unremarkable. No prevertebral soft tissue swelling is seen. The spinal canal is grossly unremarkable, no large epidural collection or significant canal narrowing. Disc levels: Multilevel disc osteophyte complex with anterior osteophytes and uncovertebral osteophytes are noted. This is most notable at C3-C4 and C4-C5 with neural foraminal stenosis. Upper chest: The lung apices  are clear. Thoracic inlet is within normal limits. Other: None IMPRESSION: No acute intracranial abnormality. Findings consistent with age related atrophy and chronic small vessel ischemia No acute fracture or malalignment of the spine. Status post ACDF at C5-C6 without hardware complication. Electronically Signed   By: Jonna Clark M.D.   On: 10/31/2018 20:54   Ct Lumbar Spine Wo Contrast  Result Date: 10/31/2018 CLINICAL DATA:  Back pain, trauma EXAM: CT LUMBAR SPINE WITHOUT CONTRAST TECHNIQUE: Multidetector CT imaging of the lumbar spine was performed without intravenous contrast administration. Multiplanar CT image reconstructions were also generated. COMPARISON:  None. FINDINGS: Segmentation: There are 5 non-rib bearing lumbar type vertebral bodies with the last intervertebral disc space labeled as L5-S1. Alignment: There is a mild levoconvex scoliotic curvature of the lumbar spine. Vertebrae: The vertebral body heights are well maintained. No fracture, malalignment, or pathologic osseous lesions seen. Paraspinal and other soft tissues: The paraspinal soft tissues and visualized retroperitoneal structures are unremarkable. The sacroiliac joints are intact. Scattered aortic atherosclerosis is seen. 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 IMPRESSION: No acute fracture or malalignment of the spine. Electronically Signed   By: Jonna Clark M.D.   On: 10/31/2018 20:57   Dg Chest Portable 1 View  Result Date: 10/31/2018 CLINICAL DATA:  Fall.  Dizziness. EXAM: PORTABLE CHEST 1 VIEW COMPARISON:  04/06/2018 FINDINGS: The heart size and mediastinal contours are within normal limits. Both lungs are clear. The visualized skeletal structures are unremarkable. Unchanged position of left chest wall cardiac device. IMPRESSION: No active disease.  Electronically Signed   By: Deatra Robinson M.D.   On: 10/31/2018 21:11   Dg Shoulder Left  Result Date: 10/31/2018 CLINICAL DATA:  78 year old who fell five days ago and complains of persistent LEFT shoulder pain and RIGHT hip pain. Initial encounter. EXAM: LEFT SHOULDER -  2+ VIEW COMPARISON:  None. FINDINGS: No evidence of acute, subacute or healed fracture. Glenohumeral joint anatomically aligned with well-preserved joint space. Subacromial space well-preserved. Acromioclavicular joint anatomically aligned without significant degenerative changes. Well preserved bone mineral density. No intrinsic osseous abnormality. IMPRESSION: Normal examination. Electronically Signed   By: Evangeline Dakin M.D.   On: 10/31/2018 21:06   Dg Hip Unilat W Or Wo Pelvis 2-3 Views Right  Result Date: 10/31/2018 CLINICAL DATA:  Hip pain after fall EXAM: DG HIP (WITH OR WITHOUT PELVIS) 2-3V RIGHT COMPARISON:  None. FINDINGS: There is no evidence of hip fracture or dislocation. There is no evidence of arthropathy or other focal bone abnormality. IMPRESSION: Negative. Electronically Signed   By: Ulyses Jarred M.D.   On: 10/31/2018 21:10     CBC Recent Labs  Lab 10/31/18 1950 11/01/18 0527  WBC 8.0 7.2  HGB 13.4 11.9*  HCT 40.9 36.4  PLT 180 159  MCV 94.0 92.4  MCH 30.8 30.2  MCHC 32.8 32.7  RDW 15.6* 15.3  LYMPHSABS 1.6  --   MONOABS 0.9  --   EOSABS 0.1  --   BASOSABS 0.0  --     Chemistries  Recent Labs  Lab 10/31/18 1950 11/01/18 0527  NA 141 141  K 3.4* 3.0*  CL 99 102  CO2 29 29  GLUCOSE 94 85  BUN 17 17  CREATININE 1.21* 1.04*  CALCIUM 9.5 8.7*   ------------------------------------------------------------------------------------------------------------------ estimated creatinine clearance is 42.9 mL/min (A) (by C-G formula based on SCr of 1.04 mg/dL (H)). ------------------------------------------------------------------------------------------------------------------ No results for  input(s): HGBA1C in the last 72 hours. ------------------------------------------------------------------------------------------------------------------ No results for input(s): CHOL, HDL, LDLCALC, TRIG, CHOLHDL, LDLDIRECT in the last 72 hours. ------------------------------------------------------------------------------------------------------------------ No results for input(s): TSH, T4TOTAL, T3FREE, THYROIDAB in the last 72 hours.  Invalid input(s): FREET3 ------------------------------------------------------------------------------------------------------------------ Recent Labs    11/01/18 1123  VITAMINB12 1,068*    Coagulation profile No results for input(s): INR, PROTIME in the last 168 hours.  No results for input(s): DDIMER in the last 72 hours.  Cardiac Enzymes No results for input(s): CKMB, TROPONINI, MYOGLOBIN in the last 168 hours.  Invalid input(s): CK ------------------------------------------------------------------------------------------------------------------ Invalid input(s): [place]  Patient 78 year old admitted with ataxia/gait instability   1. Ataxia - Due to pacemaker patient unable to have MRI of the brain here but is able to have MRI TO:IZTI I will check a B12 level RPR PT evaluation Continue aspirin/Plavix I will asked neurology to see   2. Hypertension -continue metoprolol   3.   GERD (gastroesophageal reflux disease) -home dose PPI   4.   Sick sinus syndrome [facility]) -patient has pacemaker, though it is MRI compatible per pacemaker insertion note by Dr. Saralyn Pilar in November 2018  5.  Hypokalemia replace K+     Code Status Orders  (From admission, onward)         Start     Ordered   11/01/18 1049  Limited resuscitation (code)  Continuous    Question Answer Comment  In the event of cardiac or respiratory ARREST: Initiate Code Blue, Call Rapid Response Yes   In the event of cardiac or respiratory ARREST:  Perform CPR Yes   In the event of cardiac or respiratory ARREST: Perform Intubation/Mechanical Ventilation No   In the event of cardiac or respiratory ARREST: Use NIPPV/BiPAp only if indicated Yes   In the event of cardiac or respiratory ARREST: Administer ACLS medications if indicated Yes   In the event of  cardiac or respiratory ARREST: Perform Defibrillation or Cardioversion if indicated Yes      11/01/18 1048        Code Status History    Date Active Date Inactive Code Status Order ID Comments User Context   11/01/2018 0242 11/01/2018 1048 Full Code [id number]  Oralia Manis, MD Inpatient   10/26/2017 0825 10/26/2017 1220 Full Code [id number]  Marcina Millard, MD Inpatient   12/14/2016 1543 12/15/2016 1728 Full Code [id number]  Marcina Millard, MD Inpatient   11/14/2016 2145 11/17/2016 2020 Full Code [id number]  Altamese Dilling, MD ED   09/10/2016 1617 09/11/2016 1850 Full Code [id number]  Auburn Bilberry, MD Inpatient   09/26/2015 2050 10/01/2015 1857 Full Code [id number]  Clapacs, Jackquline Denmark, MD Inpatient   Advance Care Planning Activity    Advance Directive Documentation     Most Recent Value  Type of Advance Directive  Healthcare Power of Attorney  Pre-existing out of facility DNR order (yellow form or pink MOST form)  -  "MOST" Form in Place?  -           Consults none   DVT Prophylaxis  Lovenox   Lab Results  Component Value Date   PLT 159 11/01/2018     Time Spent in minutes   Greater than 50% of time spent in care coordination and counseling patient regarding the condition and plan of care.   Auburn Bilberry M.D on 11/01/2018 at 2:28 PM  Between 7am to 6pm - Pager - [phone]  After 6pm go to [email or web address] - Social research officer, government  [employer]   Office  [phone]

## 2018-11-02 LAB — BASIC METABOLIC PANEL
Anion gap: 6 (ref 5–15)
BUN: 19 mg/dL (ref 8–23)
CO2: 30 mmol/L (ref 22–32)
Calcium: 8.9 mg/dL (ref 8.9–10.3)
Chloride: 106 mmol/L (ref 98–111)
Creatinine, Ser: 1.05 mg/dL — ABNORMAL HIGH (ref 0.44–1.00)
GFR calc Af Amer: 60 mL/min — ABNORMAL LOW (ref >60)
GFR calc non Af Amer: 52 mL/min — ABNORMAL LOW (ref >60)
Glucose, Bld: 93 mg/dL (ref 70–99)
Potassium: 3.3 mmol/L — ABNORMAL LOW (ref 3.5–5.1)
Sodium: 142 mmol/L (ref 135–145)

## 2018-11-02 LAB — RPR: RPR Ser Ql: NONREACTIVE

## 2018-11-02 NOTE — Progress Notes (Signed)
Rehab Admissions Coordinator Note:  Per PT recommendation, this patient was screened by Raechel Ache for appropriateness for an Inpatient Acute Rehab Consult.  At this time, it is noted that the workup is still underway and neurology has been consulted. Will await neurology consult to determine if pt has a diagnosis amendable to CIR.   Raechel Ache 11/02/2018, 9:30 AM  I can be reached at [phone].

## 2018-11-02 NOTE — Consult Note (Signed)
Reason for Consult:Ataxia Referring Physician: Hospitalist  CC: ataxia  HPI: Maria Horton is an 78 y.o. female admitted with freq falls and ataxia  Patient is very poor and very limited historian concerning for underlying dementia. HPI per EMR mostly  From what I could get from patient, she has been having freq falls for one month. She uses a walker.associated with urinary incontinence. She had a pacemaker placed and ACDF she carries diagnostic of PD but she is not sure, states she has memory problem with trouble remenbering things and being forgetful. Denies HA, visual disturbances, weakness, she admits being forgetful and not remenbering things,  tingling on both hands, urinary urgency and incontinence, gait instabilty. She also  states that she trouble writing  Reviewing patient notes, she was seen, evaluated ( including MRI in 07/13/2018) and f/up by neurologist on 08/29/2018 for the same concern leading to her admission at our facility [facility]  Notes from her neurologist on 08/29/2018 "History and Present Illness:   Ms. Bellot is a right handed 78 y.o. 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 Postural and action) + incoordination + imbalance + bradykinesia bilaterally left > right:   Per patient's daughter patient does have some issues with cognition. Patient's daughter feels patient's memory is patchy. Patient was carries a diagnosis of Vascular dementia, diagnosed around 2005 per daughter. Patient was diagnosed in [place]. Patient moved from [place] around 2015 to be closer to daughter. Patient was previously living with daughter but patient moved out on her own. She has no difficulty with ADLs. No repeating questions or  conversations. Patient is no longer driving. Her daughter manages her medications and finances. Daughter or neighbor will help patient with meals.   Patient referred to our office by Dr. Lanney Gins for parkinsonism symptoms. Reported frequent falls and ataxia. Patient reports right and leg hand tremor. Tremor worsens when she completes certain tasks like writing or holding things like a spoon. Has had this for 6 months or so. Patient states that sleep or rest can help with tremors or to "stop using her hands". No caffeinated drinks during the day. No family members with tremors. No head injury. Unsure if alcohol helps with tremors. Patient also has been having difficulty walking and falls. Daughter with her states that she has had this three months. She has completed physical therapy. Using a walker with a seat. States she can feel dizzy at times. Has difficulty getting in and out of cars and difficulty with stairs. No freezing or shuffling gait noted by patient or daughter. Has difficulty with swallowing food and meds. Daughter and patient feels her voice is hoarse. No driving. Daughter handles her meds. Daughter handles meals and finances or her neighbor helps with meals. Has seen a psychiatrist per daughter in [place] per daughter who gave this meds but she has had no diagnosis of Parkinson's disease.   At initial evaluation Ms. Nudelman denied unilateral resting tremors, decreased volume of speech, drooling, feeling of stiffness, slowness of the movements, imbalance, constipation, vivid dreams at night or acting out of the dreams etc at initial visit.  Medication: Carbidopa, Sinemet CR 50-200  History of depression and anxiety: following psychiatry with [place] neuropsych. Taking Wellbutrin and Hydroxyzine   Restless Leg Syndrome: Taking  Requip   History of stroke: Patient has had a stroke in the past year and has a pacemaker. Taking Aspirin, Plavix and Pravastatin.   MRI brain without contrast  07/14/2018: No acute intracranial finding. Moderate chronic small-vessel ischemic changes. Scattered old cortical infarctions, largest at the left deep insula region. Fluid level in the sphenoid sinus which could indicate sphenoid sinusitis and be associated with headache.  Past Medical History:  Past Medical History:  Diagnosis Date  . Anxiety  . Atherosclerosis  . Bronchiectasis without complication (CMS-HCC)  R middle lobe  . Colon polyps  . Depression  . Diverticulitis  s/p surgery  . Fatty liver  . Fibromyalgia  . Fibrosis lung (CMS-HCC)  R middle lobe  . Gastroparesis  . GERD (gastroesophageal reflux disease)  . Hypertension  . Osteoarthritis  . Stroke (CMS-HCC)  . TIA (transient ischemic attack)  . Vascular dementia (CMS-HCC)   Past Surgical History:  Past Surgical History:  Procedure Laterality Date  . ABDOMINOPLASTY  . ARTHROSCOPIC ROTATOR CUFF REPAIR Right  . bunionectomy  . CHOLECYSTECTOMY  . COLECTOMY PARTIAL W/ANASTAMOSIS  . HYSTERECTOMY VAGINAL  . INSERT / REPLACE / REMOVE PACEMAKER  . tonsilecomy   Family History:  Family History  Problem Relation Age of Onset  . Diabetes type II Mother  . High blood pressure (Hypertension) Mother  . Heart disease Mother  . Emphysema Mother  . Diabetes type II Father  . Diabetes type II Sister  . Breast cancer Sister  . Cerebral aneurysm Child  . High blood pressure (Hypertension) Child   Social History:  Social History   Socioeconomic History  . Marital status: Widowed  Spouse name: Not on file  . Number of children: Not on file  . Years of education: Not on file  . Highest education level: Not on file  Occupational History  . Occupation: retired  Scientific laboratory technician  . Financial resource strain: Not on file  . Food insecurity:  Worry: Not on file  Inability: Not on file  . Transportation needs:  Medical: Not on file  Non-medical: Not on file  Tobacco Use  . Smoking status: Never Smoker  . Smokeless  tobacco: Never Used  Substance and Sexual Activity  . Alcohol use: No  . Drug use: Defer  . Sexual activity: Defer  Lifestyle  . Physical activity:  Days per week: Not on file  Minutes per session: Not on file  . Stress: Not on file  Relationships  . Social connections:  Talks on phone: Not on file  Gets together: Not on file  Attends religious service: Not on file  Active member of club or organization: Not on file  Attends meetings of clubs or organizations: Not on file  Relationship status: Not on file  Other Topics Concern  . Not on file  Social History Narrative  . Not on file   Allergies:  Allergies  Allergen Reactions  . Statins-Hmg-Coa Reductase Inhibitors Muscle Pain  . Ace Inhibitors Unknown  . Amlodipine Other (See Comments)  Feels achy like sick  . Adhesive Tape-Silicones Rash   Medications: Current Outpatient Medications on File Prior to Visit  Medication Sig Dispense Refill  . aspirin 81 MG EC tablet Take 81 mg by mouth daily.  Marland Kitchen b complex vitamins tablet Take by mouth  . buPROPion (WELLBUTRIN XL) 300 MG XL tablet Take 300 mg by mouth once daily  . carbidopa-levodopa (SINEMET CR) 50-200 mg CR tablet Take 1 tablet by mouth once daily  . cholecalciferol (  VITAMIN D3) 1,000 unit capsule Take 1,000 Units by mouth once daily  . citalopram (CELEXA) 40 MG tablet Take 1 tablet (40 mg total) by mouth once daily 90 tablet 3  . clonazePAM (KLONOPIN) 0.5 MG tablet 0.5 mg nightly  . clopidogreL (PLAVIX) 75 mg tablet TAKE 1 TABLET BY MOUTH EVERY DAY 90 tablet 1  . esomeprazole (NEXIUM) 40 MG DR capsule Take 1 capsule (40 mg total) by mouth 2 (two) times daily 60 capsule 11  . HYDROcodone-acetaminophen (NORCO) 7.5-325 mg tablet Take 1 tablet by mouth every 6 (six) hours as needed for Pain 20 tablet 0  . hydrOXYzine (ATARAX) 50 MG tablet Take 50 mg by mouth TAKE 1-2 TABLETS at bedtime as needed.  . melatonin (MELATIN ORAL) Take 10 mg by mouth nightly 2 Tablets nightly  .  metoprolol succinate (TOPROL-XL) 25 MG XL tablet Take 25 mg by mouth once daily Half tab daily  . pravastatin (PRAVACHOL) 40 MG tablet TAKE 1 TABLET BY MOUTH EVERY DAY IN THE EVENING 90 tablet 1  . rOPINIRole (REQUIP) 0.5 MG tablet TAKE 1 TABLET (0.5 MG TOTAL) BY MOUTH 3 (THREE) TIMES DAILY 270 tablet 0  . traZODone (DESYREL) 100 MG tablet Take 1 tablet (100 mg total) by mouth nightly 30 tablet 2  . triamterene-hydrochlorothiazide (MAXZIDE-25) 37.5-25 mg tablet Take 0.5 tablets by mouth once daily 90 tablet 3  . albuterol (ACCUNEB) 1.25 mg/3 mL nebulizer solution Take 3 mLs (1.25 mg total) by nebulization every 6 (six) hours as needed for Wheezing (Patient not taking: Reported on 08/29/2018 ) 75 mL 12  . mucus clearing device Devi Use 1 each 5 (five) times daily Acapella device (Patient not taking: Reported on 08/29/2018 ) 1 Device 0   No current facility-administered medications on file prior to visit.   Review of Systems: _0 @  General Exam:   Vitals:  08/29/18 1059  BP: 110/70  Pulse: 64  Temp: 37 C (98.6 F)  Weight: 66.7 kg (147 lb)  Height: 162.6 cm (_1 )   Body mass index is 25.23 kg/m.  Patient looks appropriate of age, well built, nourished and appropriately groomed.  Cardiovascular Exam: S1, S2 heart sounds present Carotid exam revealed no bruit Lung exam was clear to auscultation Fundoscopic exam through undilated pupils did not show papilledema.  Neurological Exam:   Date recall (08/29/2018)  Incorrect day of the week (patient said Thursday)  Correct home address, incorrect zip code  Correct phone number   Mental Status: Alert, Oriented to time, place, person and situation Attention span and concentration seemed appropriate Memory seemed OK. Intact naming, repetition, comprehension.  Followed 2 step commands - no dysarthria Fund of knowledge seemed appropriate for age and health status.  Cranial Nerves: Olfactory and vagus nerves not are examined Visual  fields were full Pupils were equal, round and reactive to light and accommodation Extra-ocular movements are normal Facial sensations are normal Face is symmetric Decreased hearing Palate and uvular movements are normal and oral sensations are OK Neck muscle strength and shoulder shrug is normal Tongue protrusion and uvular elevation are normal  Motor Exam: Tone is normal in all extremities Muscle strength in all extremities is 5/5. No abnormal movements, fasciculations or atrophy seen  Deep Tendon Reflexes: Right Biceps is 2+, Left Biceps is 2+ Right Triceps is 2+, Left Triceps is 2+ Right Brachioradialis is 2+, Left Brachioradialis is 2+ Right Knee Jerk is 2+, Left Knee Jerk is 2+ Right Ankle Jerk is 2+, Left Ankle Jerk is 2+  Right Toes are down going, Left Toes are down going  Sensory Exam: Sensations were intact to light touch in all extremities Vibration and proprioception are also intact  Co-ordination: Bilateral bradykinesia left > right  Could not consistently maintain amplitude and rhythm with finger taps left > right  Gait: Had a hard time getting up  festination gait goes from the left to the right  Take small steps when walking   Assessment and Plan:   In most patients we give written parts of assessment and plan to patient under "patient instructions". So some parts are directed to patient.  1. 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 Postural and action) + incoordination + imbalance + bradykinesia bilaterally left > right   - Discussed motor symptoms and non motor symptoms of parkinsonism with patient and family   - We reviewed MRI brain with Ms. Steinhauser done on 07/13/2018, which showed atrophy and moderate to severe white matter microvascular ischemic  changes.  - Expressed concern over patient's driving, patient should not continue to drive  - Discussed concern over patient living by herself, patient is not capable living by herself.  - Advised family to take over managing patient's medications   - We will send referral to home health for physical therapy for balance and speech therapy for cognitive training  - Taking Sinemet CR 50-200 mg daily - advised patient to start taking medication at night   - Start Carbidopa-Levodopa 25-100 mg half pill three times a day for three days then increase to 1 pill three times a day  Carbidopa/levodopa in different formulations are commonly used for parkinsonism. Its side effects include nausea, vomiting, dyskinesia, visual hallucinations, worsening of orthostatic hypotension, rare impulse control disorder.  2. Restless Leg Syndrome - taking Requip 0.5 mg three times a day   3. History of stroke  - Taking Aspirin 81 mg daily  - Taking Plavix 75 mg daily  - Taking Pravastatin 40 mg daily   4. History of depression and anxiety - following psychiatry with [place] neuropsych  - Taking Wellbutrin XL 300 mg daily  - Taking hydroxyzine 50 mg two times a day as needed   Return in about 4 months (around 12/29/2018) for parkinsonism follow up, with Gayland Curry, PA."    Past Medical History:  Diagnosis Date  . Anxiety   . Chronic back pain   . Constipation   . Depression   . Diverticulosis   . Fibromyalgia   . Gastroparesis   . GERD (gastroesophageal reflux disease)   . Hypertension   . Hypoglycemia   . Hypokalemia   . Osteoarthritis   . PONV (postoperative nausea and vomiting)   . Reflux   . Stroke ([place])   . TIA (transient ischemic attack)   . Vascular dementia ([place])   . Vitamin D deficiency     Past Surgical History:  Procedure Laterality Date  . ABDOMINAL HYSTERECTOMY    . ABDOMINOPLASTY    . BREAST BIOPSY Left 2005   bengin, with clip  . BUNIONECTOMY Bilateral   . CHOLECYSTECTOMY     . COLON SURGERY    . FLEXIBLE BRONCHOSCOPY Bilateral 01/11/2018   Procedure: FLEXIBLE BRONCHOSCOPY;  Surgeon: Ottie Glazier, MD;  Location: [facility] OR[facility];  Service: Thoracic;  Laterality: Bilateral;  . LEFT HEART CATH AND CORONARY ANGIOGRAPHY Left  10/26/2017   Procedure: LEFT HEART CATH AND CORONARY ANGIOGRAPHY;  Surgeon: Isaias Cowman, MD;  Location: [facility];  Service: Cardiovascular;  Laterality: Left;  . NECK SURGERY    . PACEMAKER INSERTION N/A 12/14/2016   Procedure: INSERTION PACEMAKER;  Surgeon: Isaias Cowman, MD;  Location: [facility] OR[facility];  Service: Cardiovascular;  Laterality: N/A;  . ROTATOR CUFF REPAIR    . TONSILLECTOMY      Family History  Problem Relation Age of Onset  . Hypertension Mother   . Breast cancer Sister 32    Social History:  reports that she has never smoked. She has never used smokeless tobacco. She reports that she does not drink alcohol or use drugs.  Allergies  Allergen Reactions  . 5-Alpha Reductase Inhibitors   . Ace Inhibitors Other (See Comments)    achy  . Beta Adrenergic Blockers Other (See Comments)    Makes achy/feel sick  . Calcium Channel Blockers Other (See Comments)    Feels achy like sick  . Latex Rash  . Tape Rash    Medications: I have reviewed the patient's current medications.  ROS: *as per hpi Physical Examination: Blood pressure (!) 110/52, pulse 60, temperature 98.7 F (37.1 C), temperature source Oral, resp. rate 18, height _0  (1.626 m), weight 65.7 kg, SpO2 94 %.  Neurologic Examination Alert, awake, oriented to person and place, speech is nle PERLA, EOMI, no nystagmus, VFF, face symmetrical, face sensation seems nle, uvula and tongue midline No motor deficit appreciated. Tone is nle No sensory deficit appreciated DTR 1-2  She has slight dysmetria in FN and heel to knee Gait not checked at time of examination for safety consideration  Results for orders placed or performed during the hospital  encounter of 10/31/18 (from the past 48 hour(s))  CBC with Differential     Status: Abnormal   Collection Time: 10/31/18  7:50 PM  Result Value Ref Range   WBC 8.0 4.0 - 10.5 K/uL   RBC 4.35 3.87 - 5.11 MIL/uL   Hemoglobin 13.4 12.0 - 15.0 g/dL   HCT 40.9 36.0 - 46.0 %   MCV 94.0 80.0 - 100.0 fL   MCH 30.8 26.0 - 34.0 pg   MCHC 32.8 30.0 - 36.0 g/dL   RDW 15.6 (H) 11.5 - 15.5 %   Platelets 180 150 - 400 K/uL   nRBC 0.0 0.0 - 0.2 %   Neutrophils Relative % 69 %   Neutro Abs 5.5 1.7 - 7.7 K/uL   Lymphocytes Relative 19 %   Lymphs Abs 1.6 0.7 - 4.0 K/uL   Monocytes Relative 11 %   Monocytes Absolute 0.9 0.1 - 1.0 K/uL   Eosinophils Relative 1 %   Eosinophils Absolute 0.1 0.0 - 0.5 K/uL   Basophils Relative 0 %   Basophils Absolute 0.0 0.0 - 0.1 K/uL   Immature Granulocytes 0 %   Abs Immature Granulocytes 0.03 0.00 - 0.07 K/uL    Comment: Performed at [facility], [address] [place], [place] [address]  Basic metabolic panel     Status: Abnormal   Collection Time: 10/31/18  7:50 PM  Result Value Ref Range   Sodium 141 135 - 145 mmol/L   Potassium 3.4 (L) 3.5 - 5.1 mmol/L   Chloride 99 98 - 111 mmol/L   CO2 29 22 - 32 mmol/L   Glucose, Bld 94 70 - 99 mg/dL   BUN 17 8 - 23 mg/dL   Creatinine, Ser 1.21 (H) 0.44 -  1.00 mg/dL   Calcium 9.5 8.9 - 10.3 mg/dL   GFR calc non Af Amer 43 (L) >60 mL/min   GFR calc Af Amer 50 (L) >60 mL/min   Anion gap 13 5 - 15    Comment: Performed at [facility], [address] [place], [place] [address]  Urinalysis, Complete w Microscopic     Status: Abnormal   Collection Time: 10/31/18  7:50 PM  Result Value Ref Range   Color, Urine AMBER (A) YELLOW    Comment: BIOCHEMICALS MAY BE AFFECTED BY COLOR   APPearance CLOUDY (A) CLEAR   Specific Gravity, Urine 1.026 1.005 - 1.030   pH 5.0 5.0 - 8.0   Glucose, UA NEGATIVE NEGATIVE mg/dL   Hgb urine dipstick NEGATIVE NEGATIVE   Bilirubin Urine NEGATIVE NEGATIVE   Ketones, ur  5 (A) NEGATIVE mg/dL   Protein, ur NEGATIVE NEGATIVE mg/dL   Nitrite NEGATIVE NEGATIVE   Leukocytes,Ua SMALL (A) NEGATIVE   RBC / HPF 0-5 0 - 5 RBC/hpf   WBC, UA 11-20 0 - 5 WBC/hpf   Bacteria, UA NONE SEEN NONE SEEN   Squamous Epithelial / LPF 0-5 0 - 5   Mucus PRESENT     Comment: Performed at [facility], [address] [place], [place] [address]  SARS Coronavirus 2 [address] [facility] order, Performed in [facility] hospital lab) Nasopharyngeal Nasopharyngeal Swab     Status: None   Collection Time: 11/01/18 12:03 AM   Specimen: Nasopharyngeal Swab  Result Value Ref Range   SARS Coronavirus 2 NEGATIVE NEGATIVE    Comment: (NOTE) If result is NEGATIVE SARS-CoV-2 target nucleic acids are NOT DETECTED. The SARS-CoV-2 RNA is generally detectable in upper and lower  respiratory specimens during the acute phase of infection. The lowest  concentration of SARS-CoV-2 viral copies this assay can detect is 250  copies / mL. A negative result does not preclude SARS-CoV-2 infection  and should not be used as the sole basis for treatment or other  patient management decisions.  A negative result may occur with  improper specimen collection / handling, submission of specimen other  than nasopharyngeal swab, presence of viral mutation(s) within the  areas targeted by this assay, and inadequate number of viral copies  (<250 copies / mL). A negative result must be combined with clinical  observations, patient history, and epidemiological information. If result is POSITIVE SARS-CoV-2 target nucleic acids are DETECTED. The SARS-CoV-2 RNA is generally detectable in upper and lower  respiratory specimens dur ing the acute phase of infection.  Positive  results are indicative of active infection with SARS-CoV-2.  Clinical  correlation with patient history and other diagnostic information is  necessary to determine patient infection status.  Positive results do  not rule out bacterial infection or  co-infection with other viruses. If result is PRESUMPTIVE POSTIVE SARS-CoV-2 nucleic acids MAY BE PRESENT.   A presumptive positive result was obtained on the submitted specimen  and confirmed on repeat testing.  While 2019 novel coronavirus  (SARS-CoV-2) nucleic acids may be present in the submitted sample  additional confirmatory testing may be necessary for epidemiological  and / or clinical management purposes  to differentiate between  SARS-CoV-2 and other Sarbecovirus currently known to infect humans.  If clinically indicated additional testing with an alternate test  methodology [id number]) is advised. The SARS-CoV-2 RNA is generally  detectable in upper and lower respiratory sp ecimens during the acute  phase of infection. The expected result is Negative. Fact Sheet  for Patients:  StrictlyIdeas.no Fact Sheet for Healthcare Providers: BankingDealers.co.za This test is not yet approved or cleared by the [place] FDA and has been authorized for detection and/or diagnosis of SARS-CoV-2 by FDA under an Emergency Use Authorization (EUA).  This EUA will remain in effect (meaning this test can be used) for the duration of the COVID-19 declaration under Section 564(b)(1) of the Act, 21 U.S.C. section 360bbb-3(b)(1), unless the authorization is terminated or revoked sooner. Performed at [facility], [address] [place], [place] [address]   Basic metabolic panel     Status: Abnormal   Collection Time: 11/01/18  5:27 AM  Result Value Ref Range   Sodium 141 135 - 145 mmol/L   Potassium 3.0 (L) 3.5 - 5.1 mmol/L   Chloride 102 98 - 111 mmol/L   CO2 29 22 - 32 mmol/L   Glucose, Bld 85 70 - 99 mg/dL   BUN 17 8 - 23 mg/dL   Creatinine, Ser 1.04 (H) 0.44 - 1.00 mg/dL   Calcium 8.7 (L) 8.9 - 10.3 mg/dL   GFR calc non Af Amer 52 (L) >60 mL/min   GFR calc Af Amer >60 >60 mL/min   Anion gap 10 5 - 15    Comment: Performed at  [facility], [address] [place], [place] [address]  CBC     Status: Abnormal   Collection Time: 11/01/18  5:27 AM  Result Value Ref Range   WBC 7.2 4.0 - 10.5 K/uL   RBC 3.94 3.87 - 5.11 MIL/uL   Hemoglobin 11.9 (L) 12.0 - 15.0 g/dL   HCT 36.4 36.0 - 46.0 %   MCV 92.4 80.0 - 100.0 fL   MCH 30.2 26.0 - 34.0 pg   MCHC 32.7 30.0 - 36.0 g/dL   RDW 15.3 11.5 - 15.5 %   Platelets 159 150 - 400 K/uL   nRBC 0.0 0.0 - 0.2 %    Comment: Performed at [facility], [address] [place], [place] [address]  Vitamin B12     Status: Abnormal   Collection Time: 11/01/18 11:23 AM  Result Value Ref Range   Vitamin B-12 1,068 (H) 180 - 914 pg/mL    Comment: (NOTE) This assay is not validated for testing neonatal or myeloproliferative syndrome specimens for Vitamin B12 levels. Performed at [facility], [place] [address] [place], [place] [address]   VITAMIN D 25 Hydroxy (Vit-D Deficiency, Fractures)     Status: None   Collection Time: 11/01/18 11:23 AM  Result Value Ref Range   Vit D, 25-Hydroxy 40.58 30 - 100 ng/mL    Comment: (NOTE) Vitamin D deficiency has been defined by the Institute of Medicine  and an Endocrine Society practice guideline as a level of serum 25-OH  vitamin D less than 20 ng/mL (1,2). The Endocrine Society went on to  further define vitamin D insufficiency as a level between 21 and 29  ng/mL (2). 1. IOM (Institute of Medicine). 2010. Dietary reference intakes for  calcium and D. [place]: The [employer]. 2. Holick MF, Binkley Clayton, Bischoff-Ferrari HA, et al. Evaluation,  treatment, and prevention of vitamin D deficiency: an Endocrine  Society clinical practice guideline, JCEM. 2011 Jul; 96(7): 1911-30. Performed at [facility], [place] [address] [place], [place] [address]   Basic metabolic panel     Status: Abnormal   Collection Time: 11/02/18  6:27 AM  Result Value Ref Range   Sodium 142 135 -  145 mmol/L    Potassium 3.3 (L) 3.5 - 5.1 mmol/L   Chloride 106 98 - 111 mmol/L   CO2 30 22 - 32 mmol/L   Glucose, Bld 93 70 - 99 mg/dL   BUN 19 8 - 23 mg/dL   Creatinine, Ser 1.05 (H) 0.44 - 1.00 mg/dL   Calcium 8.9 8.9 - 10.3 mg/dL   GFR calc non Af Amer 52 (L) >60 mL/min   GFR calc Af Amer 60 (L) >60 mL/min   Anion gap 6 5 - 15    Comment: Performed at [facility], [address], [place], [place] [address]    Recent Results (from the past 240 hour(s))  CULTURE, URINE COMPREHENSIVE     Status: Abnormal   Collection Time: 10/30/18  1:42 PM   Specimen: Urine   UR  Result Value Ref Range Status   Urine Culture, Comprehensive Final report (A)  Final   Organism ID, Bacteria Comment (A)  Final    Comment: Streptococcus gallolyticus ssp gallolyticus (formerly S. bovis) 50,000-100,000 colony forming units per mL Susceptibility not normally performed on this organism.    Organism ID, Bacteria Comment  Final    Comment: Mixed urogenital flora 10,000-25,000 colony forming units per mL   Microscopic Examination     Status: Abnormal   Collection Time: 10/30/18  1:42 PM   URINE  Result Value Ref Range Status   WBC, UA 6-10 (A) 0 - 5 /hpf Final   RBC None seen 0 - 2 /hpf Final   Epithelial Cells (non renal) 0-10 0 - 10 /hpf Final   Renal Epithel, UA 0-10 (A) None seen /hpf Final   Casts Present (A) None seen /lpf Final   Cast Type Hyaline casts N/A Final   Bacteria, UA Few None seen/Few Final  SARS Coronavirus 2 [facility] order, Performed in [place] hospital lab) Nasopharyngeal Nasopharyngeal Swab     Status: None   Collection Time: 11/01/18 12:03 AM   Specimen: Nasopharyngeal Swab  Result Value Ref Range Status   SARS Coronavirus 2 NEGATIVE NEGATIVE Final    Comment: (NOTE) If result is NEGATIVE SARS-CoV-2 target nucleic acids are NOT DETECTED. The SARS-CoV-2 RNA is generally detectable in upper and lower  respiratory specimens during the acute phase of infection. The lowest   concentration of SARS-CoV-2 viral copies this assay can detect is 250  copies / mL. A negative result does not preclude SARS-CoV-2 infection  and should not be used as the sole basis for treatment or other  patient management decisions.  A negative result may occur with  improper specimen collection / handling, submission of specimen other  than nasopharyngeal swab, presence of viral mutation(s) within the  areas targeted by this assay, and inadequate number of viral copies  (<250 copies / mL). A negative result must be combined with clinical  observations, patient history, and epidemiological information. If result is POSITIVE SARS-CoV-2 target nucleic acids are DETECTED. The SARS-CoV-2 RNA is generally detectable in upper and lower  respiratory specimens dur ing the acute phase of infection.  Positive  results are indicative of active infection with SARS-CoV-2.  Clinical  correlation with patient history and other diagnostic information is  necessary to determine patient infection status.  Positive results do  not rule out bacterial infection or co-infection with other viruses. If result is PRESUMPTIVE POSTIVE SARS-CoV-2 nucleic acids MAY BE PRESENT.   A presumptive positive result was obtained on the submitted specimen  and confirmed on repeat testing.  While 2019 novel coronavirus  (SARS-CoV-2) nucleic acids may be present in the submitted sample  additional confirmatory testing may be necessary for epidemiological  and / or clinical management purposes  to differentiate between  SARS-CoV-2 and other Sarbecovirus currently known to infect humans.  If clinically indicated additional testing with an alternate test  methodology [phone]) is advised. The SARS-CoV-2 RNA is generally  detectable in upper and lower respiratory sp ecimens during the acute  phase of infection. The expected result is Negative. Fact Sheet for Patients:  StrictlyIdeas.no Fact Sheet  for Healthcare Providers: BankingDealers.co.za This test is not yet approved or cleared by the [place] FDA and has been authorized for detection and/or diagnosis of SARS-CoV-2 by FDA under an Emergency Use Authorization (EUA).  This EUA will remain in effect (meaning this test can be used) for the duration of the COVID-19 declaration under Section 564(b)(1) of the Act, 21 U.S.C. section 360bbb-3(b)(1), unless the authorization is terminated or revoked sooner. Performed at [facility], [place], [place] [address]     Ct Head Wo Contrast  Result Date: 10/31/2018 CLINICAL DATA:  Fall, injury to the back of the head and back the EXAM: CT HEAD WITHOUT CONTRAST; CT CERVICAL SPINE WITHOUT CONTRAST TECHNIQUE: Contiguous axial images were obtained from the base of the skull through the vertex without intravenous contrast. COMPARISON:  None. FINDINGS: Brain: No evidence of acute territorial infarction, hemorrhage, hydrocephalus,extra-axial collection or mass lesion/mass effect. There is dilatation the ventricles and sulci consistent with age-related atrophy. Low-attenuation changes in the deep white matter consistent with small vessel ischemia. Vascular: No hyperdense vessel or unexpected calcification. Skull: The skull is intact. No fracture or focal lesion identified. Sinuses/Orbits: The visualized paranasal sinuses and mastoid air cells are clear. The orbits and globes intact. Other: None Cervical spine: Alignment: Physiologic Skull base and vertebrae: Visualized skull base is intact. No atlanto-occipital dissociation. The patient is status post ACDF at C5-C6. Solid interbody fusion is seen at C5-C6. No periprosthetic lucency or fracture is identified. Soft tissues and spinal canal: The visualized paraspinal soft tissues are unremarkable. No prevertebral soft tissue swelling is seen. The spinal canal is grossly unremarkable, no large epidural collection  or significant canal narrowing. Disc levels: Multilevel disc osteophyte complex with anterior osteophytes and uncovertebral osteophytes are noted. This is most notable at C3-C4 and C4-C5 with neural foraminal stenosis. Upper chest: The lung apices are clear. Thoracic inlet is within normal limits. Other: None IMPRESSION: No acute intracranial abnormality. Findings consistent with age related atrophy and chronic small vessel ischemia No acute fracture or malalignment of the spine. Status post ACDF at C5-C6 without hardware complication. Electronically Signed   By: Prudencio Pair M.D.   On: 10/31/2018 20:54   Ct Cervical Spine Wo Contrast  Result Date: 10/31/2018 CLINICAL DATA:  Fall, injury to the back of the head and back the EXAM: CT HEAD WITHOUT CONTRAST; CT CERVICAL SPINE WITHOUT CONTRAST TECHNIQUE: Contiguous axial images were obtained from the base of the skull through the vertex without intravenous contrast. COMPARISON:  None. FINDINGS: Brain: No evidence of acute territorial infarction, hemorrhage, hydrocephalus,extra-axial collection or mass lesion/mass effect. There is dilatation the ventricles and sulci consistent with age-related atrophy. Low-attenuation changes in the deep white matter consistent with small vessel ischemia. Vascular: No hyperdense vessel or unexpected calcification. Skull: The skull is intact. No fracture or focal lesion identified. Sinuses/Orbits: The visualized paranasal sinuses and mastoid air cells are clear. The orbits and globes intact. Other: None  Cervical spine: Alignment: Physiologic Skull base and vertebrae: Visualized skull base is intact. No atlanto-occipital dissociation. The patient is status post ACDF at C5-C6. Solid interbody fusion is seen at C5-C6. No periprosthetic lucency or fracture is identified. Soft tissues and spinal canal: The visualized paraspinal soft tissues are unremarkable. No prevertebral soft tissue swelling is seen. The spinal canal is grossly  unremarkable, no large epidural collection or significant canal narrowing. Disc levels: Multilevel disc osteophyte complex with anterior osteophytes and uncovertebral osteophytes are noted. This is most notable at C3-C4 and C4-C5 with neural foraminal stenosis. Upper chest: The lung apices are clear. Thoracic inlet is within normal limits. Other: None IMPRESSION: No acute intracranial abnormality. Findings consistent with age related atrophy and chronic small vessel ischemia No acute fracture or malalignment of the spine. Status post ACDF at C5-C6 without hardware complication. Electronically Signed   By: Prudencio Pair M.D.   On: 10/31/2018 20:54   Ct Lumbar Spine Wo Contrast  Result Date: 10/31/2018 CLINICAL DATA:  Back pain, trauma EXAM: CT LUMBAR SPINE WITHOUT CONTRAST TECHNIQUE: Multidetector CT imaging of the lumbar spine was performed without intravenous contrast administration. Multiplanar CT image reconstructions were also generated. COMPARISON:  None. FINDINGS: Segmentation: There are 5 non-rib bearing lumbar type vertebral bodies with the last intervertebral disc space labeled as L5-S1. Alignment: There is a mild levoconvex scoliotic curvature of the lumbar spine. Vertebrae: The vertebral body heights are well maintained. No fracture, malalignment, or pathologic osseous lesions seen. Paraspinal and other soft tissues: The paraspinal soft tissues and visualized retroperitoneal structures are unremarkable. The sacroiliac joints are intact. Scattered aortic atherosclerosis is seen. 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 IMPRESSION: No acute fracture or malalignment of the spine. Electronically Signed   By: Prudencio Pair M.D.   On: 10/31/2018 20:57   Dg Chest Portable 1 View  Result Date: 10/31/2018 CLINICAL DATA:  Fall.   Dizziness. EXAM: PORTABLE CHEST 1 VIEW COMPARISON:  04/06/2018 FINDINGS: The heart size and mediastinal contours are within normal limits. Both lungs are clear. The visualized skeletal structures are unremarkable. Unchanged position of left chest wall cardiac device. IMPRESSION: No active disease. Electronically Signed   By: Ulyses Jarred M.D.   On: 10/31/2018 21:11   Dg Shoulder Left  Result Date: 10/31/2018 CLINICAL DATA:  78 year old who fell five days ago and complains of persistent LEFT shoulder pain and RIGHT hip pain. Initial encounter. EXAM: LEFT SHOULDER - 2+ VIEW COMPARISON:  None. FINDINGS: No evidence of acute, subacute or healed fracture. Glenohumeral joint anatomically aligned with well-preserved joint space. Subacromial space well-preserved. Acromioclavicular joint anatomically aligned without significant degenerative changes. Well preserved bone mineral density. No intrinsic osseous abnormality. IMPRESSION: Normal examination. Electronically Signed   By: Evangeline Dakin M.D.   On: 10/31/2018 21:06   Dg Hip Unilat W Or Wo Pelvis 2-3 Views Right  Result Date: 10/31/2018 CLINICAL DATA:  Hip pain after fall EXAM: DG HIP (WITH OR WITHOUT PELVIS) 2-3V RIGHT COMPARISON:  None. FINDINGS: There is no evidence of hip fracture or dislocation. There is no evidence of arthropathy or other focal bone abnormality. IMPRESSION: Negative. Electronically Signed   By: Ulyses Jarred M.D.   On: 10/31/2018 21:10     Assessment/Plan: 78 y/o admitted for gait instabilty, ataxia and frequent fall. 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 For the time being, since she has been evaluated by neurologist and no new findings when compared to previous evaluation I would not pursue further  neurological exploration. I would not change or adjust  any of her current medications at time being unless necessary neverthleess we need to r/out any infectious, metabolic/electrolytes abnlities (since patient is a very limited historian) and if patient does not have any acute findings she may benefit of rehabilitation. Meanwhile while admitted neuroportectives measures including normothermia, normoglycemia, correct any electrolytes/metaboilc abnilites, orthostatics.  I discussed the case and my medical plan with Dr. Manuella Ghazi, who is her neurologist, who agrees with medical plan. Dr. Manuella Ghazi will follow her up on OTP basis    11/02/2018, 10:06 AM

## 2018-11-02 NOTE — Progress Notes (Signed)
[facility] [place] [facility]     PATIENT NAME: Maria OverlandMary Horton    MR#:  [id number]  DATE OF BIRTH:  [birth year]  SUBJECTIVE:   Patient presents to the hospital due to recurrent falls.  No other acute events overnight.  Patient denies any headache, focal weakness.  She does have a tremor but worse with movement.  REVIEW OF SYSTEMS:    Review of Systems  Constitutional: Negative for chills and fever.  HENT: Negative for congestion and tinnitus.   Eyes: Negative for blurred vision and double vision.  Respiratory: Negative for cough, shortness of breath and wheezing.   Cardiovascular: Negative for chest pain, orthopnea and PND.  Gastrointestinal: Negative for abdominal pain, diarrhea, nausea and vomiting.  Genitourinary: Negative for dysuria and hematuria.  Musculoskeletal: Positive for falls.  Neurological: Negative for dizziness, sensory change and focal weakness.  All other systems reviewed and are negative.   Nutrition: Heart Healthy Tolerating Diet:  Yes Tolerating PT: Eval noted.   DRUG ALLERGIES:   Allergies  Allergen Reactions   5-Alpha Reductase Inhibitors    Ace Inhibitors Other (See Comments)    achy   Beta Adrenergic Blockers Other (See Comments)    Makes achy/feel sick   Calcium Channel Blockers Other (See Comments)    Feels achy like sick   Latex Rash   Tape Rash    VITALS:  Blood pressure (!) 110/52, pulse 60, temperature 98.7 F (37.1 C), temperature source Oral, resp. rate 18, height 5\' 4"  (1.626 m), weight 65.7 kg, SpO2 94 %.  PHYSICAL EXAMINATION:   Physical Exam  GENERAL:  78 y.o.-year-old patient lying in bed in no acute distress.  EYES: Pupils equal, round, reactive to light and accommodation. No scleral icterus. Extraocular muscles intact.  HEENT: Head atraumatic, normocephalic. Oropharynx and nasopharynx clear.  NECK:  Supple, no jugular venous distention. No thyroid enlargement, no tenderness.  LUNGS: Normal  breath sounds bilaterally, no wheezing, rales, rhonchi. No use of accessory muscles of respiration.  CARDIOVASCULAR: S1, S2 normal. No murmurs, rubs, or gallops.  ABDOMEN: Soft, nontender, nondistended. Bowel sounds present. No organomegaly or mass.  EXTREMITIES: No cyanosis, clubbing or edema b/l.    NEUROLOGIC: Cranial nerves II through XII are intact. No focal Motor or sensory deficits b/l. + Intentional Tremor. Globally weak.    PSYCHIATRIC: The patient is alert and oriented x 3.  SKIN: No obvious rash, lesion, or ulcer.    LABORATORY PANEL:   CBC Recent Labs  Lab 11/01/18 0527  WBC 7.2  HGB 11.9*  HCT 36.4  PLT 159   ------------------------------------------------------------------------------------------------------------------  Chemistries  Recent Labs  Lab 11/02/18 0627  NA 142  K 3.3*  CL 106  CO2 30  GLUCOSE 93  BUN 19  CREATININE 1.05*  CALCIUM 8.9   ------------------------------------------------------------------------------------------------------------------  Cardiac Enzymes No results for input(s): TROPONINI in the last 168 hours. ------------------------------------------------------------------------------------------------------------------  RADIOLOGY:  Ct Head Wo Contrast  Result Date: 10/31/2018 CLINICAL DATA:  Fall, injury to the back of the head and back the EXAM: CT HEAD WITHOUT CONTRAST; CT CERVICAL SPINE WITHOUT CONTRAST TECHNIQUE: Contiguous axial images were obtained from the base of the skull through the vertex without intravenous contrast. COMPARISON:  None. FINDINGS: Brain: No evidence of acute territorial infarction, hemorrhage, hydrocephalus,extra-axial collection or mass lesion/mass effect. There is dilatation the ventricles and sulci consistent with age-related atrophy. Low-attenuation changes in the deep white matter consistent with small vessel ischemia. Vascular: No hyperdense vessel or unexpected calcification. Skull: The skull  is  intact. No fracture or focal lesion identified. Sinuses/Orbits: The visualized paranasal sinuses and mastoid air cells are clear. The orbits and globes intact. Other: None Cervical spine: Alignment: Physiologic Skull base and vertebrae: Visualized skull base is intact. No atlanto-occipital dissociation. The patient is status post ACDF at C5-C6. Solid interbody fusion is seen at C5-C6. No periprosthetic lucency or fracture is identified. Soft tissues and spinal canal: The visualized paraspinal soft tissues are unremarkable. No prevertebral soft tissue swelling is seen. The spinal canal is grossly unremarkable, no large epidural collection or significant canal narrowing. Disc levels: Multilevel disc osteophyte complex with anterior osteophytes and uncovertebral osteophytes are noted. This is most notable at C3-C4 and C4-C5 with neural foraminal stenosis. Upper chest: The lung apices are clear. Thoracic inlet is within normal limits. Other: None IMPRESSION: No acute intracranial abnormality. Findings consistent with age related atrophy and chronic small vessel ischemia No acute fracture or malalignment of the spine. Status post ACDF at C5-C6 without hardware complication. Electronically Signed   By: Prudencio Pair M.D.   On: 10/31/2018 20:54   Ct Cervical Spine Wo Contrast  Result Date: 10/31/2018 CLINICAL DATA:  Fall, injury to the back of the head and back the EXAM: CT HEAD WITHOUT CONTRAST; CT CERVICAL SPINE WITHOUT CONTRAST TECHNIQUE: Contiguous axial images were obtained from the base of the skull through the vertex without intravenous contrast. COMPARISON:  None. FINDINGS: Brain: No evidence of acute territorial infarction, hemorrhage, hydrocephalus,extra-axial collection or mass lesion/mass effect. There is dilatation the ventricles and sulci consistent with age-related atrophy. Low-attenuation changes in the deep white matter consistent with small vessel ischemia. Vascular: No hyperdense vessel or unexpected  calcification. Skull: The skull is intact. No fracture or focal lesion identified. Sinuses/Orbits: The visualized paranasal sinuses and mastoid air cells are clear. The orbits and globes intact. Other: None Cervical spine: Alignment: Physiologic Skull base and vertebrae: Visualized skull base is intact. No atlanto-occipital dissociation. The patient is status post ACDF at C5-C6. Solid interbody fusion is seen at C5-C6. No periprosthetic lucency or fracture is identified. Soft tissues and spinal canal: The visualized paraspinal soft tissues are unremarkable. No prevertebral soft tissue swelling is seen. The spinal canal is grossly unremarkable, no large epidural collection or significant canal narrowing. Disc levels: Multilevel disc osteophyte complex with anterior osteophytes and uncovertebral osteophytes are noted. This is most notable at C3-C4 and C4-C5 with neural foraminal stenosis. Upper chest: The lung apices are clear. Thoracic inlet is within normal limits. Other: None IMPRESSION: No acute intracranial abnormality. Findings consistent with age related atrophy and chronic small vessel ischemia No acute fracture or malalignment of the spine. Status post ACDF at C5-C6 without hardware complication. Electronically Signed   By: Prudencio Pair M.D.   On: 10/31/2018 20:54   Ct Lumbar Spine Wo Contrast  Result Date: 10/31/2018 CLINICAL DATA:  Back pain, trauma EXAM: CT LUMBAR SPINE WITHOUT CONTRAST TECHNIQUE: Multidetector CT imaging of the lumbar spine was performed without intravenous contrast administration. Multiplanar CT image reconstructions were also generated. COMPARISON:  None. FINDINGS: Segmentation: There are 5 non-rib bearing lumbar type vertebral bodies with the last intervertebral disc space labeled as L5-S1. Alignment: There is a mild levoconvex scoliotic curvature of the lumbar spine. Vertebrae: The vertebral body heights are well maintained. No fracture, malalignment, or pathologic osseous lesions  seen. Paraspinal and other soft tissues: The paraspinal soft tissues and visualized retroperitoneal structures are unremarkable. The sacroiliac joints are intact. Scattered aortic atherosclerosis is seen. Disc levels: Multilevel lumbar spine  spondylosis is seen with facet arthropathy and a broad-based disc bulge most notable at L4-L5 and L5-S1 with mild central canal narrowing and moderate neural foraminal stenosis. IMPRESSION: No acute fracture or malalignment of the spine. Electronically Signed   By: Jonna Clark M.D.   On: 10/31/2018 20:57   Dg Chest Portable 1 View  Result Date: 10/31/2018 CLINICAL DATA:  Fall.  Dizziness. EXAM: PORTABLE CHEST 1 VIEW COMPARISON:  04/06/2018 FINDINGS: The heart size and mediastinal contours are within normal limits. Both lungs are clear. The visualized skeletal structures are unremarkable. Unchanged position of left chest wall cardiac device. IMPRESSION: No active disease. Electronically Signed   By: Deatra Robinson M.D.   On: 10/31/2018 21:11   Dg Shoulder Left  Result Date: 10/31/2018 CLINICAL DATA:  78 year old who fell five days ago and complains of persistent LEFT shoulder pain and RIGHT hip pain. Initial encounter. EXAM: LEFT SHOULDER - 2+ VIEW COMPARISON:  None. FINDINGS: No evidence of acute, subacute or healed fracture. Glenohumeral joint anatomically aligned with well-preserved joint space. Subacromial space well-preserved. Acromioclavicular joint anatomically aligned without significant degenerative changes. Well preserved bone mineral density. No intrinsic osseous abnormality. IMPRESSION: Normal examination. Electronically Signed   By: Hulan Saas M.D.   On: 10/31/2018 21:06   Dg Hip Unilat W Or Wo Pelvis 2-3 Views Right  Result Date: 10/31/2018 CLINICAL DATA:  Hip pain after fall EXAM: DG HIP (WITH OR WITHOUT PELVIS) 2-3V RIGHT COMPARISON:  None. FINDINGS: There is no evidence of hip fracture or dislocation. There is no evidence of arthropathy or other  focal bone abnormality. IMPRESSION: Negative. Electronically Signed   By: Deatra Robinson M.D.   On: 10/31/2018 21:10     ASSESSMENT AND PLAN:   78 year old female with past medical history of vascular parkinsonism, depression/Anxiety, restless leg syndrome, hyperlipidemia, essential hypertension who presented to the hospital due to recurrent falls.  1.  Ataxia/recurrent falls- etiology unclear.  Patient had a CT head which was negative for acute pathology.  Had an extensive neurologic work-up as an outpatient with Dr. Sherryll Burger including MRI of the brain which was negative.  Patient has been having worsening weakness and falls at home which has been attributed to possible worsening parkinsonian tremor. - Seen by neurology.  No plans for further imaging or testing at this time. - Continue PT and OT as tolerated.  PT is recommending CIR but likely she would benefit from SNF.  2.  History of restless leg syndrome-continue Requip.  3.  Essential hypertension- continue triamterene/HCTZ, metoprolol.  4.  Hyperlipidemia-continue Pravachol.  5.  Anxiety/depression-continue Celexa, Wellbutrin, Klonopin.  6.  Urinary incontinence-continue Myrbetriq.     All the records are reviewed and case discussed with Care Management/Social Worker. Management plans discussed with the patient, family and they are in agreement.  CODE STATUS: Partial Code  DVT Prophylaxis: Lovenox  TOTAL TIME TAKING CARE OF THIS PATIENT: 30 minutes.   POSSIBLE D/C IN 1-2 DAYS, DEPENDING ON CLINICAL CONDITION.   Houston Siren M.D on 11/02/2018 at 1:23 PM  Between 7am to 6pm - Pager - [phone]  After 6pm go to [email or web address] - Social research officer, government  [facility]  Office  [phone]  CC: Primary care physician; Gracelyn Nurse, MD

## 2018-11-02 NOTE — Progress Notes (Signed)
OT Cancellation Note  Patient Details Name: Maria Horton MRN: [id number] DOB: Aug 31, 1940   Cancelled Treatment:    Reason Eval/Treat Not Completed: Patient at procedure or test/ unavailable(MD came in to see pt. as the OT initial eval was being initiated. Will reattempt the initial OT eval at a later time/date.)  Harrel Carina, MS, OTR/L 11/02/2018, 9:08 AM

## 2018-11-02 NOTE — Evaluation (Addendum)
Occupational Therapy Evaluation Patient Details Name: Maria Horton MRN: [id number] DOB: 09-15-1940 Today's Date: 11/02/2018    History of Present Illness Pt. is an 78 y.o. female who presented to ER secondary to ataxia, gait imbalance with multiple recent falls; admited for TIA/CVA work up.  Head CT, spinal imaging, L shoulder and R hip imaging all negative for acute injury. 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   Clinical Impression   Pt. presents with Ataxia, weakness, incorrdination, limited activity tolerance, and limited functional mobility which hinders her ability to complete basic ADL and IADL functioning. Pt. resides at home alone, and has a daughter who checks in frequently. Pt. Has personal care aides 2 days a week for 3 hours, who assist with morning ADL tasks. Pt. required assist with meal preparation, medication management, and home management tasks from her daughter. Pt. could benefit from OT services for ADL training, A/E training, and pt. education about home modification, and DME. Pt. would benefit from CIR upon discharge, with follow-up OT services to maximize independence with ADLs/IADLs, and work towards returning to her prior level of function.    Follow Up Recommendations  CIR    Equipment Recommendations    None   Recommendations for Other Services       Precautions / Restrictions Precautions Precautions: Fall;ICD/Pacemaker Restrictions Weight Bearing Restrictions: No      Mobility Bed Mobility               General bed mobility comments: Deferred  Transfers                 General transfer comment: Deferred    Balance                                           ADL either performed or assessed with clinical judgement   ADL Overall ADL's : Needs assistance/impaired Eating/Feeding: Set up;Minimal assistance Eating/Feeding Details (indicate cue type and reason): spillage with utensil use Grooming: Set up;Min guard   Upper Body Bathing: Set up;Minimal assistance   Lower Body Bathing: Set up;Moderate assistance   Upper Body Dressing : Set up;Minimal assistance   Lower Body Dressing: Set up;Moderate assistance                       Vision Baseline Vision/History: Wears glasses Wears Glasses: At all times Patient Visual Report: No change from baseline       Perception     Praxis      Pertinent Vitals/Pain Pain Assessment: No/denies pain Pain Location: L shoulder     Hand Dominance Right   Extremity/Trunk Assessment Upper Extremity Assessment Upper Extremity Assessment: Generalized weakness(Incoordination)           Communication Communication Communication: No difficulties   Cognition Arousal/Alertness: Awake/alert Behavior During Therapy: WFL for tasks assessed/performed Overall Cognitive Status: No family/caregiver present to determine baseline cognitive functioning                                     General Comments       Exercises     Shoulder Instructions      Home Living Family/patient expects to be discharged to:: Private residence Living Arrangements: Alone Available Help at Discharge: Family;Available PRN/intermittently Type of Home: House Home Access:  Stairs to enter [employer] of Steps: 3 Entrance Stairs-Rails: None Home Layout: One level     Bathroom Shower/Tub: Occupational psychologist: Standard     Home Equipment: Environmental consultant - 2 wheels;Walker - 4 wheels;Cane - single point;Bedside commode;Shower seat;Hand held shower head          Prior Functioning/Environment Level of Independence: Needs assistance    ADL's / Homemaking Assistance Needed: Pt. has a home care aide 2 days a week for 3 hours, who assists with bathing/morning care. Pt.'s daughter assists with medication management, and meals. Pt. reports that her does checks in on her frequently.            OT Problem List: Decreased  strength;Pain;Decreased coordination;Impaired UE functional use      OT Treatment/Interventions: Self-care/ADL training;DME and/or AE instruction;Therapeutic activities;Neuromuscular education;Patient/family education;Therapeutic exercise    OT Goals(Current goals can be found in the care plan section) Acute Rehab OT Goals Patient Stated Goal: To improve independence OT Goal Formulation: With patient Potential to Achieve Goals: Good  OT Frequency: Min 2X/week   Barriers to D/C:            Co-evaluation              AM-PAC OT "6 Clicks" Daily Activity     Outcome Measure Help from another person eating meals?: A Little Help from another person taking care of personal grooming?: A Little Help from another person toileting, which includes using toliet, bedpan, or urinal?: A Lot Help from another person bathing (including washing, rinsing, drying)?: A Lot Help from another person to put on and taking off regular upper body clothing?: A Little Help from another person to put on and taking off regular lower body clothing?: A Lot 6 Click Score: 15   End of Session    Activity Tolerance: Patient tolerated treatment well Patient left: in bed  OT Visit Diagnosis: Unsteadiness on feet (R26.81);Muscle weakness (generalized) (M62.81);History of falling (Z91.81)                Time: 7225-7505 OT Time Calculation (min): 21 min Charges:  OT General Charges $OT Visit: 1 Visit OT Evaluation $OT Eval Low Complexity: 1 Low  Harrel Carina, MS, OTR/L  Harrel Carina 11/02/2018, 10:19 AM

## 2018-11-02 NOTE — TOC Initial Note (Signed)
Transition of Care [facility]) - Initial/Assessment Note    Patient Details  Name: Maria Horton MRN: [id number] Date of Birth: 1940/07/21  [facility]) CM/SW Contact:    Allayne Butcher, RN Phone Number: 11/02/2018, 2:37 PM  Clinical Narrative:                 Patient admitted for ataxia after having a fall, patient has been having frequent falling episodes at home.  Patient lives in [place] by herself but she reports that her daughter does spend the night frequently.  Patient has a rollator at home and gets PT with [facility].  RNCM did discuss CIR and SNF with patient and told her to think about those options.  Patient reports that she would like to improve her balance and strength.   RNCM will cont to follow.   Expected Discharge Plan: Home w Home Health Services Barriers to Discharge: Continued Medical Work up   Patient Goals and CMS Choice Patient states their goals for this hospitalization and ongoing recovery are:: Patient would like to work on her balance and become more steady CMS Medicare.gov Compare Post Acute Care list provided to:: Patient Choice offered to / list presented to : Patient  Expected Discharge Plan and Services Expected Discharge Plan: Home w Home Health Services   Discharge Planning Services: CM Consult Post Acute Care Choice: Resumption of Svcs/PTA Provider Living arrangements for the past 2 months: Single Family Home                             HH Agency: [facility] ([facility])        Prior Living Arrangements/Services Living arrangements for the past 2 months: Single Family Home Lives with:: Self Patient language and need for interpreter reviewed:: No Do you feel safe going back to the place where you live?: Yes      Need for Family Participation in Patient Care: Yes (Comment)(weakness, balance issues) Care giver support system in place?: Yes (comment)(daughter) Current home services: Home PT, DME(rollator,  shower chair) Criminal Activity/Legal Involvement Pertinent to Current Situation/Hospitalization: No - Comment as needed  Activities of Daily Living Home Assistive Devices/Equipment: Environmental consultant (specify type), Shower chair with back ADL Screening (condition at time of admission) Patient's cognitive ability adequate to safely complete daily activities?: Yes Is the patient deaf or have difficulty hearing?: No Does the patient have difficulty seeing, even when wearing glasses/contacts?: No Does the patient have difficulty concentrating, remembering, or making decisions?: No Patient able to express need for assistance with ADLs?: Yes Does the patient have difficulty dressing or bathing?: Yes Independently performs ADLs?: Yes (appropriate for developmental age) Does the patient have difficulty walking or climbing stairs?: Yes Weakness of Legs: Both Weakness of Arms/Hands: None  Permission Sought/Granted Permission sought to share information with : Case Manager, Family Supports, Other (comment) Permission granted to share information with : Yes, Verbal Permission Granted     Permission granted to share info w AGENCY: [facility]  Permission granted to share info w Relationship: Daughter     Emotional Assessment Appearance:: Appears stated age Attitude/Demeanor/Rapport: Engaged Affect (typically observed): Accepting Orientation: : Oriented to Self, Oriented to Place, Oriented to  Time, Oriented to Situation Alcohol / Substance Use: Not Applicable Psych Involvement: No (comment)  Admission diagnosis:  Dysequilibrium [R42] Fall, initial encounter [W19.XXXA] Patient Active Problem List   Diagnosis Date Noted  . 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 Sick  sinus syndrome ([place]) 12/14/2016  . Fall at home, initial encounter 11/14/2016  . TIA (transient ischemic attack) 09/10/2016  . Chronic pain syndrome 09/30/2015  . GERD (gastroesophageal reflux disease) 09/29/2015  . Severe recurrent major  depression without psychotic features ([place]) 09/26/2015  . Fibromyalgia 09/26/2015  . Osteoarthritis 09/26/2015  . Hypertension 09/26/2015   PCP:  Baxter Hire, MD Pharmacy:   [id number] [place], [address] [place] [address] Phone: [phone] Fax: [phone]     Social Determinants of Health (SDOH) Interventions    Readmission Risk Interventions No flowsheet data found.

## 2018-11-03 LAB — MAGNESIUM: Magnesium: 1.8 mg/dL (ref 1.7–2.4)

## 2018-11-03 LAB — POTASSIUM: Potassium: 3.4 mmol/L — ABNORMAL LOW (ref 3.5–5.1)

## 2018-11-03 MED ORDER — MIRABEGRON ER 25 MG PO TB24
50.0000 mg | ORAL_TABLET | Freq: Every day | ORAL | Status: DC
  Administered 2018-11-03: 22:00:00 50 mg via ORAL
  Filled 2018-11-03 (×2): qty 2

## 2018-11-03 MED ORDER — POTASSIUM CHLORIDE CRYS ER 20 MEQ PO TBCR
40.0000 meq | EXTENDED_RELEASE_TABLET | Freq: Once | ORAL | Status: AC
  Administered 2018-11-03: 16:00:00 40 meq via ORAL
  Filled 2018-11-03: qty 2

## 2018-11-03 NOTE — Progress Notes (Signed)
[facility] [place] [facility]     PATIENT NAME: Maria Horton    MR#:  [id number]  DATE OF BIRTH:  [birth year]  SUBJECTIVE:   No acute events overnight, daughter is at bedside.  Daughter has agreed to short-term rehab placement.  REVIEW OF SYSTEMS:    Review of Systems  Constitutional: Negative for chills and fever.  HENT: Negative for congestion and tinnitus.   Eyes: Negative for blurred vision and double vision.  Respiratory: Negative for cough, shortness of breath and wheezing.   Cardiovascular: Negative for chest pain, orthopnea and PND.  Gastrointestinal: Negative for abdominal pain, diarrhea, nausea and vomiting.  Genitourinary: Negative for dysuria and hematuria.  Musculoskeletal: Positive for falls.  Neurological: Negative for dizziness, sensory change and focal weakness.  All other systems reviewed and are negative.   Nutrition: Heart Healthy Tolerating Diet:  Yes Tolerating PT: Eval noted.   DRUG ALLERGIES:   Allergies  Allergen Reactions  . 5-Alpha Reductase Inhibitors   . Ace Inhibitors Other (See Comments)    achy  . Beta Adrenergic Blockers Other (See Comments)    Makes achy/feel sick  . Calcium Channel Blockers Other (See Comments)    Feels achy like sick  . Latex Rash  . Tape Rash    VITALS:  Blood pressure (!) 126/98, pulse 66, temperature 98.9 F (37.2 C), temperature source Oral, resp. rate 20, height 5\' 4"  (1.626 m), weight 65.7 kg, SpO2 97 %.  PHYSICAL EXAMINATION:   Physical Exam  GENERAL:  78 y.o.-year-old patient lying in bed in no acute distress.  EYES: Pupils equal, round, reactive to light and accommodation. No scleral icterus. Extraocular muscles intact.  HEENT: Head atraumatic, normocephalic. Oropharynx and nasopharynx clear.  NECK:  Supple, no jugular venous distention. No thyroid enlargement, no tenderness.  LUNGS: Normal breath sounds bilaterally, no wheezing, rales, rhonchi. No use of accessory muscles of  respiration.  CARDIOVASCULAR: S1, S2 normal. No murmurs, rubs, or gallops.  ABDOMEN: Soft, nontender, nondistended. Bowel sounds present. No organomegaly or mass.  EXTREMITIES: No cyanosis, clubbing or edema b/l.    NEUROLOGIC: Cranial nerves II through XII are intact. No focal Motor or sensory deficits b/l. + Intentional Tremor. Globally weak.    PSYCHIATRIC: The patient is alert and oriented x 3.  SKIN: No obvious rash, lesion, or ulcer.    LABORATORY PANEL:   CBC Recent Labs  Lab 11/01/18 0527  WBC 7.2  HGB 11.9*  HCT 36.4  PLT 159   ------------------------------------------------------------------------------------------------------------------  Chemistries  Recent Labs  Lab 11/02/18 0627 11/03/18 0533  NA 142  --   K 3.3* 3.4*  CL 106  --   CO2 30  --   GLUCOSE 93  --   BUN 19  --   CREATININE 1.05*  --   CALCIUM 8.9  --   MG  --  1.8   ------------------------------------------------------------------------------------------------------------------  Cardiac Enzymes No results for input(s): TROPONINI in the last 168 hours. ------------------------------------------------------------------------------------------------------------------  RADIOLOGY:  No results found.   ASSESSMENT AND PLAN:   78 year old female with past medical history of vascular parkinsonism, depression/Anxiety, restless leg syndrome, hyperlipidemia, essential hypertension who presented to the hospital due to recurrent falls.  1.  Ataxia/recurrent falls-  Patient had a CT head which was negative for acute pathology.  Had an extensive neurologic work-up as an outpatient with Dr. 73 including MRI of the brain which was negative.  Patient has been having worsening weakness and falls at home which has been attributed  to possible worsening parkinsonian tremor. Seen by neurology and no plans for further imaging or intervention at this time. -Continue PT and OT as tolerated.  Physical therapy  initially suggesting continuous inpatient rehab but patient would not qualify for that and therefore social work is working on short-term rehab placement.  2.  History of restless leg syndrome-continue Requip.  3.  Essential hypertension- continue triamterene/HCTZ, metoprolol.  4.  Hyperlipidemia-continue Pravachol.  5.  Anxiety/depression-continue Celexa, Wellbutrin, Klonopin.  6.  Urinary incontinence-continue Myrbetriq.  7. Inner ear fluid -on admission ER physician looked at patient's ears and thought she had some fluid in her ears bilaterally.  There is no evidence of acute otitis media or externa.  They recommended possible ENT eval.  I sent a message to Dr. Richardson Landry via epic haiku chat who looked at the patient's images and chart and does not think that the patient needs acute ENT evaluation for this.  He recommended outpatient follow-up with ENT for vestibular testing and possible consideration for nasal steroids if needed.  Patient shows no evidence of allergic rhinitis and therefore will hold off on treatment.  She can follow-up with ENT as outpatient.  Discharge to short-term rehab over the weekend.  All the records are reviewed and case discussed with Care Management/Social Worker. Management plans discussed with the patient, family and they are in agreement.  CODE STATUS: Partial Code  DVT Prophylaxis: Lovenox  TOTAL TIME TAKING CARE OF THIS PATIENT: 30 minutes.   POSSIBLE D/C IN 1-2 DAYS, DEPENDING ON CLINICAL CONDITION.   Henreitta Leber M.D on 11/03/2018 at 1:53 PM  Between 7am to 6pm - Pager - [phone]  After 6pm go to [email or web address] - Proofreader  [facility] [place] [facility]  Office  [phone]  CC: Primary care physician; Baxter Hire, MD

## 2018-11-03 NOTE — Progress Notes (Signed)
Inpatient Rehabilitation-Admissions Coordinator   After reviewing neurology note and noting recurring falls at home and memory deficits, feel SNF is more appropriate to address current deficits. AC will not pursue CIR for this patient at this time.   Jhonnie Garner, OTR/L  Rehab Admissions Coordinator  [phone] 11/03/2018 10:30 AM

## 2018-11-03 NOTE — TOC Progression Note (Signed)
Transition of Care [facility]) - Progression Note    Patient Details  Name: Maria Horton MRN: [id number] Date of Birth: [birth year]  Transition of Care [facility]) CM/SW Contact  Shelbie Hutching, RN Phone Number: 11/03/2018, 2:11 PM  Clinical Narrative:    [facility] and [facility] can [facility] tomorrow.    Expected Discharge Plan: [place] Barriers to Discharge: Continued Medical Work up  Expected Discharge Plan and Services Expected Discharge Plan: [place]   Discharge Planning Services: CM Consult Post Acute Care Choice: [place] Living arrangements for the past 2 months: [place] Agency: [place] ([facility])         Social Determinants of Health (SDOH) Interventions    Readmission Risk Interventions No flowsheet data found.

## 2018-11-03 NOTE — NC FL2 (Signed)
[place] MEDICAID FL2 LEVEL OF CARE SCREENING TOOL     IDENTIFICATION  Patient Name: Maria Horton Birthdate: 04-25-1940 Sex: female Admission Date (Current Location): 10/31/2018  [place] and [place] Number:  Chiropodist and Address:  [facility], [address] [place], [place] [address]      Provider Number: [id number]  Attending Physician Name and Address:  Houston Siren, MD  Relative Name and Phone Number:  Celso Amy [phone]  (daughter)    Current Level of Care: Hospital Recommended Level of Care: Skilled Nursing Facility Prior Approval Number:    Date Approved/Denied:   PASRR Number: Pending  Discharge Plan: SNF    Current Diagnoses: Patient Active Problem List   Diagnosis Date Noted  . 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 Sick sinus syndrome ([facility]) 12/14/2016  . Fall at home, initial encounter 11/14/2016  . TIA (transient ischemic attack) 09/10/2016  . Chronic pain syndrome 09/30/2015  . GERD (gastroesophageal reflux disease) 09/29/2015  . Severe recurrent major depression without psychotic features ([facility]) 09/26/2015  . Fibromyalgia 09/26/2015  . Osteoarthritis 09/26/2015  . Hypertension 09/26/2015    Orientation RESPIRATION BLADDER Height & Weight     Self, Time, Situation, Place  Normal Continent Weight: 65.7 kg Height:  5\' 4"  (162.6 cm)  BEHAVIORAL SYMPTOMS/MOOD NEUROLOGICAL BOWEL NUTRITION STATUS      Continent Diet(Heart Healthy)  AMBULATORY STATUS COMMUNICATION OF NEEDS Skin   Supervision Verbally Normal                       Personal Care Assistance Level of Assistance  Bathing, Feeding, Dressing Bathing Assistance: Limited assistance Feeding assistance: Independent Dressing Assistance: Limited assistance     Functional Limitations Info             SPECIAL CARE FACTORS FREQUENCY  PT (By licensed PT), OT (By licensed OT)     PT Frequency: 5-7 days per week OT Frequency: 5 days per week           Contractures Contractures Info: Not present    Additional Factors Info  Code Status, Allergies Code Status Info: Partial - no mechanical ventilation perform CPR Allergies Info: 5-alpha reductase inhibitors, ace inhibitors, beta blockers, calcium channel blockers, latex, tape           Current Medications (11/03/2018):  This is the current hospital active medication list Current Facility-Administered Medications  Medication Dose Route Frequency Provider Last Rate Last Dose  . acetaminophen (TYLENOL) tablet 650 mg  650 mg Oral Q6H PRN 01/03/2019, MD       Or  . acetaminophen (TYLENOL) suppository 650 mg  650 mg Rectal Q6H PRN Oralia Manis, MD      . aspirin EC tablet 81 mg  81 mg Oral Daily Oralia Manis, MD   81 mg at 11/02/18 0955  . buPROPion (WELLBUTRIN XL) 24 hr tablet 300 mg  300 mg Oral Daily 01/02/19, MD   300 mg at 11/02/18 0956  . carbidopa-levodopa (SINEMET CR) 50-200 MG per tablet controlled release 1 tablet  1 tablet Oral QHS 01/02/19, MD   1 tablet at 11/02/18 1826  . carbidopa-levodopa (SINEMET IR) 25-100 MG per tablet immediate release 1 tablet  1 tablet Oral TID 01/02/19, MD   1 tablet at 11/02/18 1335  . cholecalciferol (VITAMIN D3) tablet 1,000 Units  1,000 Units Oral Daily 01/02/19, MD   1,000 Units at 11/02/18 0955  . citalopram (CELEXA) tablet 40 mg  40 mg Oral Daily Dustin Flock, MD   40 mg at 11/02/18 0954  . clonazePAM (KLONOPIN) tablet 0.5 mg  0.5 mg Oral QHS Dustin Flock, MD   0.5 mg at 11/02/18 2045  . clopidogrel (PLAVIX) tablet 75 mg  75 mg Oral Daily Dustin Flock, MD   75 mg at 11/02/18 0956  . docusate sodium (COLACE) capsule 100 mg  100 mg Oral BID Dustin Flock, MD   100 mg at 11/02/18 2046  . enoxaparin (LOVENOX) injection 40 mg  40 mg Subcutaneous Q24H Lance Coon, MD   40 mg at 11/03/18 0515  . HYDROcodone-acetaminophen (NORCO) 7.5-325 MG per tablet 1 tablet  1 tablet Oral Q6H PRN Dustin Flock, MD    1 tablet at 11/01/18 1005  . hydrOXYzine (ATARAX/VISTARIL) tablet 100 mg  100 mg Oral QHS PRN Dustin Flock, MD      . Melatonin TABS 10 mg  10 mg Oral QHS Dustin Flock, MD   10 mg at 11/02/18 2049  . metoprolol succinate (TOPROL-XL) 24 hr tablet 12.5 mg  12.5 mg Oral Daily Dustin Flock, MD   12.5 mg at 11/02/18 0951  . mirabegron ER (MYRBETRIQ) tablet 50 mg  50 mg Oral QHS Dustin Flock, MD   50 mg at 11/02/18 2049  . ondansetron (ZOFRAN) tablet 4 mg  4 mg Oral Q6H PRN Lance Coon, MD       Or  . ondansetron [facility]) injection 4 mg  4 mg Intravenous Q6H PRN Lance Coon, MD      . pantoprazole (PROTONIX) EC tablet 40 mg  40 mg Oral Daily Dustin Flock, MD   40 mg at 11/02/18 0955  . polyethylene glycol (MIRALAX / GLYCOLAX) packet 17 g  17 g Oral Daily Dustin Flock, MD   17 g at 11/02/18 0947  . pravastatin (PRAVACHOL) tablet 40 mg  40 mg Oral QPM Dustin Flock, MD   40 mg at 11/02/18 1826  . rOPINIRole (REQUIP) tablet 1 mg  1 mg Oral QHS Dustin Flock, MD   1 mg at 11/02/18 2045  . traZODone (DESYREL) tablet 200 mg  200 mg Oral QHS Dustin Flock, MD   200 mg at 11/02/18 2045  . triamterene-hydrochlorothiazide (MAXZIDE-25) 37.5-25 MG per tablet 0.5 tablet  0.5 tablet Oral Daily Dustin Flock, MD   0.5 tablet at 11/02/18 0953  . vitamin B-12 (CYANOCOBALAMIN) tablet 1,000 mcg  1,000 mcg Oral Daily Dustin Flock, MD   1,000 mcg at 11/02/18 0957     Discharge Medications: Please see discharge summary for a list of discharge medications.  Relevant Imaging Results:  Relevant Lab Results:   Additional Information SS# [id number]  Shelbie Hutching, RN

## 2018-11-03 NOTE — Progress Notes (Signed)
OT Cancellation Note  Patient Details Name: Maria Horton MRN: [id number] DOB: 1940/11/22   Cancelled Treatment:    Reason Eval/Treat Not Completed: Fatigue/lethargy limiting ability to participate. Pt sleeping soundly upon attempt. Will re-attempt OT tx at later date/time as pt is appropriate.   Jeni Salles, MPH, MS, OTR/L ascom [phone] 11/03/18, 2:12 PM

## 2018-11-03 NOTE — Care Management Important Message (Signed)
Important Message  Patient Details  Name: Maria Horton MRN: [id number] Date of Birth: May 25, 1940   Medicare Important Message Given:  Yes     Dannette Barbara 11/03/2018, 11:36 AM

## 2018-11-03 NOTE — TOC Progression Note (Signed)
Transition of Care [facility]) - Progression Note    Patient Details  Name: Maria Horton MRN: [id number] Date of Birth: [birth year]  [facility]) CM/SW Contact  Shelbie Hutching, RN Phone Number: 11/03/2018, 10:29 AM  Clinical Narrative:    Daughter is at the patient bedside.  RNCM discussed discharge planning with both patient and daughter.  Patient and daughter would like to try short term rehab.  Patient lives in [place] so [facility] is the top choice.  Bed referral process started.  Patient should be ready for discharge tomorrow, [place] started and pending.    Expected Discharge Plan: [place] Barriers to Discharge: Continued Medical Work up  Expected Discharge Plan and Services Expected Discharge Plan: [place]   Discharge Planning Services: CM Consult Post Acute Care Choice: [place] Living arrangements for the past 2 months: [place] Agency: [place] ([facility])         Social Determinants of Health (SDOH) Interventions    Readmission Risk Interventions No flowsheet data found.

## 2018-11-04 ENCOUNTER — Inpatient Hospital Stay: Payer: Medicare Other

## 2018-11-04 LAB — SARS CORONAVIRUS 2 (TAT 6-24 HRS): SARS Coronavirus 2: NEGATIVE

## 2018-11-04 MED ORDER — METOPROLOL SUCCINATE ER 25 MG PO TB24: 12.5000 mg | ORAL_TABLET | Freq: Every day | ORAL | 0 refills | Status: AC

## 2018-11-04 MED ORDER — POLYETHYLENE GLYCOL 3350 17 G PO PACK: 17.0000 g | PACK | Freq: Every day | ORAL | 0 refills | Status: DC | PRN

## 2018-11-04 MED ORDER — CLONAZEPAM 0.5 MG PO TABS: 0.5000 mg | ORAL_TABLET | Freq: Every day | ORAL | 0 refills | Status: AC

## 2018-11-04 MED ORDER — HYDROCODONE-ACETAMINOPHEN 7.5-325 MG PO TABS: 1.0000 | ORAL_TABLET | Freq: Four times a day (QID) | ORAL | 0 refills | Status: DC | PRN

## 2018-11-04 NOTE — Discharge Summary (Signed)
[facility] - [place] at [facility]   PATIENT NAME: Maria Horton    MR#:  [id number]  DATE OF BIRTH:  [birth year]  DATE OF ADMISSION:  10/31/2018   ADMITTING PHYSICIAN: Oralia Manisavid Willis, MD  DATE OF DISCHARGE: 11/04/2018  PRIMARY CARE PHYSICIAN: Gracelyn NurseJohnston, John D, MD   ADMISSION DIAGNOSIS:  Dysequilibrium [R42] Fall, initial encounter [phone][[id number]] DISCHARGE DIAGNOSIS:  Principal Problem:   Ataxia Active Problems:   Hypertension   GERD (gastroesophageal reflux disease)   Sick sinus syndrome (HCC)  SECONDARY DIAGNOSIS:   Past Medical History:  Diagnosis Date   Anxiety    Chronic back pain    Constipation    Depression    Diverticulosis    Fibromyalgia    Gastroparesis    GERD (gastroesophageal reflux disease)    Hypertension    Hypoglycemia    Hypokalemia    Osteoarthritis    PONV (postoperative nausea and vomiting)    Reflux    Stroke (HCC)    TIA (transient ischemic attack)    Vascular dementia (HCC)    Vitamin D deficiency    HOSPITAL COURSE:  Chief complaint; fall  History of presenting complaint; Maria Horton  is a 78 y.o. female who presented with chief complaint as above.  Patient presents the ED with a complaint of multiple falls at home.  She stated that she has been having sensation of ataxia.  She denies any true vertigo, but does state that she feels off balance when she gets up.  She has been told she has some "fluid" in her right ear.  She has been working with physical therapy at home, but has not felt much improvement.  Initial evaluation here in the ED is largely within normal limits.  Hospitalist called for admission and further evaluation   Hospital course; 1.  Ataxia/recurrent falls-  Patient had a CT head which was negative for acute pathology.  Had an extensive neurologic work-up as an outpatient with Dr. Sherryll BurgerShah including MRI of the brain which was negative.  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 Seen by neurology and no plans for further imaging or intervention at this time. Continue PT and OT as tolerated.  Physical therapy initially suggesting continuous inpatient rehab but patient would not qualify for that and therefore placement in a skilled nursing facility was recommended.  [facility]  Patient clinically and hemodynamically stable.  I discussed with patient's daughter who is in agreement with plans for discharge to rehab today as well. Nursing staff later complained of patient having some rib pain.  Felt to be related to recurrent falls prior to admission.  Stat chest x-ray ordered to ensure no acute findings prior to discharge today. 2.  History of restless leg syndrome-continue Requip. 3.  Essential hypertension- continue triamterene/HCTZ, metoprolol. 4.  Hyperlipidemia-continue Pravachol. 5.  Anxiety/depression-continue Celexa, Wellbutrin, Klonopin. 6.  Urinary incontinence-continue Myrbetriq. 7. Inner ear fluid -on admission ER physician looked at patient's ears and thought she had some fluid in her ears bilaterally.  There is no evidence of acute otitis media or externa.  They recommended possible ENT eval. Dr. Cherlynn KaiserSainani sent a message to Dr. Willeen CassBennett via epic haiku chat who looked at the patient's images and chart and does not think that the patient needs acute ENT evaluation for this.  He recommended outpatient follow-up with ENT for vestibular testing  and possible consideration for nasal steroids if needed.  Patient shows no evidence of allergic rhinitis and therefore will hold off on treatment.  She can follow-up with ENT as outpatient.  I have requested to have the secretary make an appointment to follow-up with ENT physician Dr. Richardson Landry in 4 days.   DISCHARGE CONDITIONS:  Stable CONSULTS OBTAINED:  Treatment Team:  Neville Route,  MD DRUG ALLERGIES:   Allergies  Allergen Reactions   5-Alpha Reductase Inhibitors    Ace Inhibitors Other (See Comments)    achy   Beta Adrenergic Blockers Other (See Comments)    Makes achy/feel sick   Calcium Channel Blockers Other (See Comments)    Feels achy like sick   Latex Rash   Tape Rash   DISCHARGE MEDICATIONS:   Allergies as of 11/04/2018      Reactions   5-alpha Reductase Inhibitors    Ace Inhibitors Other (See Comments)   achy   Beta Adrenergic Blockers Other (See Comments)   Makes achy/feel sick   Calcium Channel Blockers Other (See Comments)   Feels achy like sick   Latex Rash   Tape Rash      Medication List    TAKE these medications   albuterol 1.25 MG/3ML nebulizer solution Commonly known as: ACCUNEB Take 3 mLs by nebulization every 6 (six) hours as needed for wheezing.   aspirin 81 MG EC tablet Take 1 tablet (81 mg total) by mouth daily.   b complex vitamins tablet Take 1 tablet daily by mouth.   benzonatate 100 MG capsule Commonly known as: TESSALON Take by mouth 2 (two) times daily.   Bran Fiber 500 MG Tabs Take 500 mg by mouth every other day.   buPROPion 300 MG 24 hr tablet Commonly known as: WELLBUTRIN XL Take 300 mg by mouth daily.   carbidopa-levodopa 50-200 MG tablet Commonly known as: SINEMET CR Take 1 tablet by mouth at bedtime.   carbidopa-levodopa 25-100 MG tablet Commonly known as: SINEMET IR Take 1 tablet by mouth 3 (three) times daily.   cholecalciferol 25 MCG (1000 UT) tablet Commonly known as: VITAMIN D3 Take 1,000 Units by mouth daily.   citalopram 40 MG tablet Commonly known as: CELEXA Take 40 mg daily by mouth.   clonazePAM 0.5 MG tablet Commonly known as: KLONOPIN Take 1 tablet (0.5 mg total) by mouth at bedtime.   clopidogrel 75 MG tablet Commonly known as: PLAVIX Take 75 mg by mouth daily.   esomeprazole 40 MG capsule Commonly known as: NEXIUM Take 40 mg by mouth at bedtime.     HYDROcodone-acetaminophen 7.5-325 MG tablet Commonly known as: NORCO Take 1 tablet by mouth every 6 (six) hours as needed for moderate pain or severe pain. What changed: reasons to take this   hydrOXYzine 50 MG tablet Commonly known as: ATARAX/VISTARIL Take 50-100 mg by mouth at bedtime as needed (sleep).   Melatonin 5 MG Chew Chew 10 mg by mouth at bedtime.   metoprolol succinate 25 MG 24 hr tablet Commonly known as: TOPROL-XL Take 0.5 tablets (12.5 mg total) by mouth daily. Start taking on: November 05, 2018   mirabegron ER 50 MG Tb24 tablet Commonly known as: MYRBETRIQ Take 1 tablet (50 mg total) by mouth daily. What changed: when to take this   polyethylene glycol 17 g packet Commonly known as: MIRALAX / GLYCOLAX Take 17 g by mouth daily as needed.   pravastatin 40 MG tablet Commonly known as: PRAVACHOL Take 1 tablet (40 mg total) by  mouth every evening.   rOPINIRole 0.5 MG tablet Commonly known as: REQUIP Take 1 mg by mouth at bedtime.   traZODone 100 MG tablet Commonly known as: DESYREL Take 200 mg by mouth at bedtime.   triamterene-hydrochlorothiazide 37.5-25 MG tablet Commonly known as: MAXZIDE-25 Take 0.5 tablets by mouth daily.   vitamin B-12 1000 MCG tablet Commonly known as: CYANOCOBALAMIN Take 1,000 mcg by mouth daily.        DISCHARGE INSTRUCTIONS:   DIET:  Cardiac diet DISCHARGE CONDITION:  Stable ACTIVITY:  Activity as tolerated OXYGEN:  Home Oxygen: No.  Oxygen Delivery: room air DISCHARGE LOCATION:  nursing home   If you experience worsening of your admission symptoms, develop shortness of breath, life threatening emergency, suicidal or homicidal thoughts you must seek medical attention immediately by calling 911 or calling your MD immediately  if symptoms less severe.  You Must read complete instructions/literature along with all the possible adverse reactions/side effects for all the Medicines you take and that have been  prescribed to you. Take any new Medicines after you have completely understood and accpet all the possible adverse reactions/side effects.   Please note  You were cared for by a hospitalist during your hospital stay. If you have any questions about your discharge medications or the care you received while you were in the hospital after you are discharged, you can call the unit and asked to speak with the hospitalist on call if the hospitalist that took care of you is not available. Once you are discharged, your primary care physician will handle any further medical issues. Please note that NO REFILLS for any discharge medications will be authorized once you are discharged, as it is imperative that you return to your primary care physician (or establish a relationship with a primary care physician if you do not have one) for your aftercare needs so that they can reassess your need for medications and monitor your lab values.    On the day of Discharge:  VITAL SIGNS:  Blood pressure (!) 103/49, pulse (!) 58, temperature 98.5 F (36.9 C), temperature source Oral, resp. rate 18, height 5\' 4"  (1.626 m), weight 65.7 kg, SpO2 98 %. PHYSICAL EXAMINATION:  GENERAL:  78 y.o.-year-old patient lying in the bed with no acute distress.  EYES: Pupils equal, round, reactive to light and accommodation. No scleral icterus. Extraocular muscles intact.  HEENT: Head atraumatic, normocephalic. Oropharynx and nasopharynx clear.  NECK:  Supple, no jugular venous distention. No thyroid enlargement, no tenderness.  LUNGS: Normal breath sounds bilaterally, no wheezing, rales,rhonchi or crepitation. No use of accessory muscles of respiration.  CARDIOVASCULAR: S1, S2 normal. No murmurs, rubs, or gallops.  ABDOMEN: Soft, non-tender, non-distended. Bowel sounds present. No organomegaly or mass.  EXTREMITIES: No pedal edema, cyanosis, or clubbing.  NEUROLOGIC:+ Intentional Tremor. Globally weak.  Gait not checked.   PSYCHIATRIC: The patient is alert and oriented x 3.  SKIN: No obvious rash, lesion, or ulcer.  DATA REVIEW:   CBC Recent Labs  Lab 11/01/18 0527  WBC 7.2  HGB 11.9*  HCT 36.4  PLT 159    Chemistries  Recent Labs  Lab 11/02/18 0627 11/03/18 0533  NA 142  --   K 3.3* 3.4*  CL 106  --   CO2 30  --   GLUCOSE 93  --   BUN 19  --   CREATININE 1.05*  --   CALCIUM 8.9  --   MG  --  1.8     Microbiology  Results  Results for orders placed or performed during the hospital encounter of 10/31/18  SARS Coronavirus 2 [facility] order, Performed in [facility] hospital lab) Nasopharyngeal Nasopharyngeal Swab     Status: None   Collection Time: 11/01/18 12:03 AM   Specimen: Nasopharyngeal Swab  Result Value Ref Range Status   SARS Coronavirus 2 NEGATIVE NEGATIVE Final    Comment: (NOTE) If result is NEGATIVE SARS-CoV-2 target nucleic acids are NOT DETECTED. The SARS-CoV-2 RNA is generally detectable in upper and lower  respiratory specimens during the acute phase of infection. The lowest  concentration of SARS-CoV-2 viral copies this assay can detect is 250  copies / mL. A negative result does not preclude SARS-CoV-2 infection  and should not be used as the sole basis for treatment or other  patient management decisions.  A negative result may occur with  improper specimen collection / handling, submission of specimen other  than nasopharyngeal swab, presence of viral mutation(s) within the  areas targeted by this assay, and inadequate number of viral copies  (<250 copies / mL). A negative result must be combined with clinical  observations, patient history, and epidemiological information. If result is POSITIVE SARS-CoV-2 target nucleic acids are DETECTED. The SARS-CoV-2 RNA is generally detectable in upper and lower  respiratory specimens dur ing the acute phase of infection.  Positive  results are indicative of active infection with SARS-CoV-2.  Clinical  correlation with  patient history and other diagnostic information is  necessary to determine patient infection status.  Positive results do  not rule out bacterial infection or co-infection with other viruses. If result is PRESUMPTIVE POSTIVE SARS-CoV-2 nucleic acids MAY BE PRESENT.   A presumptive positive result was obtained on the submitted specimen  and confirmed on repeat testing.  While 2019 novel coronavirus  (SARS-CoV-2) nucleic acids may be present in the submitted sample  additional confirmatory testing may be necessary for epidemiological  and / or clinical management purposes  to differentiate between  SARS-CoV-2 and other Sarbecovirus currently known to infect humans.  If clinically indicated additional testing with an alternate test  methodology [id number]) is advised. The SARS-CoV-2 RNA is generally  detectable in upper and lower respiratory sp ecimens during the acute  phase of infection. The expected result is Negative. Fact Sheet for Patients:  BoilerBrush.com.cy Fact Sheet for Healthcare Providers: https://pope.com/ This test is not yet approved or cleared by the [place] FDA and has been authorized for detection and/or diagnosis of SARS-CoV-2 by FDA under an Emergency Use Authorization (EUA).  This EUA will remain in effect (meaning this test can be used) for the duration of the COVID-19 declaration under Section 564(b)(1) of the Act, 21 U.S.C. section 360bbb-3(b)(1), unless the authorization is terminated or revoked sooner. Performed at [facility], [address] [place], [place] [address]   SARS CORONAVIRUS 2 (TAT 6-24 HRS) Nasopharyngeal Nasopharyngeal Swab     Status: None   Collection Time: 11/03/18  2:45 PM   Specimen: Nasopharyngeal Swab  Result Value Ref Range Status   SARS Coronavirus 2 NEGATIVE NEGATIVE Final    Comment: (NOTE) SARS-CoV-2 target nucleic acids are NOT DETECTED. The SARS-CoV-2 RNA is  generally detectable in upper and lower respiratory specimens during the acute phase of infection. Negative results do not preclude SARS-CoV-2 infection, do not rule out co-infections with other pathogens, and should not be used as the sole basis for treatment or other patient management decisions. Negative results must be combined with clinical observations, patient history,  and epidemiological information. The expected result is Negative. Fact Sheet for Patients: HairSlick.no Fact Sheet for Healthcare Providers: quierodirigir.com This test is not yet approved or cleared by the [place] FDA and  has been authorized for detection and/or diagnosis of SARS-CoV-2 by FDA under an Emergency Use Authorization (EUA). This EUA will remain  in effect (meaning this test can be used) for the duration of the COVID-19 declaration under Section 56 4(b)(1) of the Act, 21 U.S.C. section 360bbb-3(b)(1), unless the authorization is terminated or revoked sooner. Performed at [facility], [address], [place], [place] [address]     RADIOLOGY:  No results found.   Management plans discussed with the patient, family and they are in agreement.  CODE STATUS: Partial Code   TOTAL TIME TAKING CARE OF THIS PATIENT: 38 minutes.    Maria Horton M.D on 11/04/2018 at 11:22 AM  Between 7am to 6pm - Pager - [phone]  After 6pm go to [email or web address] - Social research officer, government  [facility]  Office  [phone]  CC: Primary care physician; Gracelyn Nurse, MD   Note: This dictation was prepared with Dragon dictation along with smaller phrase technology. Any transcriptional errors that result from this process are unintentional.

## 2018-11-04 NOTE — Progress Notes (Signed)
Pt c/o acute R lateral rib cage pain. Pain 8 ut of 10, intermittent shooting pain.  Pt reported that she had multiple falls at home.  Norco given. Dr Stark Jock made aware.  No new orders at this time.

## 2018-11-04 NOTE — TOC Progression Note (Signed)
Transition of Care [facility]) - Progression Note    Patient Details  Name: Maria Horton MRN: [id number] Date of Birth: 1940-03-27  [facility] CM/SW Contact  Marshell Garfinkel, RN Phone Number: 11/04/2018, 12:14 PM  Clinical Narrative:    Patient going to [facility] today by EMS per patient and daughter after 1PM due to bed status per [facility] with [facility]. Patient will go to room 810 report [phone].    Expected Discharge Plan: [place] Barriers to Discharge: No Barriers Identified  Expected Discharge Plan and Services Expected Discharge Plan: [place]   Discharge Planning Services: CM Consult Post Acute Care Choice: [place] Living arrangements for the past 2 months: Single Family Home Expected Discharge Date: 11/04/18                           [facility] Agency: [place] ([facility])         Social Determinants of Health (SDOH) Interventions    Readmission Risk Interventions No flowsheet data found.

## 2018-11-04 NOTE — Progress Notes (Signed)
Pt is being discharged to [facility].  Called report to [place], Therapist, sports.  A copy of AVS and Rx placed in discharge packet for receiving facility. Awaiting EMS.

## 2018-12-11 ENCOUNTER — Encounter: Payer: Self-pay | Admitting: Urology

## 2018-12-11 ENCOUNTER — Other Ambulatory Visit: Payer: Self-pay

## 2018-12-11 ENCOUNTER — Ambulatory Visit (INDEPENDENT_AMBULATORY_CARE_PROVIDER_SITE_OTHER): Payer: Medicare Other | Admitting: Urology

## 2018-12-11 VITALS — BP 96/67 | HR 62 | Ht 64.0 in | Wt 145.0 lb

## 2018-12-11 DIAGNOSIS — N3946 Mixed incontinence: Secondary | ICD-10-CM

## 2018-12-11 MED ORDER — SOLIFENACIN SUCCINATE 5 MG PO TABS: 5.0000 mg | ORAL_TABLET | Freq: Every day | ORAL | 11 refills | Status: DC

## 2018-12-11 MED ORDER — OXYBUTYNIN CHLORIDE ER 10 MG PO TB24: 10.0000 mg | ORAL_TABLET | Freq: Every day | ORAL | 11 refills | Status: DC

## 2018-12-11 NOTE — Progress Notes (Signed)
12/11/2018 1:29 PM   Maria Horton 24-Jun-1940 [id number]  Referring provider: Baxter Hire, MD [place],  [place] [address]  No chief complaint on file.   HPI: I was consulted to assess the patient is urinary continence worsening over many months. History was more difficult. She leaks with coughing sneezing bending lifting. The primary component is urge incontinence. She wears a pad at night and not certain if she has bedwetting. She wears a least 3 pads a day and she said the leakage is less severe  She voids every 1-2 hours and gets up once or twice at night. She has had a stroke.   Narrow introitus no prolapse no stress incontinence with a modest cough  Patient has mixed incontinence and she tends to be a little bit more frail. She has moderate frequency and milder nocturia. We will try to help her with medical therapy and/or percutaneous tibial nerve stimulation. Reassess in 6 weeks on Myrbetriq 50 mg samples and prescription. I will not order urodynamics at this stage. I will get a urine culture next time    I think the 4 weeks of samples helped a lot and she agreed but did not fill the prescription. We talked about switching medications but we both agreed to stay on this for a while  Today Patient frequency much better.  Much less urgency and pleased.  Unfortunately is $200.  Clinically not infected  I gave her Vesicare 5 mg and OxyIR 10 mg prescriptions and reevaluate in 8 weeks.  If she fails she will call insurance company for possible step therapy     PMH: Past Medical History:  Diagnosis Date  . Anxiety   . Chronic back pain   . Constipation   . Depression   . Diverticulosis   . Fibromyalgia   . Gastroparesis   . GERD (gastroesophageal reflux disease)   . Hypertension   . Hypoglycemia   . Hypokalemia   . Osteoarthritis   . PONV (postoperative nausea and vomiting)   . Reflux   . Stroke ([facility])   . TIA (transient ischemic  attack)   . Vascular dementia ([facility])   . Vitamin D deficiency     Surgical History: Past Surgical History:  Procedure Laterality Date  . ABDOMINAL HYSTERECTOMY    . ABDOMINOPLASTY    . BREAST BIOPSY Left 2005   bengin, with clip  . BUNIONECTOMY Bilateral   . CHOLECYSTECTOMY    . COLON SURGERY    . FLEXIBLE BRONCHOSCOPY Bilateral 01/11/2018   Procedure: FLEXIBLE BRONCHOSCOPY;  Surgeon: Ottie Glazier, MD;  Location: [facility];  Service: Thoracic;  Laterality: Bilateral;  . LEFT HEART CATH AND CORONARY ANGIOGRAPHY Left 10/26/2017   Procedure: LEFT HEART CATH AND CORONARY ANGIOGRAPHY;  Surgeon: Isaias Cowman, MD;  Location: [facility];  Service: Cardiovascular;  Laterality: Left;  . NECK SURGERY    . PACEMAKER INSERTION N/A 12/14/2016   Procedure: INSERTION PACEMAKER;  Surgeon: Isaias Cowman, MD;  Location: [facility];  Service: Cardiovascular;  Laterality: N/A;  . ROTATOR CUFF REPAIR    . TONSILLECTOMY      Home Medications:  Allergies as of 12/11/2018      Reactions   5-alpha Reductase Inhibitors    Ace Inhibitors Other (See Comments)   achy   Beta Adrenergic Blockers Other (See Comments)   Makes achy/feel sick   Calcium Channel Blockers Other (See Comments)   Feels achy like sick   Latex Rash  Tape Rash      Medication List       Accurate as of December 11, 2018  1:29 PM. If you have any questions, ask your nurse or doctor.        albuterol 1.25 MG/3ML nebulizer solution Commonly known as: ACCUNEB Take 3 mLs by nebulization every 6 (six) hours as needed for wheezing.   aspirin 81 MG EC tablet Take 1 tablet (81 mg total) by mouth daily.   b complex vitamins tablet Take 1 tablet daily by mouth.   benzonatate 100 MG capsule Commonly known as: TESSALON Take by mouth 2 (two) times daily.   Bran Fiber 500 MG Tabs Take 500 mg by mouth every other day.   buPROPion 300 MG 24 hr tablet Commonly known as: WELLBUTRIN XL Take 300 mg by mouth  daily.   carbidopa-levodopa 50-200 MG tablet Commonly known as: SINEMET CR Take 1 tablet by mouth at bedtime.   carbidopa-levodopa 25-100 MG tablet Commonly known as: SINEMET IR Take 1 tablet by mouth 3 (three) times daily.   cholecalciferol 25 MCG (1000 UT) tablet Commonly known as: VITAMIN D3 Take 1,000 Units by mouth daily.   citalopram 40 MG tablet Commonly known as: CELEXA Take 40 mg daily by mouth.   clonazePAM 0.5 MG tablet Commonly known as: KLONOPIN Take 1 tablet (0.5 mg total) by mouth at bedtime.   clopidogrel 75 MG tablet Commonly known as: PLAVIX Take 75 mg by mouth daily.   esomeprazole 40 MG capsule Commonly known as: NEXIUM Take 40 mg by mouth at bedtime.   HYDROcodone-acetaminophen 7.5-325 MG tablet Commonly known as: NORCO Take 1 tablet by mouth every 6 (six) hours as needed for moderate pain or severe pain.   hydrOXYzine 50 MG tablet Commonly known as: ATARAX/VISTARIL Take 50-100 mg by mouth at bedtime as needed (sleep).   Melatonin 5 MG Chew Chew 10 mg by mouth at bedtime.   metoprolol succinate 25 MG 24 hr tablet Commonly known as: TOPROL-XL Take 0.5 tablets (12.5 mg total) by mouth daily.   mirabegron ER 50 MG Tb24 tablet Commonly known as: MYRBETRIQ Take 1 tablet (50 mg total) by mouth daily. What changed: when to take this   polyethylene glycol 17 g packet Commonly known as: MIRALAX / GLYCOLAX Take 17 g by mouth daily as needed.   pravastatin 40 MG tablet Commonly known as: PRAVACHOL Take 1 tablet (40 mg total) by mouth every evening.   rOPINIRole 0.5 MG tablet Commonly known as: REQUIP Take 1 mg by mouth at bedtime.   traZODone 100 MG tablet Commonly known as: DESYREL Take 200 mg by mouth at bedtime.   triamterene-hydrochlorothiazide 37.5-25 MG tablet Commonly known as: MAXZIDE-25 Take 0.5 tablets by mouth daily.   vitamin B-12 1000 MCG tablet Commonly known as: CYANOCOBALAMIN Take 1,000 mcg by mouth daily.        Allergies:  Allergies  Allergen Reactions  . 5-Alpha Reductase Inhibitors   . Ace Inhibitors Other (See Comments)    achy  . Beta Adrenergic Blockers Other (See Comments)    Makes achy/feel sick  . Calcium Channel Blockers Other (See Comments)    Feels achy like sick  . Latex Rash  . Tape Rash    Family History: Family History  Problem Relation Age of Onset  . Hypertension Mother   . Breast cancer Sister 34    Social History:  reports that she has never smoked. She has never used smokeless tobacco. She reports that she does  not drink alcohol or use drugs.  ROS:                                        Physical Exam: There were no vitals taken for this visit.  Constitutional:  Alert and oriented, No acute distress.  Laboratory Data: Lab Results  Component Value Date   WBC 7.2 11/01/2018   HGB 11.9 (L) 11/01/2018   HCT 36.4 11/01/2018   MCV 92.4 11/01/2018   PLT 159 11/01/2018    Lab Results  Component Value Date   CREATININE 1.05 (H) 11/02/2018    No results found for: PSA  No results found for: TESTOSTERONE  Lab Results  Component Value Date   HGBA1C 5.4 11/15/2016    Urinalysis    Component Value Date/Time   COLORURINE AMBER (A) 10/31/2018 1950   APPEARANCEUR CLOUDY (A) 10/31/2018 1950   APPEARANCEUR Cloudy (A) 10/30/2018 1342   LABSPEC 1.026 10/31/2018 1950   PHURINE 5.0 10/31/2018 1950   GLUCOSEU NEGATIVE 10/31/2018 1950   HGBUR NEGATIVE 10/31/2018 1950   BILIRUBINUR NEGATIVE 10/31/2018 1950   BILIRUBINUR Negative 10/30/2018 1342   KETONESUR 5 (A) 10/31/2018 1950   PROTEINUR NEGATIVE 10/31/2018 1950   NITRITE NEGATIVE 10/31/2018 1950   LEUKOCYTESUR SMALL (A) 10/31/2018 1950    Pertinent Imaging:   Assessment & Plan: Reassess 8 weeks  There are no diagnoses linked to this encounter.  No follow-ups on file.  Reece Packer, MD  [facility] [address] [place],   [address] [phone]

## 2019-02-05 ENCOUNTER — Ambulatory Visit (INDEPENDENT_AMBULATORY_CARE_PROVIDER_SITE_OTHER): Payer: Medicare Other | Admitting: Urology

## 2019-02-05 ENCOUNTER — Other Ambulatory Visit: Payer: Self-pay

## 2019-02-05 ENCOUNTER — Encounter: Payer: Self-pay | Admitting: Urology

## 2019-02-05 VITALS — BP 106/68 | HR 66 | Ht 65.0 in | Wt 141.0 lb

## 2019-02-05 DIAGNOSIS — N3946 Mixed incontinence: Secondary | ICD-10-CM

## 2019-02-05 NOTE — Progress Notes (Signed)
02/05/2019 2:17 PM   Maria Horton 27-Jul-1940 [id number]  Referring provider: Baxter Hire, MD [place],  [place] [address]  Chief Complaint  Patient presents with  . Follow-up    HPI: I was consulted to assess the patient is urinary continence worsening over many months. History was more difficult. She leaks with coughing sneezing bending lifting. The primary component is urge incontinence. She wears a pad at night and not certain if she has bedwetting. She wears a least 3 pads a day and she said the leakage is less severe  She voids every 1-2 hours and gets up once or twice at night. She has had a stroke.  Narrow introitus no prolapse no stress incontinence with a modest cough  Patient has mixed incontinence and she tends to be a little bit more frail. She has moderate frequency and milder nocturia. We will try to help her with medical therapy and/or percutaneous tibial nerve stimulation. Reassess in 6 weeks on Myrbetriq 50 mg samples and prescription. I will not order urodynamics at this stage. I will get a urine culture next time  I think the 4 weeks of samples helped a lot and she agreed but did not fill the prescription. Unfortunately is $200.    I gave her Vesicare 5 mg and OxyIR 10 mg prescriptions and reevaluate in 8 weeks.  If she fails she will call insurance company for possible step therapy  Today Frequency is stable Patient only tried oxybutynin failed.  She wants to try the solifenacin.  I did not bring up percutaneous tibial nerve stimulation.  She will call about step therapy if she fails.  Reevaluate 6 weeks   PMH: Past Medical History:  Diagnosis Date  . Anxiety   . Chronic back pain   . Constipation   . Depression   . Diverticulosis   . Fibromyalgia   . Gastroparesis   . GERD (gastroesophageal reflux disease)   . Hypertension   . Hypoglycemia   . Hypokalemia   . Osteoarthritis   . PONV (postoperative  nausea and vomiting)   . Reflux   . Stroke ([place])   . TIA (transient ischemic attack)   . Vascular dementia ([facility])   . Vitamin D deficiency     Surgical History: Past Surgical History:  Procedure Laterality Date  . ABDOMINAL HYSTERECTOMY    . ABDOMINOPLASTY    . BREAST BIOPSY Left 2005   bengin, with clip  . BUNIONECTOMY Bilateral   . CHOLECYSTECTOMY    . COLON SURGERY    . FLEXIBLE BRONCHOSCOPY Bilateral 01/11/2018   Procedure: FLEXIBLE BRONCHOSCOPY;  Surgeon: Ottie Glazier, MD;  Location: [facility];  Service: Thoracic;  Laterality: Bilateral;  . LEFT HEART CATH AND CORONARY ANGIOGRAPHY Left 10/26/2017   Procedure: LEFT HEART CATH AND CORONARY ANGIOGRAPHY;  Surgeon: Isaias Cowman, MD;  Location: [facility];  Service: Cardiovascular;  Laterality: Left;  . NECK SURGERY    . PACEMAKER INSERTION N/A 12/14/2016   Procedure: INSERTION PACEMAKER;  Surgeon: Isaias Cowman, MD;  Location: [facility];  Service: Cardiovascular;  Laterality: N/A;  . ROTATOR CUFF REPAIR    . TONSILLECTOMY      Home Medications:  Allergies as of 02/05/2019      Reactions   5-alpha Reductase Inhibitors    Ace Inhibitors Other (See Comments)   achy   Beta Adrenergic Blockers Other (See Comments)   Makes achy/feel sick   Calcium Channel Blockers Other (See Comments)  Feels achy like sick   Latex Rash   Tape Rash      Medication List       Accurate as of February 05, 2019  2:17 PM. If you have any questions, ask your nurse or doctor.        albuterol 1.25 MG/3ML nebulizer solution Commonly known as: ACCUNEB Take 3 mLs by nebulization every 6 (six) hours as needed for wheezing.   aspirin 81 MG EC tablet Take 1 tablet (81 mg total) by mouth daily.   b complex vitamins tablet Take 1 tablet daily by mouth.   benzonatate 100 MG capsule Commonly known as: TESSALON Take by mouth 2 (two) times daily.   Bran Fiber 500 MG Tabs Take 500 mg by mouth every other day.   buPROPion  300 MG 24 hr tablet Commonly known as: WELLBUTRIN XL Take 300 mg by mouth daily.   carbidopa-levodopa 50-200 MG tablet Commonly known as: SINEMET CR Take 1 tablet by mouth at bedtime.   carbidopa-levodopa 25-100 MG tablet Commonly known as: SINEMET IR Take 1 tablet by mouth 3 (three) times daily.   cholecalciferol 25 MCG (1000 UNIT) tablet Commonly known as: VITAMIN D3 Take 1,000 Units by mouth daily.   citalopram 40 MG tablet Commonly known as: CELEXA Take 40 mg daily by mouth.   clonazePAM 0.5 MG tablet Commonly known as: KLONOPIN Take 1 tablet (0.5 mg total) by mouth at bedtime.   clopidogrel 75 MG tablet Commonly known as: PLAVIX Take 75 mg by mouth daily.   esomeprazole 40 MG capsule Commonly known as: NEXIUM Take 40 mg by mouth at bedtime.   HYDROcodone-acetaminophen 7.5-325 MG tablet Commonly known as: NORCO Take 1 tablet by mouth every 6 (six) hours as needed for moderate pain or severe pain.   hydrOXYzine 50 MG tablet Commonly known as: ATARAX/VISTARIL Take 50-100 mg by mouth at bedtime as needed (sleep).   Melatonin 5 MG Chew Chew 10 mg by mouth at bedtime.   metoprolol succinate 25 MG 24 hr tablet Commonly known as: TOPROL-XL Take 0.5 tablets (12.5 mg total) by mouth daily.   mirabegron ER 50 MG Tb24 tablet Commonly known as: MYRBETRIQ Take 1 tablet (50 mg total) by mouth daily. What changed: when to take this   oxybutynin 10 MG 24 hr tablet Commonly known as: DITROPAN-XL Take 1 tablet (10 mg total) by mouth daily.   polyethylene glycol 17 g packet Commonly known as: MIRALAX / GLYCOLAX Take 17 g by mouth daily as needed.   pravastatin 40 MG tablet Commonly known as: PRAVACHOL Take 1 tablet (40 mg total) by mouth every evening.   rOPINIRole 0.5 MG tablet Commonly known as: REQUIP Take 1 mg by mouth at bedtime.   solifenacin 5 MG tablet Commonly known as: VESICARE Take 1 tablet (5 mg total) by mouth daily.   traZODone 100 MG  tablet Commonly known as: DESYREL Take 200 mg by mouth at bedtime.   triamterene-hydrochlorothiazide 37.5-25 MG tablet Commonly known as: MAXZIDE-25 Take 0.5 tablets by mouth daily.   vitamin B-12 1000 MCG tablet Commonly known as: CYANOCOBALAMIN Take 1,000 mcg by mouth daily.       Allergies:  Allergies  Allergen Reactions  . 5-Alpha Reductase Inhibitors   . Ace Inhibitors Other (See Comments)    achy  . Beta Adrenergic Blockers Other (See Comments)    Makes achy/feel sick  . Calcium Channel Blockers Other (See Comments)    Feels achy like sick  . Latex Rash  .  Tape Rash    Family History: Family History  Problem Relation Age of Onset  . Hypertension Mother   . Breast cancer Sister 67    Social History:  reports that she has never smoked. She has never used smokeless tobacco. She reports that she does not drink alcohol or use drugs.  ROS: UROLOGY Frequent Urination?: No Hard to postpone urination?: No Burning/pain with urination?: Yes Get up at night to urinate?: Yes Leakage of urine?: No Urine stream starts and stops?: No Trouble starting stream?: No Do you have to strain to urinate?: No Blood in urine?: No Urinary tract infection?: No Sexually transmitted disease?: No Injury to kidneys or bladder?: No Painful intercourse?: No Weak stream?: No Currently pregnant?: No Vaginal bleeding?: No Last menstrual period?: n  Gastrointestinal Nausea?: Yes Vomiting?: Yes Indigestion/heartburn?: Yes Diarrhea?: No Constipation?: No  Constitutional Fever: No Night sweats?: No Weight loss?: No Fatigue?: No  Skin Skin rash/lesions?: No Itching?: No  Eyes Blurred vision?: No Double vision?: No  Ears/Nose/Throat Sore throat?: No Sinus problems?: No  Hematologic/Lymphatic Swollen glands?: No Easy bruising?: Yes  Cardiovascular Leg swelling?: No Chest pain?: No  Respiratory Cough?: Yes Shortness of breath?: No  Endocrine Excessive thirst?:  Yes  Musculoskeletal Back pain?: No Joint pain?: No        Physical Exam: BP 106/68   Pulse 66   Ht _0  (1.651 m)   Wt 141 lb (64 kg)   BMI 23.46 kg/m   Constitutional:  Alert and oriented, No acute distress.  Laboratory Data: Lab Results  Component Value Date   WBC 7.2 11/01/2018   HGB 11.9 (L) 11/01/2018   HCT 36.4 11/01/2018   MCV 92.4 11/01/2018   PLT 159 11/01/2018    Lab Results  Component Value Date   CREATININE 1.05 (H) 11/02/2018    No results found for: PSA  No results found for: TESTOSTERONE  Lab Results  Component Value Date   HGBA1C 5.4 11/15/2016    Urinalysis    Component Value Date/Time   COLORURINE AMBER (A) 10/31/2018 1950   APPEARANCEUR CLOUDY (A) 10/31/2018 1950   APPEARANCEUR Cloudy (A) 10/30/2018 1342   LABSPEC 1.026 10/31/2018 1950   PHURINE 5.0 10/31/2018 1950   GLUCOSEU NEGATIVE 10/31/2018 1950   HGBUR NEGATIVE 10/31/2018 [place] NEGATIVE 10/31/2018 1950   BILIRUBINUR Negative 10/30/2018 1342   KETONESUR 5 (A) 10/31/2018 1950   PROTEINUR NEGATIVE 10/31/2018 1950   NITRITE NEGATIVE 10/31/2018 1950   LEUKOCYTESUR SMALL (A) 10/31/2018 1950    Pertinent Imaging:   Assessment & Plan: See in 6 weeks  There are no diagnoses linked to this encounter.  No follow-ups on file.  Reece Packer, MD  [facility] [address], [place], [place] [address] [phone]

## 2019-03-19 ENCOUNTER — Encounter: Payer: Self-pay | Admitting: Urology

## 2019-03-19 ENCOUNTER — Ambulatory Visit: Payer: Medicare Other | Admitting: Urology

## 2019-04-02 ENCOUNTER — Ambulatory Visit (INDEPENDENT_AMBULATORY_CARE_PROVIDER_SITE_OTHER): Payer: Medicare Other | Admitting: Urology

## 2019-04-02 ENCOUNTER — Other Ambulatory Visit: Payer: Self-pay

## 2019-04-02 VITALS — BP 100/60 | HR 70 | Ht 65.0 in | Wt 141.0 lb

## 2019-04-02 DIAGNOSIS — N3946 Mixed incontinence: Secondary | ICD-10-CM | POA: Diagnosis not present

## 2019-04-02 NOTE — Progress Notes (Signed)
04/02/2019 11:31 AM   Melven Sartorius 29-May-1940 [id number]  Referring provider: Baxter Hire, MD [place],  [place] [address]  No chief complaint on file.   HPI: I was consulted to assess the patient is urinary continence worsening over many months. History was more difficult. She leaks with coughing sneezing bending lifting. The primary component is urge incontinence. She wears a pad at night and not certain if she has bedwetting. She wears a least 3 pads a day and she said the leakage is less severe  She voids every 1-2 hours and gets up once or twice at night. She has had a stroke.  Narrow introitus no prolapse no stress incontinence with a modest cough  Patient has mixed incontinence and she tends to be a little bit more frail. She has moderate frequency and milder nocturia. We will try to help her with medical therapy and/or percutaneous tibial nerve stimulation. Reassess in 6 weeks on Myrbetriq 50 mg samples and prescription. I will not order urodynamics at this stage.   I think the 4 weeks of samples helped a lot and she agreed but did not fill the prescription.Unfortunately is $200.   I gave her Vesicare 5 mg and OxyIR 10 mg prescriptions and reevaluate in 8 weeks. If she fails she will call insurance company for possible step therapy  Patient only tried oxybutynin failed.  She wants to try the solifenacin.  I did not bring up percutaneous tibial nerve stimulation.  She will call about step therapy if she fails.    Today Frequency stable.  Urge incontinence stable.  Clinically not infected.  Patient did not respond to solifenacin.  They filled the prescription for about $200 day 5 with no benefit yet.  They will take it for a month.  If it works they will call [employer] about step therapy.  I talked about percutaneous tibial nerve stimulation with full template and handout.  As a combination treatment or as a monotherapy  they will call if they would like to do percutaneous tibial nerve stimulation.  Otherwise if she is on Myrbetriq her daughter will have her see me in 1 year but otherwise as needed     PMH: Past Medical History:  Diagnosis Date  . Anxiety   . Chronic back pain   . Constipation   . Depression   . Diverticulosis   . Fibromyalgia   . Gastroparesis   . GERD (gastroesophageal reflux disease)   . Hypertension   . Hypoglycemia   . Hypokalemia   . Osteoarthritis   . PONV (postoperative nausea and vomiting)   . Reflux   . Stroke ([place])   . TIA (transient ischemic attack)   . Vascular dementia ([place])   . Vitamin D deficiency     Surgical History: Past Surgical History:  Procedure Laterality Date  . ABDOMINAL HYSTERECTOMY    . ABDOMINOPLASTY    . BREAST BIOPSY Left 2005   bengin, with clip  . BUNIONECTOMY Bilateral   . CHOLECYSTECTOMY    . COLON SURGERY    . FLEXIBLE BRONCHOSCOPY Bilateral 01/11/2018   Procedure: FLEXIBLE BRONCHOSCOPY;  Surgeon: Ottie Glazier, MD;  Location: [facility];  Service: Thoracic;  Laterality: Bilateral;  . LEFT HEART CATH AND CORONARY ANGIOGRAPHY Left 10/26/2017   Procedure: LEFT HEART CATH AND CORONARY ANGIOGRAPHY;  Surgeon: Isaias Cowman, MD;  Location: [facility];  Service: Cardiovascular;  Laterality: Left;  . NECK SURGERY    .  PACEMAKER INSERTION N/A 12/14/2016   Procedure: INSERTION PACEMAKER;  Surgeon: Isaias Cowman, MD;  Location: [facility];  Service: Cardiovascular;  Laterality: N/A;  . ROTATOR CUFF REPAIR    . TONSILLECTOMY      Home Medications:  Allergies as of 04/02/2019      Reactions   5-alpha Reductase Inhibitors    Ace Inhibitors Other (See Comments)   achy   Beta Adrenergic Blockers Other (See Comments)   Makes achy/feel sick   Calcium Channel Blockers Other (See Comments)   Feels achy like sick   Latex Rash   Tape Rash      Medication List       Accurate as of April 02, 2019 11:31 AM. If you have  any questions, ask your nurse or doctor.        albuterol 1.25 MG/3ML nebulizer solution Commonly known as: ACCUNEB Take 3 mLs by nebulization every 6 (six) hours as needed for wheezing.   aspirin 81 MG EC tablet Take 1 tablet (81 mg total) by mouth daily.   b complex vitamins tablet Take 1 tablet daily by mouth.   benzonatate 100 MG capsule Commonly known as: TESSALON Take by mouth 2 (two) times daily.   Bran Fiber 500 MG Tabs Take 500 mg by mouth every other day.   buPROPion 300 MG 24 hr tablet Commonly known as: WELLBUTRIN XL Take 300 mg by mouth daily.   carbidopa-levodopa 50-200 MG tablet Commonly known as: SINEMET CR Take 1 tablet by mouth at bedtime.   carbidopa-levodopa 25-100 MG tablet Commonly known as: SINEMET IR Take 1 tablet by mouth 3 (three) times daily.   cholecalciferol 25 MCG (1000 UNIT) tablet Commonly known as: VITAMIN D3 Take 1,000 Units by mouth daily.   citalopram 40 MG tablet Commonly known as: CELEXA Take 40 mg daily by mouth.   clonazePAM 0.5 MG tablet Commonly known as: KLONOPIN Take 1 tablet (0.5 mg total) by mouth at bedtime.   clopidogrel 75 MG tablet Commonly known as: PLAVIX Take 75 mg by mouth daily.   esomeprazole 40 MG capsule Commonly known as: NEXIUM Take 40 mg by mouth at bedtime.   HYDROcodone-acetaminophen 7.5-325 MG tablet Commonly known as: NORCO Take 1 tablet by mouth every 6 (six) hours as needed for moderate pain or severe pain.   hydrOXYzine 50 MG tablet Commonly known as: ATARAX/VISTARIL Take 50-100 mg by mouth at bedtime as needed (sleep).   Melatonin 5 MG Chew Chew 10 mg by mouth at bedtime.   metoprolol succinate 25 MG 24 hr tablet Commonly known as: TOPROL-XL Take 0.5 tablets (12.5 mg total) by mouth daily.   mirabegron ER 50 MG Tb24 tablet Commonly known as: MYRBETRIQ Take 1 tablet (50 mg total) by mouth daily. What changed: when to take this   oxybutynin 10 MG 24 hr tablet Commonly known as:  DITROPAN-XL Take 1 tablet (10 mg total) by mouth daily.   polyethylene glycol 17 g packet Commonly known as: MIRALAX / GLYCOLAX Take 17 g by mouth daily as needed.   pravastatin 40 MG tablet Commonly known as: PRAVACHOL Take 1 tablet (40 mg total) by mouth every evening.   rOPINIRole 0.5 MG tablet Commonly known as: REQUIP Take 1 mg by mouth at bedtime.   solifenacin 5 MG tablet Commonly known as: VESICARE Take 1 tablet (5 mg total) by mouth daily.   traZODone 100 MG tablet Commonly known as: DESYREL Take 200 mg by mouth at bedtime.   triamterene-hydrochlorothiazide 37.5-25 MG tablet  Commonly known as: MAXZIDE-25 Take 0.5 tablets by mouth daily.   vitamin B-12 1000 MCG tablet Commonly known as: CYANOCOBALAMIN Take 1,000 mcg by mouth daily.       Allergies:  Allergies  Allergen Reactions  . 5-Alpha Reductase Inhibitors   . Ace Inhibitors Other (See Comments)    achy  . Beta Adrenergic Blockers Other (See Comments)    Makes achy/feel sick  . Calcium Channel Blockers Other (See Comments)    Feels achy like sick  . Latex Rash  . Tape Rash    Family History: Family History  Problem Relation Age of Onset  . Hypertension Mother   . Breast cancer Sister 43    Social History:  reports that she has never smoked. She has never used smokeless tobacco. She reports that she does not drink alcohol or use drugs.  ROS:                                        Physical Exam: There were no vitals taken for this visit.   Laboratory Data: Lab Results  Component Value Date   WBC 7.2 11/01/2018   HGB 11.9 (L) 11/01/2018   HCT 36.4 11/01/2018   MCV 92.4 11/01/2018   PLT 159 11/01/2018    Lab Results  Component Value Date   CREATININE 1.05 (H) 11/02/2018    No results found for: PSA  No results found for: TESTOSTERONE  Lab Results  Component Value Date   HGBA1C 5.4 11/15/2016    Urinalysis    Component Value Date/Time   COLORURINE  AMBER (A) 10/31/2018 1950   APPEARANCEUR CLOUDY (A) 10/31/2018 1950   APPEARANCEUR Cloudy (A) 10/30/2018 1342   LABSPEC 1.026 10/31/2018 1950   PHURINE 5.0 10/31/2018 1950   GLUCOSEU NEGATIVE 10/31/2018 1950   HGBUR NEGATIVE 10/31/2018 [place] NEGATIVE 10/31/2018 1950   BILIRUBINUR Negative 10/30/2018 1342   KETONESUR 5 (A) 10/31/2018 1950   PROTEINUR NEGATIVE 10/31/2018 1950   NITRITE NEGATIVE 10/31/2018 1950   LEUKOCYTESUR SMALL (A) 10/31/2018 1950    Pertinent Imaging:   Assessment & Plan: As noted above  There are no diagnoses linked to this encounter.  No follow-ups on file.  Reece Packer, MD  [facility] [address] [place], [place] [address] [phone]

## 2019-05-11 ENCOUNTER — Emergency Department: Payer: Medicare Other

## 2019-05-11 ENCOUNTER — Other Ambulatory Visit: Payer: Self-pay

## 2019-05-11 ENCOUNTER — Encounter: Payer: Self-pay | Admitting: Emergency Medicine

## 2019-05-11 ENCOUNTER — Inpatient Hospital Stay
Admission: EM | Admit: 2019-05-11 | Discharge: 2019-05-16 | DRG: 092 | Disposition: A | Discharge status: Hospice | Payer: Medicare Other | Attending: Internal Medicine | Admitting: Internal Medicine

## 2019-05-11 DIAGNOSIS — Z515 Encounter for palliative care: Secondary | ICD-10-CM | POA: Diagnosis not present

## 2019-05-11 DIAGNOSIS — I10 Essential (primary) hypertension: Secondary | ICD-10-CM | POA: Diagnosis present

## 2019-05-11 DIAGNOSIS — G20C Parkinsonism, unspecified: Secondary | ICD-10-CM

## 2019-05-11 DIAGNOSIS — Z8249 Family history of ischemic heart disease and other diseases of the circulatory system: Secondary | ICD-10-CM

## 2019-05-11 DIAGNOSIS — F0150 Vascular dementia without behavioral disturbance: Secondary | ICD-10-CM | POA: Diagnosis present

## 2019-05-11 DIAGNOSIS — F32A Depression, unspecified: Secondary | ICD-10-CM

## 2019-05-11 DIAGNOSIS — G4700 Insomnia, unspecified: Secondary | ICD-10-CM | POA: Diagnosis present

## 2019-05-11 DIAGNOSIS — E162 Hypoglycemia, unspecified: Secondary | ICD-10-CM

## 2019-05-11 DIAGNOSIS — Y92009 Unspecified place in unspecified non-institutional (private) residence as the place of occurrence of the external cause: Secondary | ICD-10-CM

## 2019-05-11 DIAGNOSIS — S93401A Sprain of unspecified ligament of right ankle, initial encounter: Secondary | ICD-10-CM

## 2019-05-11 DIAGNOSIS — R270 Ataxia, unspecified: Secondary | ICD-10-CM | POA: Diagnosis present

## 2019-05-11 DIAGNOSIS — Z7401 Bed confinement status: Secondary | ICD-10-CM

## 2019-05-11 DIAGNOSIS — R296 Repeated falls: Secondary | ICD-10-CM | POA: Diagnosis present

## 2019-05-11 DIAGNOSIS — G2401 Drug induced subacute dyskinesia: Principal | ICD-10-CM | POA: Diagnosis present

## 2019-05-11 DIAGNOSIS — W19XXXA Unspecified fall, initial encounter: Secondary | ICD-10-CM | POA: Diagnosis present

## 2019-05-11 DIAGNOSIS — G2581 Restless legs syndrome: Secondary | ICD-10-CM | POA: Diagnosis present

## 2019-05-11 DIAGNOSIS — Z79899 Other long term (current) drug therapy: Secondary | ICD-10-CM

## 2019-05-11 DIAGNOSIS — S63501A Unspecified sprain of right wrist, initial encounter: Secondary | ICD-10-CM | POA: Diagnosis present

## 2019-05-11 DIAGNOSIS — Z6823 Body mass index (BMI) 23.0-23.9, adult: Secondary | ICD-10-CM

## 2019-05-11 DIAGNOSIS — Z66 Do not resuscitate: Secondary | ICD-10-CM | POA: Diagnosis present

## 2019-05-11 DIAGNOSIS — Z7982 Long term (current) use of aspirin: Secondary | ICD-10-CM

## 2019-05-11 DIAGNOSIS — M797 Fibromyalgia: Secondary | ICD-10-CM | POA: Diagnosis present

## 2019-05-11 DIAGNOSIS — F329 Major depressive disorder, single episode, unspecified: Secondary | ICD-10-CM

## 2019-05-11 DIAGNOSIS — R633 Feeding difficulties: Secondary | ICD-10-CM | POA: Diagnosis present

## 2019-05-11 DIAGNOSIS — T50905A Adverse effect of unspecified drugs, medicaments and biological substances, initial encounter: Secondary | ICD-10-CM

## 2019-05-11 DIAGNOSIS — K219 Gastro-esophageal reflux disease without esophagitis: Secondary | ICD-10-CM | POA: Diagnosis present

## 2019-05-11 DIAGNOSIS — N183 Chronic kidney disease, stage 3 unspecified: Secondary | ICD-10-CM | POA: Diagnosis present

## 2019-05-11 DIAGNOSIS — T796XXA Traumatic ischemia of muscle, initial encounter: Secondary | ICD-10-CM | POA: Diagnosis present

## 2019-05-11 DIAGNOSIS — G894 Chronic pain syndrome: Secondary | ICD-10-CM | POA: Diagnosis present

## 2019-05-11 DIAGNOSIS — Z888 Allergy status to other drugs, medicaments and biological substances status: Secondary | ICD-10-CM

## 2019-05-11 DIAGNOSIS — Z9049 Acquired absence of other specified parts of digestive tract: Secondary | ICD-10-CM

## 2019-05-11 DIAGNOSIS — Z8673 Personal history of transient ischemic attack (TIA), and cerebral infarction without residual deficits: Secondary | ICD-10-CM

## 2019-05-11 DIAGNOSIS — I129 Hypertensive chronic kidney disease with stage 1 through stage 4 chronic kidney disease, or unspecified chronic kidney disease: Secondary | ICD-10-CM | POA: Diagnosis present

## 2019-05-11 DIAGNOSIS — K3184 Gastroparesis: Secondary | ICD-10-CM | POA: Diagnosis present

## 2019-05-11 DIAGNOSIS — F419 Anxiety disorder, unspecified: Secondary | ICD-10-CM | POA: Diagnosis present

## 2019-05-11 DIAGNOSIS — F3130 Bipolar disorder, current episode depressed, mild or moderate severity, unspecified: Secondary | ICD-10-CM | POA: Diagnosis present

## 2019-05-11 DIAGNOSIS — Z91048 Other nonmedicinal substance allergy status: Secondary | ICD-10-CM

## 2019-05-11 DIAGNOSIS — R630 Anorexia: Secondary | ICD-10-CM | POA: Diagnosis present

## 2019-05-11 DIAGNOSIS — E559 Vitamin D deficiency, unspecified: Secondary | ICD-10-CM | POA: Diagnosis present

## 2019-05-11 DIAGNOSIS — Z20822 Contact with and (suspected) exposure to covid-19: Secondary | ICD-10-CM | POA: Diagnosis present

## 2019-05-11 DIAGNOSIS — Z950 Presence of cardiac pacemaker: Secondary | ICD-10-CM

## 2019-05-11 DIAGNOSIS — Z7189 Other specified counseling: Secondary | ICD-10-CM | POA: Diagnosis not present

## 2019-05-11 DIAGNOSIS — Z91040 Latex allergy status: Secondary | ICD-10-CM

## 2019-05-11 DIAGNOSIS — G20 Parkinson's disease: Secondary | ICD-10-CM | POA: Diagnosis not present

## 2019-05-11 DIAGNOSIS — Z7902 Long term (current) use of antithrombotics/antiplatelets: Secondary | ICD-10-CM

## 2019-05-11 DIAGNOSIS — R443 Hallucinations, unspecified: Secondary | ICD-10-CM | POA: Diagnosis present

## 2019-05-11 LAB — TROPONIN I (HIGH SENSITIVITY)
Troponin I (High Sensitivity): 5 ng/L (ref <18)
Troponin I (High Sensitivity): 6 ng/L (ref <18)

## 2019-05-11 LAB — CBC WITH DIFFERENTIAL/PLATELET
Abs Immature Granulocytes: 0.04 10*3/uL (ref 0.00–0.07)
Basophils Absolute: 0 10*3/uL (ref 0.0–0.1)
Basophils Relative: 0 %
Eosinophils Absolute: 0 10*3/uL (ref 0.0–0.5)
Eosinophils Relative: 0 %
HCT: 36.2 % (ref 36.0–46.0)
Hemoglobin: 12.4 g/dL (ref 12.0–15.0)
Immature Granulocytes: 0 %
Lymphocytes Relative: 22 %
Lymphs Abs: 2.1 10*3/uL (ref 0.7–4.0)
MCH: 31.2 pg (ref 26.0–34.0)
MCHC: 34.3 g/dL (ref 30.0–36.0)
MCV: 91.2 fL (ref 80.0–100.0)
Monocytes Absolute: 0.8 10*3/uL (ref 0.1–1.0)
Monocytes Relative: 9 %
Neutro Abs: 6.3 10*3/uL (ref 1.7–7.7)
Neutrophils Relative %: 69 %
Platelets: 210 10*3/uL (ref 150–400)
RBC: 3.97 MIL/uL (ref 3.87–5.11)
RDW: 14.7 % (ref 11.5–15.5)
WBC: 9.3 10*3/uL (ref 4.0–10.5)
nRBC: 0 % (ref 0.0–0.2)

## 2019-05-11 LAB — COMPREHENSIVE METABOLIC PANEL
ALT: 6 U/L (ref 0–44)
AST: 24 U/L (ref 15–41)
Albumin: 3.8 g/dL (ref 3.5–5.0)
Alkaline Phosphatase: 42 U/L (ref 38–126)
Anion gap: 12 (ref 5–15)
BUN: 18 mg/dL (ref 8–23)
CO2: 29 mmol/L (ref 22–32)
Calcium: 9.1 mg/dL (ref 8.9–10.3)
Chloride: 100 mmol/L (ref 98–111)
Creatinine, Ser: 1.2 mg/dL — ABNORMAL HIGH (ref 0.44–1.00)
GFR calc Af Amer: 51 mL/min — ABNORMAL LOW (ref >60)
GFR calc non Af Amer: 44 mL/min — ABNORMAL LOW (ref >60)
Glucose, Bld: 67 mg/dL — ABNORMAL LOW (ref 70–99)
Potassium: 3.5 mmol/L (ref 3.5–5.1)
Sodium: 141 mmol/L (ref 135–145)
Total Bilirubin: 1.3 mg/dL — ABNORMAL HIGH (ref 0.3–1.2)
Total Protein: 6.3 g/dL — ABNORMAL LOW (ref 6.5–8.1)

## 2019-05-11 LAB — TSH: TSH: 3.362 u[IU]/mL (ref 0.350–4.500)

## 2019-05-11 MED ORDER — LIDOCAINE 5 % EX PTCH: 1.0000 | MEDICATED_PATCH | Freq: Two times a day (BID) | CUTANEOUS | 0 refills | Status: AC

## 2019-05-11 MED ORDER — ASPIRIN EC 81 MG PO TBEC
81.0000 mg | DELAYED_RELEASE_TABLET | Freq: Every day | ORAL | Status: DC
  Administered 2019-05-11 – 2019-05-16 (×6): 81 mg via ORAL
  Filled 2019-05-11 (×6): qty 1

## 2019-05-11 MED ORDER — CARBIDOPA-LEVODOPA ER 50-200 MG PO TBCR
1.0000 | EXTENDED_RELEASE_TABLET | Freq: Every day | ORAL | Status: DC
  Administered 2019-05-11 – 2019-05-15 (×5): 1 via ORAL
  Filled 2019-05-11 (×7): qty 1

## 2019-05-11 MED ORDER — DIPHENHYDRAMINE HCL 25 MG PO CAPS
25.0000 mg | ORAL_CAPSULE | Freq: Once | ORAL | Status: AC
  Administered 2019-05-11: 25 mg via ORAL
  Filled 2019-05-11: qty 1

## 2019-05-11 MED ORDER — ONDANSETRON HCL 4 MG PO TABS: 4.0000 mg | ORAL_TABLET | Freq: Four times a day (QID) | ORAL | Status: DC | PRN

## 2019-05-11 MED ORDER — ENOXAPARIN SODIUM 40 MG/0.4ML ~~LOC~~ SOLN
40.0000 mg | SUBCUTANEOUS | Status: DC
  Administered 2019-05-12 – 2019-05-16 (×5): 40 mg via SUBCUTANEOUS
  Filled 2019-05-11 (×5): qty 0.4

## 2019-05-11 MED ORDER — TRAMADOL HCL 50 MG PO TABS
50.0000 mg | ORAL_TABLET | Freq: Once | ORAL | Status: AC
  Administered 2019-05-11: 50 mg via ORAL
  Filled 2019-05-11: qty 1

## 2019-05-11 MED ORDER — PRAVASTATIN SODIUM 40 MG PO TABS
40.0000 mg | ORAL_TABLET | Freq: Every evening | ORAL | Status: DC
  Administered 2019-05-11 – 2019-05-13 (×3): 40 mg via ORAL
  Filled 2019-05-11: qty 2
  Filled 2019-05-11 (×4): qty 1
  Filled 2019-05-11: qty 2

## 2019-05-11 MED ORDER — CARBIDOPA-LEVODOPA 25-100 MG PO TABS
1.0000 | ORAL_TABLET | Freq: Three times a day (TID) | ORAL | Status: DC
  Administered 2019-05-12 – 2019-05-16 (×11): 1 via ORAL
  Filled 2019-05-11 (×17): qty 1

## 2019-05-11 MED ORDER — MELATONIN 5 MG PO TABS
10.0000 mg | ORAL_TABLET | Freq: Every day | ORAL | Status: DC
  Administered 2019-05-11 – 2019-05-15 (×5): 10 mg via ORAL
  Filled 2019-05-11 (×6): qty 2

## 2019-05-11 MED ORDER — ROPINIROLE HCL 1 MG PO TABS
1.0000 mg | ORAL_TABLET | Freq: Every day | ORAL | Status: DC
  Administered 2019-05-11 – 2019-05-15 (×5): 1 mg via ORAL
  Filled 2019-05-11 (×7): qty 1

## 2019-05-11 MED ORDER — METOPROLOL SUCCINATE ER 25 MG PO TB24
12.5000 mg | ORAL_TABLET | Freq: Every day | ORAL | Status: DC
  Administered 2019-05-12 – 2019-05-16 (×5): 12.5 mg via ORAL
  Filled 2019-05-11 (×5): qty 1

## 2019-05-11 MED ORDER — SODIUM CHLORIDE 0.9% FLUSH
3.0000 mL | Freq: Two times a day (BID) | INTRAVENOUS | Status: DC
  Administered 2019-05-11 – 2019-05-16 (×9): 3 mL via INTRAVENOUS

## 2019-05-11 MED ORDER — ONDANSETRON HCL 4 MG/2ML IJ SOLN: 4.0000 mg | Freq: Four times a day (QID) | INTRAMUSCULAR | Status: DC | PRN

## 2019-05-11 MED ORDER — BISACODYL 5 MG PO TBEC
5.0000 mg | DELAYED_RELEASE_TABLET | Freq: Every day | ORAL | Status: DC | PRN
  Administered 2019-05-16: 5 mg via ORAL
  Filled 2019-05-11: qty 1

## 2019-05-11 MED ORDER — ACETAMINOPHEN 650 MG RE SUPP: 650.0000 mg | Freq: Four times a day (QID) | RECTAL | Status: DC | PRN

## 2019-05-11 MED ORDER — DEXTROSE-NACL 5-0.45 % IV SOLN: INTRAVENOUS | Status: AC

## 2019-05-11 MED ORDER — MIRABEGRON ER 50 MG PO TB24
50.0000 mg | ORAL_TABLET | Freq: Every day | ORAL | Status: DC
  Administered 2019-05-11 – 2019-05-15 (×5): 50 mg via ORAL
  Filled 2019-05-11 (×7): qty 1

## 2019-05-11 MED ORDER — CLONAZEPAM 0.5 MG PO TABS: 0.5000 mg | ORAL_TABLET | Freq: Every day | ORAL | Status: DC

## 2019-05-11 MED ORDER — CLOPIDOGREL BISULFATE 75 MG PO TABS
75.0000 mg | ORAL_TABLET | Freq: Every day | ORAL | Status: DC
  Administered 2019-05-12 – 2019-05-16 (×5): 75 mg via ORAL
  Filled 2019-05-11 (×5): qty 1

## 2019-05-11 MED ORDER — LORAZEPAM 2 MG/ML IJ SOLN
0.5000 mg | Freq: Once | INTRAMUSCULAR | Status: AC
  Administered 2019-05-12: 0.5 mg via INTRAVENOUS
  Filled 2019-05-11: qty 1

## 2019-05-11 MED ORDER — LIDOCAINE 5 % EX PTCH
1.0000 | MEDICATED_PATCH | CUTANEOUS | Status: DC
  Administered 2019-05-11 – 2019-05-15 (×5): 1 via TRANSDERMAL
  Filled 2019-05-11 (×6): qty 1

## 2019-05-11 MED ORDER — ACETAMINOPHEN 325 MG PO TABS
650.0000 mg | ORAL_TABLET | Freq: Four times a day (QID) | ORAL | Status: DC | PRN
  Administered 2019-05-15 – 2019-05-16 (×2): 650 mg via ORAL
  Filled 2019-05-11 (×2): qty 2

## 2019-05-11 NOTE — ED Provider Notes (Signed)
[facility] Emergency Department Provider Note   ____________________________________________   First MD Initiated Contact with Patient 05/11/19 1705     (approximate)  I have reviewed the triage vital signs and the nursing notes.   HISTORY  Chief Complaint Fall    HPI Maria Horton is a 79 y.o. female presents to the ED via EMS after a reported fall last evening.  Patient complained of back pain and EMS reports that she had 2 falls yesterday.  Patient is currently being seen at [facility] neurology by Dr. Manuella Ghazi.  Patient was initially seen by Randel Pigg, PA-C and a work-up was done addressing her fall issues.  When the daughter was present and initially patient was being discharged daughter states that patient was ambulatory without any assistance prior to being put on a new medication.  Since the medication started 4 days ago she has had multiple falls.  Daughter states that she called the office today and was told to bring her to the emergency department.  Patient routinely takes Sinemet IR 25/100 3 times daily and Sinemet CR 50/200 at at bedtime.  Recently amantadine 100 mg was added and Megestrol 40 mg.  Daughter states that it was after the amantadine that patient began having problems ambulating.  Prior to that patient did not have a history of multiple falls just on the Sinemet.  Daughter states that she called the office today and was told by the neurology department to bring her to the emergency department.  Patient with history of hypertension, CVA, TIA, vascular dementia, pacemaker insertion 2018.  In looking through patient's records she was to take the amantadine 50 mg (1/2 tablet) at bedtime for the first month and then increase to 100 mg at bedtime however daughter has been giving 100 mg at bedtime since the prescription was filled.    Past Medical History:  Diagnosis Date  . Anxiety   . Chronic back pain   . Constipation   . Depression     . Diverticulosis   . Fibromyalgia   . Gastroparesis   . GERD (gastroesophageal reflux disease)   . Hypertension   . Hypoglycemia   . Hypokalemia   . Osteoarthritis   . PONV (postoperative nausea and vomiting)   . Reflux   . Stroke ([place])   . TIA (transient ischemic attack)   . Vascular dementia ([place])   . Vitamin D deficiency     Patient Active Problem List   Diagnosis Date Noted  . 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 Sick sinus syndrome ([place]) 12/14/2016  . Fall at home, initial encounter 11/14/2016  . TIA (transient ischemic attack) 09/10/2016  . Chronic pain syndrome 09/30/2015  . GERD (gastroesophageal reflux disease) 09/29/2015  . 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 Fibromyalgia 09/26/2015  . Osteoarthritis 09/26/2015  . Hypertension 09/26/2015    Past Surgical History:  Procedure Laterality Date  . ABDOMINAL HYSTERECTOMY    . ABDOMINOPLASTY    . BREAST BIOPSY Left 2005   bengin, with clip  . BUNIONECTOMY Bilateral   . CHOLECYSTECTOMY    . COLON SURGERY    . FLEXIBLE BRONCHOSCOPY Bilateral 01/11/2018   Procedure: FLEXIBLE BRONCHOSCOPY;  Surgeon: Ottie Glazier, MD;  Location: [facility] ORS;  Service: Thoracic;  Laterality: Bilateral;  . LEFT HEART CATH AND CORONARY ANGIOGRAPHY Left 10/26/2017   Procedure: LEFT HEART CATH AND CORONARY ANGIOGRAPHY;  Surgeon: Isaias Cowman, MD;  Location: [facility];  Service: Cardiovascular;  Laterality:  Left;  . NECK SURGERY    . PACEMAKER INSERTION N/A 12/14/2016   Procedure: INSERTION PACEMAKER;  Surgeon: Isaias Cowman, MD;  Location: [facility] ORS;  Service: Cardiovascular;  Laterality: N/A;  . ROTATOR CUFF REPAIR    . TONSILLECTOMY      Prior to Admission medications   Medication Sig Start Date End Date Taking? Authorizing Provider  amantadine (SYMMETREL) 100 MG capsule Take 50 mg by mouth daily.    Yes [provider]  megestrol (MEGACE) 40 MG tablet Take 40 mg by mouth  daily.   Yes [provider]  albuterol (ACCUNEB) 1.25 MG/3ML nebulizer solution Take 3 mLs by nebulization every 6 (six) hours as needed for wheezing.    [provider]  aspirin EC 81 MG EC tablet Take 1 tablet (81 mg total) by mouth daily. 11/18/16   Dustin Flock, MD  b complex vitamins tablet Take 1 tablet daily by mouth.    [provider]  benzonatate (TESSALON) 100 MG capsule Take by mouth 2 (two) times daily.    [provider]  Bran Fiber 500 MG TABS Take 500 mg by mouth every other day.    [provider]  buPROPion (WELLBUTRIN XL) 300 MG 24 hr tablet Take 300 mg by mouth daily.  09/09/18   [provider]  carbidopa-levodopa (SINEMET CR) 50-200 MG tablet Take 1 tablet by mouth at bedtime.     [provider]  carbidopa-levodopa (SINEMET IR) 25-100 MG tablet Take 1 tablet by mouth 3 (three) times daily.    [provider]  cholecalciferol (VITAMIN D3) 25 MCG (1000 UT) tablet Take 1,000 Units by mouth daily.    [provider]  citalopram (CELEXA) 40 MG tablet Take 40 mg daily by mouth.     [provider]  clonazePAM (KLONOPIN) 0.5 MG tablet Take 1 tablet (0.5 mg total) by mouth at bedtime. 11/04/18   Stark Jock Jude, MD  clopidogrel (PLAVIX) 75 MG tablet Take 75 mg by mouth daily. 09/21/16   [provider]  esomeprazole (NEXIUM) 40 MG capsule Take 40 mg by mouth at bedtime.     [provider]  famotidine (PEPCID) 40 MG tablet Take 40 mg by mouth at bedtime. 03/06/19   [provider]  hydrOXYzine (ATARAX/VISTARIL) 50 MG tablet Take 50-100 mg by mouth at bedtime as needed (sleep).     [provider]  lidocaine (LIDODERM) 5 % Place 1 patch onto the skin every 12 (twelve) hours. Remove & Discard patch within 12 hours or as directed by MD 05/11/19 05/10/20  Sable Feil, PA-C  Melatonin 5 MG CHEW Chew 10 mg by mouth at bedtime.    [provider]  metoprolol  succinate (TOPROL-XL) 25 MG 24 hr tablet Take 0.5 tablets (12.5 mg total) by mouth daily. 11/05/18   Stark Jock Jude, MD  mirabegron ER (MYRBETRIQ) 50 MG TB24 tablet Take 1 tablet (50 mg total) by mouth daily. Patient taking differently: Take 50 mg by mouth at bedtime.  10/30/18   MacDiarmid, Nicki Reaper, MD  polyethylene glycol (MIRALAX / GLYCOLAX) 17 g packet Take 17 g by mouth daily as needed. 11/04/18   Stark Jock Jude, MD  pravastatin (PRAVACHOL) 40 MG tablet Take 1 tablet (40 mg total) by mouth every evening. 11/17/16   Dustin Flock, MD  rOPINIRole (REQUIP) 0.5 MG tablet Take 1 mg by mouth at bedtime.     [provider]  traZODone (DESYREL) 100 MG tablet Take 200 mg by mouth at  bedtime.     [provider]  triamterene-hydrochlorothiazide (MAXZIDE-25) 37.5-25 MG tablet Take 0.5 tablets by mouth daily.     [provider]  vitamin B-12 (CYANOCOBALAMIN) 1000 MCG tablet Take 1,000 mcg by mouth daily.    [provider]    Allergies 5-alpha reductase inhibitors, Ace inhibitors, Beta adrenergic blockers, Calcium channel blockers, Latex, and Tape  Family History  Problem Relation Age of Onset  . Hypertension Mother   . Breast cancer Sister 54    Social History Social History   Tobacco Use  . Smoking status: Never Smoker  . Smokeless tobacco: Never Used  Substance Use Topics  . Alcohol use: No  . Drug use: No    Review of Systems Constitutional: No fever/chills, positive generalized weakness. Eyes: No visual changes. ENT: No sore throat. Cardiovascular: Denies chest pain. Respiratory: Denies shortness of breath. Gastrointestinal: No abdominal pain.  No nausea, no vomiting.  No diarrhea.  Genitourinary: Negative for dysuria. Musculoskeletal: Positive multiple aches secondary to numerous falls.  Positive right wrist pain, right ankle pain. Skin: Positive for bruising. Neurological: Negative for headaches, focal weakness or  numbness.   ____________________________________________   PHYSICAL EXAM:  VITAL SIGNS: ED Triage Vitals  Enc Vitals Group     BP 05/11/19 1201 (!) 150/93     Pulse Rate 05/11/19 1201 61     Resp 05/11/19 1201 18     Temp 05/11/19 1201 98.8 F (37.1 C)     Temp Source 05/11/19 1201 Oral     SpO2 05/11/19 1201 99 %     Weight 05/11/19 1213 134 lb (60.8 kg)     Height 05/11/19 1213 _0  (1.626 m)     Head Circumference --      Peak Flow --      Pain Score 05/11/19 1213 6     Pain Loc --      Pain Edu? --      Excl. in [place]? --     Constitutional: Alert and oriented. Well appearing and in no acute distress.  Patient is cooperative, pleasant and answers questions. Eyes: Conjunctivae are normal. PERRL. EOMI. Head: Atraumatic. Nose: No trauma. Mouth/Throat: Mucous membranes are moist.  Oropharynx non-erythematous. Neck: No stridor.   Cardiovascular: Normal rate, regular rhythm. Grossly normal heart sounds.  Good peripheral circulation. Respiratory: Normal respiratory effort.  No retractions. Lungs CTAB. Gastrointestinal: Soft and nontender. No distention.  Bowel sounds are active x4 quadrants. Musculoskeletal: Patient is able to move upper and lower extremities without any assistance.  There are scattered ecchymotic areas in various stages of healing over the extremities secondary to fall.  Patient is tender on palpation of her right ankle which she has been wrapped with an Ace wrap.  She also has a wrist splint on for a sprain that she was seen earlier.  Patient is unable to stand without moderate assistance.  It was noted prior to discharge that patient could not stand and also stiffens up and begins leaning backwards. Neurologic:  Normal speech and language. No gross focal neurologic deficits are appreciated.  Skin:  Skin is warm, dry and intact.  Ecchymosis is noted above. Psychiatric: Mood and affect are normal. Speech and behavior are  normal.  ____________________________________________   LABS (all labs ordered are listed, but only abnormal results are displayed)  Labs Reviewed  COMPREHENSIVE METABOLIC PANEL - Abnormal; Notable for the following components:      Result Value   Glucose, Bld 67 (*)  Creatinine, Ser 1.20 (*)    Total Protein 6.3 (*)    Total Bilirubin 1.3 (*)    GFR calc non Af Amer 44 (*)    GFR calc Af Amer 51 (*)    All other components within normal limits  CBC WITH DIFFERENTIAL/PLATELET  URINALYSIS, COMPLETE (UACMP) WITH MICROSCOPIC  TROPONIN I (HIGH SENSITIVITY)  TROPONIN I (HIGH SENSITIVITY)   ____________________________________________  EKG  Atrial paced rhythm with a ventricular rate of 74 Reviewed by Dr. Charna Archer ____________________________________________  [facility] Official radiology report(s): DG Chest 2 View  Result Date: 05/11/2019 CLINICAL DATA:  Multiple falls including 2 yesterday. EXAM: CHEST - 2 VIEW COMPARISON:  11/04/2018 FINDINGS: Normal sized heart. Clear lungs with normal vascularity. Left subclavian bipolar pacemaker leads in satisfactory position. Thoracic spine degenerative changes. No fracture or pneumothorax seen. IMPRESSION: No acute abnormality. Electronically Signed   By: Claudie Revering M.D.   On: 05/11/2019 17:50   DG Cervical Spine 2-3 Views  Result Date: 05/11/2019 CLINICAL DATA:  Neck pain following a fall last night. EXAM: CERVICAL SPINE - 2-3 VIEW COMPARISON:  Cervical spine CT dated 10/31/2018. Single-view chest dated 11/04/2018. FINDINGS: Again demonstrated is interbody and anterior screw and plate fusion at the X3-2 level. Large anterior spurs are again demonstrated at the C3-4 and C4-5 levels with moderate anterior spur formation at the C6-7 level. Mild anterior spur formation at the C3-4 level. Multilevel facet degenerative changes. No prevertebral soft tissue swelling, fracture or subluxation seen. Left subclavian bipolar pacemaker.  IMPRESSION: 1. No fracture or subluxation. 2. Stable postoperative and degenerative changes, as described above. Electronically Signed   By: Claudie Revering M.D.   On: 05/11/2019 16:01   DG Lumbar Spine 2-3 Views  Result Date: 05/11/2019 CLINICAL DATA:  Lumbosacral back pain secondary to fall. Fall last night striking night stand. EXAM: LUMBAR SPINE - 2-3 VIEW COMPARISON:  Lumbar spine CT 10/31/2018 FINDINGS: The alignment is maintained. Vertebral body heights are normal. There is no listhesis. The posterior elements are intact. Endplate spurring at multiple levels with preservation of disc spaces. There is prominent facet hypertrophy at multiple levels. No fracture. Sacroiliac joints are congruent. IMPRESSION: No fracture or subluxation of the lumbar spine. Multilevel spondylosis. Electronically Signed   By: Keith Rake M.D.   On: 05/11/2019 15:36   DG Wrist Complete Right  Result Date: 05/11/2019 CLINICAL DATA:  Fall, pain EXAM: RIGHT WRIST - COMPLETE 3+ VIEW COMPARISON:  None. FINDINGS: There is no evidence of fracture or dislocation. There is no evidence of arthropathy or other focal bone abnormality. Soft tissues are unremarkable. IMPRESSION: No fracture or dislocation of the right wrist. Electronically Signed   By: Eddie Candle M.D.   On: 05/11/2019 15:45   DG Ankle Complete Right  Result Date: 05/11/2019 CLINICAL DATA:  Right ankle pain after fall. Fall last night striking night stand. EXAM: RIGHT ANKLE - COMPLETE 3+ VIEW COMPARISON:  None. FINDINGS: There is no evidence of fracture, dislocation, or joint effusion. The ankle mortise is preserved with mild degenerative joint space narrowing. There is an Achilles tendon enthesophyte. Soft tissues are unremarkable. IMPRESSION: No fracture or subluxation of the right ankle. Electronically Signed   By: Keith Rake M.D.   On: 05/11/2019 15:34   CT Head Wo Contrast  Result Date: 05/11/2019 CLINICAL DATA:  Fall EXAM: CT HEAD WITHOUT CONTRAST  TECHNIQUE: Contiguous axial images were obtained from the base of the skull through the vertex without intravenous contrast. COMPARISON:  CT head 10/31/2018  FINDINGS: Brain: No evidence of acute infarction, hemorrhage, hydrocephalus, extra-axial collection or mass lesion/mass effect. Symmetric prominence of the ventricles, cisterns and sulci compatible with parenchymal volume loss. Patchy areas of white matter hypoattenuation are most compatible with chronic microvascular angiopathy. Vascular: Atherosclerotic calcification of the carotid siphons. No hyperdense vessel. Skull: No calvarial fracture or suspicious osseous lesion. No scalp swelling or hematoma. Sinuses/Orbits: Paranasal sinuses and mastoid air cells are predominantly clear. Orbital structures are unremarkable aside from prior lens extractions. Other: None IMPRESSION: 1. No acute intracranial abnormality. No scalp swelling or calvarial fracture. 2. Chronic microvascular angiopathy and parenchymal volume loss. Electronically Signed   By: Lovena Le M.D.   On: 05/11/2019 19:09    ____________________________________________   PROCEDURES  Procedure(s) performed (including Critical Care):  Procedures   ____________________________________________   INITIAL IMPRESSION / ASSESSMENT AND PLAN / ED COURSE  As part of my medical decision making, I reviewed the following data within the electronic MEDICAL RECORD NUMBER Notes from prior ED visits and [place] Controlled Substance Database  ----------------------------------------- 7:23 PM on 05/11/2019 ----------------------------------------- At this time patient is being transferred to room 12 and Dr. Charna Archer has been aware of patient's situation and added head CT, labs and EKG. Patient was also given Benadryl 25 mg to see if this would help with any tardive dyskinesia resulting from her new medication. Daughter is with patient and is able to give more information. It was noted and the notes from  [facility] that patient was to start the amantadine 50 mg at bedtime for 1 month and then increase to 100 mg to avoid side effects. Patient however has been getting 100 mg at bedtime as daughter wasn't clear on the instructions and 2 different prescriptions for sent to the pharmacy.   ____________________________________________   FINAL CLINICAL IMPRESSION(S) / ED DIAGNOSES  Final diagnoses:  Fall in home, initial encounter  Sprain of right wrist, initial encounter  Sprain of right ankle, unspecified ligament, initial encounter  Tardive dyskinesia     ED Discharge Orders         Ordered    lidocaine (LIDODERM) 5 %  Every 12 hours     05/11/19 1604           Note:  This document was prepared using Dragon voice recognition software and may include unintentional dictation errors.    Johnn Hai, PA-C 05/11/19 Lynne Leader, MD 05/11/19 2056

## 2019-05-11 NOTE — ED Notes (Signed)
Late entry:  Family states she was recently started on some different meds   Since she had had some falls  Pt is very stiff when trying to get her up to w/c  Very shaky on her feet  Provider aware

## 2019-05-11 NOTE — Discharge Instructions (Addendum)
Follow discharge care instruction.  Wear wrist and ankle support for 3 to 5 days as needed.

## 2019-05-11 NOTE — ED Triage Notes (Signed)
First RN Note: Pt presents to ED via [facility] with c/o back pain. Per EMS pt had 2 falls yesterday, 1st fall she hit the night stand and was assisted back to bed, then pt fell again. Per EMS pt had recent medication changes for parkinsons which has suddenly worsened over night.   78/50 (per EMS pt has hypotension at baseline) 98% 2L (chronic)

## 2019-05-11 NOTE — Progress Notes (Signed)
Patient arrived to me and was aoX4. Called report to [place] on 1A. Was called back into patient's room and she was upside down in the bed and trying to get out of bed and talking nonsense. Daughter said she has never done this before. She answerers orientation questions appropriately, but is very anxious. Called back to 1A to inform and notified Attending MD. She ordered ativan.

## 2019-05-11 NOTE — H&P (Signed)
History and Physical    Maria Horton:[id number] DOB: 03-13-1940 DOA: 05/11/2019  PCP: Baxter Hire, MD   Patient coming from: Home I have personally briefly reviewed patient's old medical records in [place]  Chief Complaint: Fall x3  HPI: Maria Horton is a 79 y.o. female with medical history significant for hypertension, TIA, CKD 3, depression, restless legs and insomnia, with history of dyskinesia/parkinsonism, followed by neurologist, Dr. Manuella Ghazi on Sinemet, started on amantadine on 05/07/2019 who was brought into the emergency room after she had 3 falls in the last 48 hours.  Most of the history is taken from the ER records.  Apparently in spite of parkinsonism patient has not had any falls until being started on the amantadine.  She was supposedly supposed to slowly titrate the dose up but daughter started her on the 100 mg dose from the get go.  Most of the history is taken from the daughter at the bedside as patient is disoriented  ED Course: In the emergency room vitals were within normal limits except for slightly elevated blood pressure of 150/93.  Blood work mostly unremarkable though she was noted to have slight hypoglycemia of 67.  CT head showed chronic microvascular angiopathy.  Trauma work-up with extensive imaging showed no other acute injury.  Hospitalist consult requested for observation.  Review of Systems: Unreliable due to confusion  Past Medical History:  Diagnosis Date  . Anxiety   . Chronic back pain   . Constipation   . Depression   . Diverticulosis   . Fibromyalgia   . Gastroparesis   . GERD (gastroesophageal reflux disease)   . Hypertension   . Hypoglycemia   . Hypokalemia   . Osteoarthritis   . PONV (postoperative nausea and vomiting)   . Reflux   . Stroke ([place])   . TIA (transient ischemic attack)   . Vascular dementia ([place])   . Vitamin D deficiency     Past Surgical History:  Procedure Laterality Date  . ABDOMINAL HYSTERECTOMY      . ABDOMINOPLASTY    . BREAST BIOPSY Left 2005   bengin, with clip  . BUNIONECTOMY Bilateral   . CHOLECYSTECTOMY    . COLON SURGERY    . FLEXIBLE BRONCHOSCOPY Bilateral 01/11/2018   Procedure: FLEXIBLE BRONCHOSCOPY;  Surgeon: Ottie Glazier, MD;  Location: [facility];  Service: Thoracic;  Laterality: Bilateral;  . LEFT HEART CATH AND CORONARY ANGIOGRAPHY Left 10/26/2017   Procedure: LEFT HEART CATH AND CORONARY ANGIOGRAPHY;  Surgeon: Isaias Cowman, MD;  Location: [facility];  Service: Cardiovascular;  Laterality: Left;  . NECK SURGERY    . PACEMAKER INSERTION N/A 12/14/2016   Procedure: INSERTION PACEMAKER;  Surgeon: Isaias Cowman, MD;  Location: [facility];  Service: Cardiovascular;  Laterality: N/A;  . ROTATOR CUFF REPAIR    . TONSILLECTOMY       reports that she has never smoked. She has never used smokeless tobacco. She reports that she does not drink alcohol or use drugs.  Allergies  Allergen Reactions  . 5-Alpha Reductase Inhibitors   . Ace Inhibitors Other (See Comments)    achy  . Beta Adrenergic Blockers Other (See Comments)    Makes achy/feel sick  . Calcium Channel Blockers Other (See Comments)    Feels achy like sick  . Latex Rash  . Tape Rash    Family History  Problem Relation Age of Onset  . Hypertension Mother   . Breast cancer Sister 42  Prior to Admission medications   Medication Sig Start Date End Date Taking? Authorizing Provider  amantadine (SYMMETREL) 100 MG capsule Take 50 mg by mouth daily.    Yes [provider]  megestrol (MEGACE) 40 MG tablet Take 40 mg by mouth daily.   Yes [provider]  albuterol (ACCUNEB) 1.25 MG/3ML nebulizer solution Take 3 mLs by nebulization every 6 (six) hours as needed for wheezing.    [provider]  aspirin EC 81 MG EC tablet Take 1 tablet (81 mg total) by mouth daily. 11/18/16   Dustin Flock, MD  b complex vitamins tablet Take 1 tablet daily by mouth.     [provider]  benzonatate (TESSALON) 100 MG capsule Take by mouth 2 (two) times daily.    [provider]  Bran Fiber 500 MG TABS Take 500 mg by mouth every other day.    [provider]  buPROPion (WELLBUTRIN XL) 300 MG 24 hr tablet Take 300 mg by mouth daily.  09/09/18   [provider]  carbidopa-levodopa (SINEMET CR) 50-200 MG tablet Take 1 tablet by mouth at bedtime.     [provider]  carbidopa-levodopa (SINEMET IR) 25-100 MG tablet Take 1 tablet by mouth 3 (three) times daily.    [provider]  cholecalciferol (VITAMIN D3) 25 MCG (1000 UT) tablet Take 1,000 Units by mouth daily.    [provider]  citalopram (CELEXA) 40 MG tablet Take 40 mg daily by mouth.     [provider]  clonazePAM (KLONOPIN) 0.5 MG tablet Take 1 tablet (0.5 mg total) by mouth at bedtime. 11/04/18   Stark Jock Jude, MD  clopidogrel (PLAVIX) 75 MG tablet Take 75 mg by mouth daily. 09/21/16   [provider]  esomeprazole (NEXIUM) 40 MG capsule Take 40 mg by mouth at bedtime.     [provider]  famotidine (PEPCID) 40 MG tablet Take 40 mg by mouth at bedtime. 03/06/19   [provider]  hydrOXYzine (ATARAX/VISTARIL) 50 MG tablet Take 50-100 mg by mouth at bedtime as needed (sleep).     [provider]  lidocaine (LIDODERM) 5 % Place 1 patch onto the skin every 12 (twelve) hours. Remove & Discard patch within 12 hours or as directed by MD 05/11/19 05/10/20  Sable Feil, PA-C  Melatonin 5 MG CHEW Chew 10 mg by mouth at bedtime.    [provider]  metoprolol succinate (TOPROL-XL) 25 MG 24 hr tablet Take 0.5 tablets (12.5 mg total) by mouth daily. 11/05/18   Stark Jock Jude, MD  mirabegron ER (MYRBETRIQ) 50 MG TB24 tablet Take 1 tablet (50 mg total) by mouth daily. Patient taking differently: Take 50 mg by mouth at bedtime.  10/30/18   MacDiarmid, Nicki Reaper, MD  polyethylene glycol (MIRALAX / GLYCOLAX) 17 g packet  Take 17 g by mouth daily as needed. 11/04/18   Stark Jock Jude, MD  pravastatin (PRAVACHOL) 40 MG tablet Take 1 tablet (40 mg total) by mouth every evening. 11/17/16   Dustin Flock, MD  rOPINIRole (REQUIP) 0.5 MG tablet Take 1 mg by mouth at bedtime.     [provider]  traZODone (DESYREL) 100 MG tablet Take 200 mg by mouth at bedtime.     [provider]  triamterene-hydrochlorothiazide (MAXZIDE-25) 37.5-25 MG tablet Take 0.5 tablets by mouth daily.     [provider]  vitamin B-12 (CYANOCOBALAMIN) 1000 MCG tablet Take 1,000 mcg by mouth daily.    [provider]  Physical Exam: Vitals:   05/11/19 1201 05/11/19 1213 05/11/19 1825 05/11/19 1832  BP: (!) 150/93  (!) 118/104 103/76  Pulse: 61  62 67  Resp: _0 Temp: 98.8 F (37.1 C)     TempSrc: Oral     SpO2: 99%  99% 99%  Weight:  60.8 kg    Height:  _1  (1.626 m)       Vitals:   05/11/19 1201 05/11/19 1213 05/11/19 1825 05/11/19 1832  BP: (!) 150/93  (!) 118/104 103/76  Pulse: 61  62 67  Resp: _2 Temp: 98.8 F (37.1 C)     TempSrc: Oral     SpO2: 99%  99% 99%  Weight:  60.8 kg    Height:  _3  (1.626 m)      Constitutional: Alert and awake, oriented x2, not in any acute distress. Eyes: PERLA, EOMI, irises appear normal, anicteric sclera,  ENMT: external ears and nose appear normal, normal hearing             Lips appears normal, oropharynx mucosa, tongue, posterior pharynx appear normal  Neck: neck appears normal, no masses, normal ROM, no thyromegaly, no JVD  CVS: S1-S2 clear, no murmur rubs or gallops,  , no carotid bruits, pedal pulses palpable, No LE edema Respiratory:  clear to auscultation bilaterally, no wheezing, rales or rhonchi. Respiratory effort normal. No accessory muscle use.  Abdomen: soft nontender, nondistended, normal bowel sounds, no hepatosplenomegaly, no hernias Musculoskeletal: : no cyanosis, clubbing , no contractures or atrophy Neuro: Cranial  nerves II-XII intact, sensation, reflexes normal, strength, has tremors of the extremities Psych: Calm stable mood but appears somewhat anxious Skin: no rashes or lesions or ulcers, no induration or nodules   Labs on Admission: I have personally reviewed following labs and imaging studies  CBC: Recent Labs  Lab 05/11/19 1752  WBC 9.3  NEUTROABS 6.3  HGB 12.4  HCT 36.2  MCV 91.2  PLT 275   Basic Metabolic Panel: Recent Labs  Lab 05/11/19 1752  NA 141  K 3.5  CL 100  CO2 29  GLUCOSE 67*  BUN 18  CREATININE 1.20*  CALCIUM 9.1   GFR: Estimated Creatinine Clearance: 34.4 mL/min (A) (by C-G formula based on SCr of 1.2 mg/dL (H)). Liver Function Tests: Recent Labs  Lab 05/11/19 1752  AST 24  ALT 6  ALKPHOS 42  BILITOT 1.3*  PROT 6.3*  ALBUMIN 3.8   No results for input(s): LIPASE, AMYLASE in the last 168 hours. No results for input(s): AMMONIA in the last 168 hours. Coagulation Profile: No results for input(s): INR, PROTIME in the last 168 hours. Cardiac Enzymes: No results for input(s): CKTOTAL, CKMB, CKMBINDEX, TROPONINI in the last 168 hours. BNP (last 3 results) No results for input(s): PROBNP in the last 8760 hours. HbA1C: No results for input(s): HGBA1C in the last 72 hours. CBG: No results for input(s): GLUCAP in the last 168 hours. Lipid Profile: No results for input(s): CHOL, HDL, LDLCALC, TRIG, CHOLHDL, LDLDIRECT in the last 72 hours. Thyroid Function Tests: No results for input(s): TSH, T4TOTAL, FREET4, T3FREE, THYROIDAB in the last 72 hours. Anemia Panel: No results for input(s): VITAMINB12, FOLATE, FERRITIN, TIBC, IRON, RETICCTPCT in the last 72 hours. Urine analysis:    Component Value Date/Time   COLORURINE AMBER (A) 10/31/2018 1950   APPEARANCEUR CLOUDY (A) 10/31/2018 1950   APPEARANCEUR Cloudy (A) 10/30/2018 1342   LABSPEC 1.026 10/31/2018 1950   PHURINE  5.0 10/31/2018 1950   GLUCOSEU NEGATIVE 10/31/2018 1950   HGBUR NEGATIVE 10/31/2018  [place] NEGATIVE 10/31/2018 1950   BILIRUBINUR Negative 10/30/2018 1342   KETONESUR 5 (A) 10/31/2018 [place] NEGATIVE 10/31/2018 1950   NITRITE NEGATIVE 10/31/2018 1950   LEUKOCYTESUR SMALL (A) 10/31/2018 1950    Radiological Exams on Admission: DG Chest 2 View  Result Date: 05/11/2019 CLINICAL DATA:  Multiple falls including 2 yesterday. EXAM: CHEST - 2 VIEW COMPARISON:  11/04/2018 FINDINGS: Normal sized heart. Clear lungs with normal vascularity. Left subclavian bipolar pacemaker leads in satisfactory position. Thoracic spine degenerative changes. No fracture or pneumothorax seen. IMPRESSION: No acute abnormality. Electronically Signed   By: Claudie Revering M.D.   On: 05/11/2019 17:50   DG Cervical Spine 2-3 Views  Result Date: 05/11/2019 CLINICAL DATA:  Neck pain following a fall last night. EXAM: CERVICAL SPINE - 2-3 VIEW COMPARISON:  Cervical spine CT dated 10/31/2018. Single-view chest dated 11/04/2018. FINDINGS: Again demonstrated is interbody and anterior screw and plate fusion at the J2-8 level. Large anterior spurs are again demonstrated at the C3-4 and C4-5 levels with moderate anterior spur formation at the C6-7 level. Mild anterior spur formation at the C3-4 level. Multilevel facet degenerative changes. No prevertebral soft tissue swelling, fracture or subluxation seen. Left subclavian bipolar pacemaker. IMPRESSION: 1. No fracture or subluxation. 2. Stable postoperative and degenerative changes, as described above. Electronically Signed   By: Claudie Revering M.D.   On: 05/11/2019 16:01   DG Lumbar Spine 2-3 Views  Result Date: 05/11/2019 CLINICAL DATA:  Lumbosacral back pain secondary to fall. Fall last night striking night stand. EXAM: LUMBAR SPINE - 2-3 VIEW COMPARISON:  Lumbar spine CT 10/31/2018 FINDINGS: The alignment is maintained. Vertebral body heights are normal. There is no listhesis. The posterior elements are intact. Endplate spurring at multiple levels with  preservation of disc spaces. There is prominent facet hypertrophy at multiple levels. No fracture. Sacroiliac joints are congruent. IMPRESSION: No fracture or subluxation of the lumbar spine. Multilevel spondylosis. Electronically Signed   By: Keith Rake M.D.   On: 05/11/2019 15:36   DG Wrist Complete Right  Result Date: 05/11/2019 CLINICAL DATA:  Fall, pain EXAM: RIGHT WRIST - COMPLETE 3+ VIEW COMPARISON:  None. FINDINGS: There is no evidence of fracture or dislocation. There is no evidence of arthropathy or other focal bone abnormality. Soft tissues are unremarkable. IMPRESSION: No fracture or dislocation of the right wrist. Electronically Signed   By: Eddie Candle M.D.   On: 05/11/2019 15:45   DG Ankle Complete Right  Result Date: 05/11/2019 CLINICAL DATA:  Right ankle pain after fall. Fall last night striking night stand. EXAM: RIGHT ANKLE - COMPLETE 3+ VIEW COMPARISON:  None. FINDINGS: There is no evidence of fracture, dislocation, or joint effusion. The ankle mortise is preserved with mild degenerative joint space narrowing. There is an Achilles tendon enthesophyte. Soft tissues are unremarkable. IMPRESSION: No fracture or subluxation of the right ankle. Electronically Signed   By: Keith Rake M.D.   On: 05/11/2019 15:34   CT Head Wo Contrast  Result Date: 05/11/2019 CLINICAL DATA:  Fall EXAM: CT HEAD WITHOUT CONTRAST TECHNIQUE: Contiguous axial images were obtained from the base of the skull through the vertex without intravenous contrast. COMPARISON:  CT head 10/31/2018 FINDINGS: Brain: No evidence of acute infarction, hemorrhage, hydrocephalus, extra-axial collection or mass lesion/mass effect. Symmetric prominence of the ventricles, cisterns and sulci compatible with parenchymal volume loss. Patchy areas of white matter  hypoattenuation are most compatible with chronic microvascular angiopathy. Vascular: Atherosclerotic calcification of the carotid siphons. No hyperdense vessel.  Skull: No calvarial fracture or suspicious osseous lesion. No scalp swelling or hematoma. Sinuses/Orbits: Paranasal sinuses and mastoid air cells are predominantly clear. Orbital structures are unremarkable aside from prior lens extractions. Other: None IMPRESSION: 1. No acute intracranial abnormality. No scalp swelling or calvarial fracture. 2. Chronic microvascular angiopathy and parenchymal volume loss. Electronically Signed   By: Lovena Le M.D.   On: 05/11/2019 19:09    EKG: Independently reviewed.   Assessment/Plan Principal Problem:   Recurrent falls -Etiology could be related to medication given relation with starting of amantadine, as well as polypharmacy, and borderline hypoglycemia 67, and may also have to do with progression of her chronic parkinsonism.  Acute CVA less likely given no focal neurologic deficits.  No obvious stigmata of infection -Neurology consult for guidance on continued management -CT head showed no acute changes.  Cannot get MRI because of pacemaker status.  Can consider repeating CT in 48 hours to evaluate for CVA, pending further neurology recommendations -Hold amantadine -Correct hypoglycemia and optimize nutrition -Follow-up urinalysis to assess for UTI -Physical therapy evaluation    Parkinsonism ([place])   Medication adverse effect, initial encounter -Continue Sinemet.  Hold amantadine for now pending neurology consult    Polypharmacy -Patient is on clonazepam, Myrbetriq, ropinirole, melatonin, Sinemet as well as diuretic/Dyazide, bupropion, citalopram, hydroxyzine, trazodone, Klonopin -Neurology consult as above for optimization    Hypertension -Hold diuretic treatment as this could increase fall risk in the elderly -Continue metoprolol    Anxiety and depression -Continue Celexa and Wellbutrin.  Hold trazodone, hydroxyzine for now    History of TIA (transient ischemic attack) -Continue aspirin, Plavix    Restless leg syndrome -Continue  Requip    Hypoglycemia -IV hydration with D5 half NS    DVT prophylaxis: Lovenox  Code Status: full code  Family Communication:  None. Did not get daughter on the phone Disposition Plan: Back to previous home environment Consults called: neurology  Status:inp    Athena Masse MD [facility]     05/11/2019, 8:18 PM

## 2019-05-11 NOTE — ED Triage Notes (Signed)
Pt to ED via [facility] for pain s/p fall last night. Pt states that she is having pain in her back and neck. Pt is A & O, in NAD.

## 2019-05-11 NOTE — ED Provider Notes (Signed)
[facility] Emergency Department Provider Note   ____________________________________________   First MD Initiated Contact with Patient 05/11/19 1421     (approximate)  I have reviewed the triage vital signs and the nursing notes.   HISTORY  Chief Complaint Fall    HPI Maria Horton is a 79 y.o. female patient complain of low back pain, right wrist pain, and right ankle pain secondary to a fall at home.  Patient has history of increased falls in the home.  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  Patient states she fell against the nightstand and then to the floor.  Patient denies LOC or head injury.  Patient states a recent change her medication for Parkinson which she believes is increasing her falls.  Patient rates her pain as a 6/10.   Patient take aspirin and Plavix.   Past Medical History:  Diagnosis Date  . Anxiety   . Chronic back pain   . Constipation   . Depression   . Diverticulosis   . Fibromyalgia   . Gastroparesis   . GERD (gastroesophageal reflux disease)   . Hypertension   . Hypoglycemia   . Hypokalemia   . Osteoarthritis   . PONV (postoperative nausea and vomiting)   . Reflux   . Stroke ([place])   . TIA (transient ischemic attack)   . Vascular dementia ([place])   . Vitamin D deficiency     Patient Active Problem List   Diagnosis Date Noted  . 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 Sick sinus syndrome ([place]) 12/14/2016  . Fall at home, initial encounter 11/14/2016  . TIA (transient ischemic attack) 09/10/2016  . Chronic pain syndrome 09/30/2015  . GERD (gastroesophageal reflux disease) 09/29/2015  . Severe recurrent major depression without psychotic features ([place]) 09/26/2015  . Fibromyalgia 09/26/2015  . Osteoarthritis 09/26/2015  . Hypertension 09/26/2015    Past Surgical History:  Procedure Laterality Date  . ABDOMINAL HYSTERECTOMY    . ABDOMINOPLASTY    . BREAST BIOPSY Left 2005   bengin, with clip  .  BUNIONECTOMY Bilateral   . CHOLECYSTECTOMY    . COLON SURGERY    . FLEXIBLE BRONCHOSCOPY Bilateral 01/11/2018   Procedure: FLEXIBLE BRONCHOSCOPY;  Surgeon: Ottie Glazier, MD;  Location: [facility] ORS;  Service: Thoracic;  Laterality: Bilateral;  . LEFT HEART CATH AND CORONARY ANGIOGRAPHY Left 10/26/2017   Procedure: LEFT HEART CATH AND CORONARY ANGIOGRAPHY;  Surgeon: Isaias Cowman, MD;  Location: [facility] [place] [facility];  Service: Cardiovascular;  Laterality: Left;  . NECK SURGERY    . PACEMAKER INSERTION N/A 12/14/2016   Procedure: INSERTION PACEMAKER;  Surgeon: Isaias Cowman, MD;  Location: [facility] ORS;  Service: Cardiovascular;  Laterality: N/A;  . ROTATOR CUFF REPAIR    . TONSILLECTOMY      Prior to Admission medications   Medication Sig Start Date End Date Taking? Authorizing Provider  albuterol (ACCUNEB) 1.25 MG/3ML nebulizer solution Take 3 mLs by nebulization every 6 (six) hours as needed for wheezing.    [provider]  aspirin EC 81 MG EC tablet Take 1 tablet (81 mg total) by mouth daily. 11/18/16   Dustin Flock, MD  b complex vitamins tablet Take 1 tablet daily by mouth.    [provider]  benzonatate (TESSALON) 100 MG capsule Take by mouth 2 (two) times daily.    [provider]  Bran Fiber 500 MG TABS Take 500 mg by mouth every other day.    [provider]  buPROPion [facility]  XL) 300 MG 24 hr tablet Take 300 mg by mouth daily.  09/09/18   [provider]  carbidopa-levodopa (SINEMET CR) 50-200 MG tablet Take 1 tablet by mouth at bedtime.     [provider]  carbidopa-levodopa (SINEMET IR) 25-100 MG tablet Take 1 tablet by mouth 3 (three) times daily.    [provider]  cholecalciferol (VITAMIN D3) 25 MCG (1000 UT) tablet Take 1,000 Units by mouth daily.    [provider]  citalopram (CELEXA) 40 MG tablet Take 40 mg daily by mouth.     [provider]  clonazePAM (KLONOPIN) 0.5 MG  tablet Take 1 tablet (0.5 mg total) by mouth at bedtime. 11/04/18   Stark Jock Jude, MD  clopidogrel (PLAVIX) 75 MG tablet Take 75 mg by mouth daily. 09/21/16   [provider]  esomeprazole (NEXIUM) 40 MG capsule Take 40 mg by mouth at bedtime.     [provider]  famotidine (PEPCID) 40 MG tablet Take 40 mg by mouth at bedtime. 03/06/19   [provider]  hydrOXYzine (ATARAX/VISTARIL) 50 MG tablet Take 50-100 mg by mouth at bedtime as needed (sleep).     [provider]  lidocaine (LIDODERM) 5 % Place 1 patch onto the skin every 12 (twelve) hours. Remove & Discard patch within 12 hours or as directed by MD 05/11/19 05/10/20  Sable Feil, PA-C  Melatonin 5 MG CHEW Chew 10 mg by mouth at bedtime.    [provider]  metoprolol succinate (TOPROL-XL) 25 MG 24 hr tablet Take 0.5 tablets (12.5 mg total) by mouth daily. 11/05/18   Stark Jock Jude, MD  mirabegron ER (MYRBETRIQ) 50 MG TB24 tablet Take 1 tablet (50 mg total) by mouth daily. Patient taking differently: Take 50 mg by mouth at bedtime.  10/30/18   MacDiarmid, Nicki Reaper, MD  polyethylene glycol (MIRALAX / GLYCOLAX) 17 g packet Take 17 g by mouth daily as needed. 11/04/18   Stark Jock Jude, MD  pravastatin (PRAVACHOL) 40 MG tablet Take 1 tablet (40 mg total) by mouth every evening. 11/17/16   Dustin Flock, MD  rOPINIRole (REQUIP) 0.5 MG tablet Take 1 mg by mouth at bedtime.     [provider]  traZODone (DESYREL) 100 MG tablet Take 200 mg by mouth at bedtime.     [provider]  triamterene-hydrochlorothiazide (MAXZIDE-25) 37.5-25 MG tablet Take 0.5 tablets by mouth daily.     [provider]  vitamin B-12 (CYANOCOBALAMIN) 1000 MCG tablet Take 1,000 mcg by mouth daily.    [provider]    Allergies 5-alpha reductase inhibitors, Ace inhibitors, Beta adrenergic blockers, Calcium channel blockers, Latex, and Tape  Family History  Problem Relation Age of Onset  . Hypertension  Mother   . Breast cancer Sister 57    Social History Social History   Tobacco Use  . Smoking status: Never Smoker  . Smokeless tobacco: Never Used  Substance Use Topics  . Alcohol use: No  . Drug use: No    Review of Systems Constitutional: No fever/chills Eyes: No visual changes. ENT: No sore throat. Cardiovascular: Denies chest pain. Respiratory: Denies shortness of breath. Gastrointestinal: No abdominal pain.  No nausea, no vomiting.  No diarrhea.  No constipation. Genitourinary: Negative for dysuria. Musculoskeletal: Negative for back pain. Skin: Negative for rash. Neurological: Negative for headaches, focal weakness or numbness.  Parkinson's and dementia Endocrine:  Hypertension hypothyroidism Allergic/Immunilogical: See allergy list.  ____________________________________________   PHYSICAL EXAM:  VITAL SIGNS: ED Triage Vitals  Enc Vitals Group     BP 05/11/19 1201 (!) 150/93     Pulse Rate 05/11/19 1201 61     Resp 05/11/19 1201 18     Temp 05/11/19 1201 98.8 F (37.1 C)     Temp Source 05/11/19 1201 Oral     SpO2 05/11/19 1201 99 %     Weight 05/11/19 1213 134 lb (60.8 kg)     Height 05/11/19 1213 _0  (1.626 m)     Head Circumference --      Peak Flow --      Pain Score 05/11/19 1213 6     Pain Loc --      Pain Edu? --      Excl. in [place]? --    Constitutional: Alert and oriented. Well appearing and in no acute distress. Eyes: Conjunctivae are normal. PERRL. EOMI. Head: Atraumatic. Nose: No congestion/rhinnorhea. Mouth/Throat: Mucous membranes are moist.  Oropharynx non-erythematous. Neck: No stridor.  No cervical spine tenderness to palpation. Hematological/Lymphatic/Immunilogical: No cervical lymphadenopathy. Cardiovascular: Normal rate, regular rhythm. Grossly normal heart sounds.  Good peripheral circulation. Respiratory: Normal respiratory effort.  No retractions. Lungs CTAB. Gastrointestinal: Soft and nontender. No distention. No abdominal  bruits. No CVA tenderness. Musculoskeletal: No obvious deformity to the right wrist or ankle.  Neurologic:  Normal speech and language. No gross focal neurologic deficits are appreciated. No gait instability. Skin:  Skin is warm, dry and intact. No rash noted.  Ecchymosis left lower back and right wrist. Psychiatric: Mood and affect are normal. Speech and behavior are normal.  ____________________________________________   LABS (all labs ordered are listed, but only abnormal results are displayed)  Labs Reviewed  URINALYSIS, COMPLETE (UACMP) WITH MICROSCOPIC   ____________________________________________  EKG   ____________________________________________  RADIOLOGY  ED MD interpretation:    Official radiology report(s): DG Cervical Spine 2-3 Views  Result Date: 05/11/2019 CLINICAL DATA:  Neck pain following a fall last night. EXAM: CERVICAL SPINE - 2-3 VIEW COMPARISON:  Cervical spine CT dated 10/31/2018. Single-view chest dated 11/04/2018. FINDINGS: Again demonstrated is interbody and anterior screw and plate fusion at the E5-6 level. Large anterior spurs are again demonstrated at the C3-4 and C4-5 levels with moderate anterior spur formation at the C6-7 level. Mild anterior spur formation at the C3-4 level. Multilevel facet degenerative changes. No prevertebral soft tissue swelling, fracture or subluxation seen. Left subclavian bipolar pacemaker. IMPRESSION: 1. No fracture or subluxation. 2. Stable postoperative and degenerative changes, as described above. Electronically Signed   By: Claudie Revering M.D.   On: 05/11/2019 16:01   DG Lumbar Spine 2-3 Views  Result Date: 05/11/2019 CLINICAL DATA:  Lumbosacral back pain secondary to fall. Fall last night striking night stand. EXAM: LUMBAR SPINE - 2-3 VIEW COMPARISON:  Lumbar spine CT 10/31/2018 FINDINGS: The alignment is maintained. Vertebral body heights are normal. There is no listhesis. The posterior elements are intact. Endplate  spurring at multiple levels with preservation of disc spaces. There is prominent facet hypertrophy at multiple levels. No fracture. Sacroiliac joints are congruent. IMPRESSION: No fracture or subluxation of the lumbar spine. Multilevel spondylosis. Electronically Signed   By: Keith Rake M.D.   On: 05/11/2019 15:36   DG Wrist Complete Right  Result Date: 05/11/2019 CLINICAL DATA:  Fall, pain EXAM: RIGHT WRIST - COMPLETE 3+ VIEW COMPARISON:  None. FINDINGS: There is no evidence of fracture or dislocation. There is no evidence of arthropathy or other focal bone abnormality. Soft tissues are unremarkable. IMPRESSION: No fracture or dislocation  of the right wrist. Electronically Signed   By: Eddie Candle M.D.   On: 05/11/2019 15:45   DG Ankle Complete Right  Result Date: 05/11/2019 CLINICAL DATA:  Right ankle pain after fall. Fall last night striking night stand. EXAM: RIGHT ANKLE - COMPLETE 3+ VIEW COMPARISON:  None. FINDINGS: There is no evidence of fracture, dislocation, or joint effusion. The ankle mortise is preserved with mild degenerative joint space narrowing. There is an Achilles tendon enthesophyte. Soft tissues are unremarkable. IMPRESSION: No fracture or subluxation of the right ankle. Electronically Signed   By: Keith Rake M.D.   On: 05/11/2019 15:34    ____________________________________________   PROCEDURES  Procedure(s) performed (including Critical Care):  Procedures   ____________________________________________   INITIAL IMPRESSION / ASSESSMENT AND PLAN / ED COURSE  As part of my medical decision making, I reviewed the following data within the [place] of right wrist, right ankle and left back pain secondary to a fall at home.  Discussed x-ray findings with patient revealing no acute abnormalities.  Patient physical exam is consistent with muscle skeletal pain and sprain of the right wrist/ankle.  Patient given discharge care  instruction and a prescription for Lidoderm.  Patient placed in a wrist splint and the ankle was Ace wrapped.  Advised to follow-up with PCP as needed.    Maria Horton was evaluated in Emergency Department on 05/11/2019 for the symptoms described in the history of present illness. She was evaluated in the context of the global COVID-19 pandemic, which necessitated consideration that the patient might be at risk for infection with the SARS-CoV-2 virus that causes COVID-19. Institutional protocols and algorithms that pertain to the evaluation of patients at risk for COVID-19 are in a state of rapid change based on information released by regulatory bodies including the CDC and federal and state organizations. These policies and algorithms were followed during the patient's care in the ED.       ____________________________________________   FINAL CLINICAL IMPRESSION(S) / ED DIAGNOSES  Final diagnoses:  Fall in home, initial encounter  Sprain of right wrist, initial encounter  Sprain of right ankle, unspecified ligament, initial encounter     ED Discharge Orders         Ordered    lidocaine (LIDODERM) 5 %  Every 12 hours     05/11/19 1604           Note:  This document was prepared using Dragon voice recognition software and may include unintentional dictation errors.    Sable Feil, PA-C 05/11/19 1612    Lavonia Drafts, MD 05/12/19 [phone]

## 2019-05-11 NOTE — Progress Notes (Signed)
Patient off unit. Care relinquished.

## 2019-05-11 NOTE — ED Notes (Signed)
See triage note  Presents s/p fall last pm  Having pain to back and neck

## 2019-05-12 DIAGNOSIS — I10 Essential (primary) hypertension: Secondary | ICD-10-CM

## 2019-05-12 DIAGNOSIS — R296 Repeated falls: Secondary | ICD-10-CM

## 2019-05-12 DIAGNOSIS — F329 Major depressive disorder, single episode, unspecified: Secondary | ICD-10-CM

## 2019-05-12 DIAGNOSIS — F419 Anxiety disorder, unspecified: Secondary | ICD-10-CM

## 2019-05-12 DIAGNOSIS — W19XXXA Unspecified fall, initial encounter: Secondary | ICD-10-CM

## 2019-05-12 LAB — CBC
HCT: 35.2 % — ABNORMAL LOW (ref 36.0–46.0)
Hemoglobin: 12.2 g/dL (ref 12.0–15.0)
MCH: 31.4 pg (ref 26.0–34.0)
MCHC: 34.7 g/dL (ref 30.0–36.0)
MCV: 90.7 fL (ref 80.0–100.0)
Platelets: 204 10*3/uL (ref 150–400)
RBC: 3.88 MIL/uL (ref 3.87–5.11)
RDW: 14.7 % (ref 11.5–15.5)
WBC: 9.8 10*3/uL (ref 4.0–10.5)
nRBC: 0 % (ref 0.0–0.2)

## 2019-05-12 LAB — BASIC METABOLIC PANEL
Anion gap: 12 (ref 5–15)
BUN: 21 mg/dL (ref 8–23)
CO2: 28 mmol/L (ref 22–32)
Calcium: 9.3 mg/dL (ref 8.9–10.3)
Chloride: 102 mmol/L (ref 98–111)
Creatinine, Ser: 1.19 mg/dL — ABNORMAL HIGH (ref 0.44–1.00)
GFR calc Af Amer: 51 mL/min — ABNORMAL LOW (ref >60)
GFR calc non Af Amer: 44 mL/min — ABNORMAL LOW (ref >60)
Glucose, Bld: 76 mg/dL (ref 70–99)
Potassium: 3.5 mmol/L (ref 3.5–5.1)
Sodium: 142 mmol/L (ref 135–145)

## 2019-05-12 LAB — URINALYSIS, COMPLETE (UACMP) WITH MICROSCOPIC
Bacteria, UA: NONE SEEN
Bilirubin Urine: NEGATIVE
Glucose, UA: NEGATIVE mg/dL
Hgb urine dipstick: NEGATIVE
Ketones, ur: 5 mg/dL — AB
Nitrite: NEGATIVE
Protein, ur: NEGATIVE mg/dL
Specific Gravity, Urine: 1.023 (ref 1.005–1.030)
pH: 6 (ref 5.0–8.0)

## 2019-05-12 LAB — GLUCOSE, CAPILLARY: Glucose-Capillary: 82 mg/dL (ref 70–99)

## 2019-05-12 LAB — SARS CORONAVIRUS 2 (TAT 6-24 HRS): SARS Coronavirus 2: NEGATIVE

## 2019-05-12 MED ORDER — QUETIAPINE FUMARATE 25 MG PO TABS: 25.0000 mg | ORAL_TABLET | Freq: Every day | ORAL | Status: DC

## 2019-05-12 MED ORDER — ENSURE ENLIVE PO LIQD
237.0000 mL | Freq: Two times a day (BID) | ORAL | Status: DC
  Administered 2019-05-13 – 2019-05-16 (×6): 237 mL via ORAL

## 2019-05-12 MED ORDER — ADULT MULTIVITAMIN W/MINERALS CH
1.0000 | ORAL_TABLET | Freq: Every day | ORAL | Status: DC
  Administered 2019-05-13 – 2019-05-16 (×3): 1 via ORAL
  Filled 2019-05-12 (×4): qty 1

## 2019-05-12 MED ORDER — QUETIAPINE FUMARATE 25 MG PO TABS
25.0000 mg | ORAL_TABLET | Freq: Two times a day (BID) | ORAL | Status: DC
  Administered 2019-05-12 – 2019-05-16 (×9): 25 mg via ORAL
  Filled 2019-05-12 (×9): qty 1

## 2019-05-12 NOTE — Consult Note (Signed)
Reason for Consult: tremors/confusion  Requesting Physician: Dr. Manuella Ghazi  CC: Tremors/confusion   HPI: Maria Horton is an 79 y.o. femalewith medical history significant for hypertension, TIA, CKD 3, depression, restless legs and insomnia, with history of dyskinesia/parkinsonism, followed by neurologist, Dr. Manuella Ghazi on Sinemet, started on amantadine on 05/07/2019 who was brought into the emergency room after she had 3 falls in the last 48 hours.  Most of the history is taken from the ER records.  Apparently in spite of parkinsonism patient has not had any falls until being started on the amantadine.  She was supposedly supposed to slowly titrate the dose up but daughter started her on the 100 mg.   Past Medical History:  Diagnosis Date  . Anxiety   . Chronic back pain   . Constipation   . Depression   . Diverticulosis   . Fibromyalgia   . Gastroparesis   . GERD (gastroesophageal reflux disease)   . Hypertension   . Hypoglycemia   . Hypokalemia   . Osteoarthritis   . PONV (postoperative nausea and vomiting)   . Reflux   . Stroke ([place])   . TIA (transient ischemic attack)   . Vascular dementia ([place])   . Vitamin D deficiency     Past Surgical History:  Procedure Laterality Date  . ABDOMINAL HYSTERECTOMY    . ABDOMINOPLASTY    . BREAST BIOPSY Left 2005   bengin, with clip  . BUNIONECTOMY Bilateral   . CHOLECYSTECTOMY    . COLON SURGERY    . FLEXIBLE BRONCHOSCOPY Bilateral 01/11/2018   Procedure: FLEXIBLE BRONCHOSCOPY;  Surgeon: Ottie Glazier, MD;  Location: [facility];  Service: Thoracic;  Laterality: Bilateral;  . LEFT HEART CATH AND CORONARY ANGIOGRAPHY Left 10/26/2017   Procedure: LEFT HEART CATH AND CORONARY ANGIOGRAPHY;  Surgeon: Isaias Cowman, MD;  Location: [facility];  Service: Cardiovascular;  Laterality: Left;  . NECK SURGERY    . PACEMAKER INSERTION N/A 12/14/2016   Procedure: INSERTION PACEMAKER;  Surgeon: Isaias Cowman, MD;  Location: [facility];   Service: Cardiovascular;  Laterality: N/A;  . ROTATOR CUFF REPAIR    . TONSILLECTOMY      Family History  Problem Relation Age of Onset  . Hypertension Mother   . Breast cancer Sister 49    Social History:  reports that she has never smoked. She has never used smokeless tobacco. She reports that she does not drink alcohol or use drugs.  Allergies  Allergen Reactions  . 5-Alpha Reductase Inhibitors   . Ace Inhibitors Other (See Comments)    achy  . Beta Adrenergic Blockers Other (See Comments)    Makes achy/feel sick  . Calcium Channel Blockers Other (See Comments)    Feels achy like sick  . Latex Rash  . Tape Rash    Medications: I have reviewed the patient's current medications.  ROS: Unable to to obtain as confused   Physical Examination: Blood pressure (!) 193/176, pulse 82, temperature 98 F (36.7 C), temperature source Oral, resp. rate 16, height _0  (1.626 m), weight 62.6 kg, SpO2 94 %.    Neurological Examination   Mental Status: Alert, oriented to name only. States she is home  Cranial Nerves: II: Discs flat bilaterally; Visual fields grossly normal, pupils equal, round, reactive to light and accommodation III,IV, VI: ptosis not present, extra-ocular motions intact bilaterally V,VII: smile symmetric, facial light touch sensation normal bilaterally VIII: hearing normal bilaterally IX,X: gag reflex present XI: bilateral shoulder shrug XII: midline tongue  extension Motor: Seen moving all her extremities with increase in the tremor.  Tone and bulk:normal tone throughout; no atrophy noted Sensory: Pinprick and light touch intact throughout, bilaterally Deep Tendon Reflexes: 1+ and symmetric throughout     Laboratory Studies:   Basic Metabolic Panel: Recent Labs  Lab 05/11/19 1752 05/12/19 0547  NA 141 142  K 3.5 3.5  CL 100 102  CO2 29 28  GLUCOSE 67* 76  BUN 18 21  CREATININE 1.20* 1.19*  CALCIUM 9.1 9.3    Liver Function Tests: Recent  Labs  Lab 05/11/19 1752  AST 24  ALT 6  ALKPHOS 42  BILITOT 1.3*  PROT 6.3*  ALBUMIN 3.8   No results for input(s): LIPASE, AMYLASE in the last 168 hours. No results for input(s): AMMONIA in the last 168 hours.  CBC: Recent Labs  Lab 05/11/19 1752 05/12/19 0547  WBC 9.3 9.8  NEUTROABS 6.3  --   HGB 12.4 12.2  HCT 36.2 35.2*  MCV 91.2 90.7  PLT 210 204    Cardiac Enzymes: No results for input(s): CKTOTAL, CKMB, CKMBINDEX, TROPONINI in the last 168 hours.  BNP: Invalid input(s): POCBNP  CBG: Recent Labs  Lab 05/12/19 0602  GLUCAP 63    Microbiology: Results for orders placed or performed during the hospital encounter of 05/11/19  SARS CORONAVIRUS 2 (TAT 6-24 HRS) Nasopharyngeal Nasopharyngeal Swab     Status: None   Collection Time: 05/11/19  9:02 PM   Specimen: Nasopharyngeal Swab  Result Value Ref Range Status   SARS Coronavirus 2 NEGATIVE NEGATIVE Final    Comment: (NOTE) SARS-CoV-2 target nucleic acids are NOT DETECTED. The SARS-CoV-2 RNA is generally detectable in upper and lower respiratory specimens during the acute phase of infection. Negative results do not preclude SARS-CoV-2 infection, do not rule out co-infections with other pathogens, and should not be used as the sole basis for treatment or other patient management decisions. Negative results must be combined with clinical observations, patient history, and epidemiological information. The expected result is Negative. Fact Sheet for Patients: SugarRoll.be Fact Sheet for Healthcare Providers: https://www.woods-mathews.com/ This test is not yet approved or cleared by the [place] FDA and  has been authorized for detection and/or diagnosis of SARS-CoV-2 by FDA under an Emergency Use Authorization (EUA). This EUA will remain  in effect (meaning this test can be used) for the duration of the COVID-19 declaration under Section 56 4(b)(1) of the Act, 21  U.S.C. section 360bbb-3(b)(1), unless the authorization is terminated or revoked sooner. Performed at [facility], [place] [address], [place], [place] [address]     Coagulation Studies: No results for input(s): LABPROT, INR in the last 72 hours.  Urinalysis:  Recent Labs  Lab 05/12/19 0319  COLORURINE AMBER*  LABSPEC 1.023  PHURINE 6.0  GLUCOSEU NEGATIVE  HGBUR NEGATIVE  BILIRUBINUR NEGATIVE  KETONESUR 5*  PROTEINUR NEGATIVE  NITRITE NEGATIVE  LEUKOCYTESUR TRACE*    Lipid Panel:     Component Value Date/Time   CHOL 142 11/15/2016 0652   TRIG 232 (H) 11/15/2016 0652   HDL 31 (L) 11/15/2016 0652   CHOLHDL 4.6 11/15/2016 0652   VLDL 46 (H) 11/15/2016 0652   LDLCALC 65 11/15/2016 0652    HgbA1C:  Lab Results  Component Value Date   HGBA1C 5.4 11/15/2016    Urine Drug Screen:      Component Value Date/Time   LABOPIA NONE DETECTED 11/14/2016 2300   COCAINSCRNUR NONE DETECTED 11/14/2016 2300   LABBENZ NONE  DETECTED 11/14/2016 2300   AMPHETMU NONE DETECTED 11/14/2016 2300   THCU NONE DETECTED 11/14/2016 2300   LABBARB NONE DETECTED 11/14/2016 2300    Alcohol Level: No results for input(s): ETH in the last 168 hours.  Other results: EKG: normal EKG, normal sinus rhythm, unchanged from previous tracings.  Imaging: DG Chest 2 View  Result Date: 05/11/2019 CLINICAL DATA:  Multiple falls including 2 yesterday. EXAM: CHEST - 2 VIEW COMPARISON:  11/04/2018 FINDINGS: Normal sized heart. Clear lungs with normal vascularity. Left subclavian bipolar pacemaker leads in satisfactory position. Thoracic spine degenerative changes. No fracture or pneumothorax seen. IMPRESSION: No acute abnormality. Electronically Signed   By: Claudie Revering M.D.   On: 05/11/2019 17:50   DG Cervical Spine 2-3 Views  Result Date: 05/11/2019 CLINICAL DATA:  Neck pain following a fall last night. EXAM: CERVICAL SPINE - 2-3 VIEW COMPARISON:  Cervical spine CT dated 10/31/2018. Single-view chest  dated 11/04/2018. FINDINGS: Again demonstrated is interbody and anterior screw and plate fusion at the M0-1 level. Large anterior spurs are again demonstrated at the C3-4 and C4-5 levels with moderate anterior spur formation at the C6-7 level. Mild anterior spur formation at the C3-4 level. Multilevel facet degenerative changes. No prevertebral soft tissue swelling, fracture or subluxation seen. Left subclavian bipolar pacemaker. IMPRESSION: 1. No fracture or subluxation. 2. Stable postoperative and degenerative changes, as described above. Electronically Signed   By: Claudie Revering M.D.   On: 05/11/2019 16:01   DG Lumbar Spine 2-3 Views  Result Date: 05/11/2019 CLINICAL DATA:  Lumbosacral back pain secondary to fall. Fall last night striking night stand. EXAM: LUMBAR SPINE - 2-3 VIEW COMPARISON:  Lumbar spine CT 10/31/2018 FINDINGS: The alignment is maintained. Vertebral body heights are normal. There is no listhesis. The posterior elements are intact. Endplate spurring at multiple levels with preservation of disc spaces. There is prominent facet hypertrophy at multiple levels. No fracture. Sacroiliac joints are congruent. IMPRESSION: No fracture or subluxation of the lumbar spine. Multilevel spondylosis. Electronically Signed   By: Keith Rake M.D.   On: 05/11/2019 15:36   DG Wrist Complete Right  Result Date: 05/11/2019 CLINICAL DATA:  Fall, pain EXAM: RIGHT WRIST - COMPLETE 3+ VIEW COMPARISON:  None. FINDINGS: There is no evidence of fracture or dislocation. There is no evidence of arthropathy or other focal bone abnormality. Soft tissues are unremarkable. IMPRESSION: No fracture or dislocation of the right wrist. Electronically Signed   By: Eddie Candle M.D.   On: 05/11/2019 15:45   DG Ankle Complete Right  Result Date: 05/11/2019 CLINICAL DATA:  Right ankle pain after fall. Fall last night striking night stand. EXAM: RIGHT ANKLE - COMPLETE 3+ VIEW COMPARISON:  None. FINDINGS: There is no  evidence of fracture, dislocation, or joint effusion. The ankle mortise is preserved with mild degenerative joint space narrowing. There is an Achilles tendon enthesophyte. Soft tissues are unremarkable. IMPRESSION: No fracture or subluxation of the right ankle. Electronically Signed   By: Keith Rake M.D.   On: 05/11/2019 15:34   CT Head Wo Contrast  Result Date: 05/11/2019 CLINICAL DATA:  Fall EXAM: CT HEAD WITHOUT CONTRAST TECHNIQUE: Contiguous axial images were obtained from the base of the skull through the vertex without intravenous contrast. COMPARISON:  CT head 10/31/2018 FINDINGS: Brain: No evidence of acute infarction, hemorrhage, hydrocephalus, extra-axial collection or mass lesion/mass effect. Symmetric prominence of the ventricles, cisterns and sulci compatible with parenchymal volume loss. Patchy areas of white matter hypoattenuation are most compatible with chronic  microvascular angiopathy. Vascular: Atherosclerotic calcification of the carotid siphons. No hyperdense vessel. Skull: No calvarial fracture or suspicious osseous lesion. No scalp swelling or hematoma. Sinuses/Orbits: Paranasal sinuses and mastoid air cells are predominantly clear. Orbital structures are unremarkable aside from prior lens extractions. Other: None IMPRESSION: 1. No acute intracranial abnormality. No scalp swelling or calvarial fracture. 2. Chronic microvascular angiopathy and parenchymal volume loss. Electronically Signed   By: Lovena Le M.D.   On: 05/11/2019 19:09     Assessment/Plan:  79 y.o. femalewith medical history significant for hypertension, TIA, CKD 3, depression, restless legs and insomnia, with history of dyskinesia/parkinsonism, followed by neurologist, Dr. Manuella Ghazi on Sinemet, started on amantadine on 05/07/2019 who was brought into the emergency room after she had 3 falls in the last 48 hours.  Most of the history is taken from the ER records.  Apparently in spite of parkinsonism patient has not  had any falls until being started on the amantadine.  She was supposedly supposed to slowly titrate the dose up but daughter started her on the 100 mg dose from the get go.    - CTH no acute abnormalities - higher dose of amantadine starting does cause dopaminergic affects such as hypotension and postural instability which could have contributed to the falls.  - Pt started on home sinemet - Current symptoms are related to delirium/anxiety and agitation  - as per daughter she has not slept last night which is likely contributing to current symptoms - stimulation during day with lights, tv - consider atypical antipsychotic such as small dose of serqoel to let her sleep tonight for delirium improvement.   05/12/2019, 10:22 AM

## 2019-05-12 NOTE — Evaluation (Signed)
Occupational Therapy Evaluation Patient Details Name: Maria Horton MRN: [id number] DOB: Jun 13, 1940 Today's Date: 05/12/2019    History of Present Illness Pt. is a 79 y.o. female who was admitted to [facility] with recurrent falls, and Ataxia. PMHx includes: HTN, TIA, Insomnia, Parkinsonism, GERD, Fibromyalgia, Chronic Back Pain, Diverticulosis, Osteoporosis, Anxiety, and Depression.   Clinical Impression   Pt. presents with severe ataxia, weakness, limited activity tolerance, confusion, restlessness, and impaired functional mobility, and history of increased falls recently which limits the ability to complete basic ADL and IADL functioning. Pt. resides at home with her daughter, and receives assistance with all ADLs, and IADLs from her daughter. Pt.'s Daughter was present. Upon arrival pt.'s gown was at the end of the bed. Pt. was assisted with donning a fresh gown. Pt./caregiver education was provided verbally about compensatory strategies for tremors for self-grooming, and self-feeding. Verbally reviewed weighted utensils, and weighted wrist weight options, and proximal forearm stabilization for more distal control during ADL tasks. Pt. Will benefit from OT services for ADL training, continued review of compensatory strategies, A/E training, neuromuscular re-education, and pt./cargiver education about home modification, and DME. Pt. would benefit from SNF level of care upon discharge, with follow-up OT services.   Follow Up Recommendations  SNF    Equipment Recommendations    None recommended by OT   Recommendations for Other Services       Precautions / Restrictions        Mobility Bed Mobility               General bed mobility comments: Deferred  Transfers                 General transfer comment: deferred secondary to safety secondary severe ataxia, reslessness, and tremors    Balance                                           ADL either  performed or assessed with clinical judgement   ADL Overall ADL's : Needs assistance/impaired                                       General ADL Comments: Pt. is maxA with ADLs seondary to severa ataxia, and tremors.     Vision Baseline Vision/History: Wears glasses Wears Glasses: At all times       Perception     Praxis      Pertinent Vitals/Pain Pain Assessment: No/denies pain     Hand Dominance Right   Extremity/Trunk Assessment Upper Extremity Assessment Upper Extremity Assessment: Generalized weakness(Ataxia, and tremors)           Communication Communication Communication: No difficulties   Cognition Arousal/Alertness: Awake/alert Behavior During Therapy: Restless Overall Cognitive Status: Impaired/Different from baseline                                     General Comments       Exercises     Shoulder Instructions      Home Living Family/patient expects to be discharged to:: Private residence Living Arrangements: (daughter) Available Help at Discharge: Family;Available PRN/intermittently Type of Home: House Home Access: Stairs to enter   Entrance Stairs-Rails: None Home Layout: One level  Bathroom Shower/Tub: Producer, television/film/video: Standard     Home Equipment: Environmental consultant - 2 wheels;Walker - 4 wheels;Cane - single point;Bedside commode;Shower seat;Hand held shower head          Prior Functioning/Environment Level of Independence: Needs assistance    ADL's / Homemaking Assistance Needed: Pt's daughter reports that she assists the pt. with basic morning ADLs, meal preparation, and medication management. Pt. used a rollator walker. Increased falls recently.            OT Problem List: Decreased strength;Decreased cognition;Decreased activity tolerance;Decreased coordination      OT Treatment/Interventions: Self-care/ADL training;Patient/family education;Therapeutic exercise;Neuromuscular  education;DME and/or AE instruction;Therapeutic activities    OT Goals(Current goals can be found in the care plan section) Acute Rehab OT Goals OT Goal Formulation: Patient unable to participate in goal setting Potential to Achieve Goals: Fair  OT Frequency: Min 1X/week   Barriers to D/C:            Co-evaluation              AM-PAC OT "6 Clicks" Daily Activity     Outcome Measure Help from another person eating meals?: A Lot Help from another person taking care of personal grooming?: A Lot Help from another person toileting, which includes using toliet, bedpan, or urinal?: Total Help from another person bathing (including washing, rinsing, drying)?: Total Help from another person to put on and taking off regular upper body clothing?: A Lot Help from another person to put on and taking off regular lower body clothing?: Total 6 Click Score: 9   End of Session    Activity Tolerance: Other (comment)(Restessness) Patient left: in bed;with call bell/phone within reach;with family/visitor present  OT Visit Diagnosis: Muscle weakness (generalized) (M62.81);Apraxia (R48.2);Ataxia, unspecified (R27.0)                Time: 7209-4709 OT Time Calculation (min): 17 min Charges:  OT General Charges $OT Visit: 1 Visit OT Evaluation $OT Eval Low Complexity: 1 Low  Olegario Messier, MS, OTR/L  Olegario Messier 05/12/2019, 3:27 PM

## 2019-05-12 NOTE — Progress Notes (Signed)
Initial Nutrition Assessment  RD working remotely.  DOCUMENTATION CODES:   Not applicable  INTERVENTION:  Recommend liberalizing diet to regular.  Provide Ensure Enlive po BID, each supplement provides 350 kcal and 20 grams of protein.  Provide daily MVI.  NUTRITION DIAGNOSIS:   Inadequate oral intake related to decreased appetite as evidenced by meal completion < 50%.  GOAL:   Patient will meet greater than or equal to 90% of their needs  MONITOR:   PO intake, Supplement acceptance, Labs, Weight trends, I & O's  REASON FOR ASSESSMENT:   Malnutrition Screening Tool    ASSESSMENT:   79 year old female with PMHx of vascular dementia, depression, HTN, anxiety, gastroparesis, diverticulosis, vitamin D deficiency, GERD, fibromyalgia admitted with recurrent falls likely from high-dose of amantadine started all at once, parkinsonism.   Attempted to call patient over the phone but she was unable to answer. According to chart patient only ate 35% of her lunch today.   Weight appears to be trending down in chart. Patient was 68 kg on 09/11/2018. She is now 62.6 kg (138 lbs). She has lost 5.4 kg (7.9% body weight) over the past 8 months, which is not significant for time frame.  Medications and labs reviewed.  Unable to determine if patient meets criteria for malnutrition at this time.  NUTRITION - FOCUSED PHYSICAL EXAM:  Unable to complete at this time as RD working remotely.  Diet Order:   Diet Order            Diet Heart Room service appropriate? Yes; Fluid consistency: Thin  Diet effective now             EDUCATION NEEDS:   No education needs have been identified at this time  Skin:  Skin Assessment: Reviewed RN Assessment  Last BM:  Unknown/PTA  Height:   Ht Readings from Last 1 Encounters:  05/11/19 5\' 4"  (1.626 m)   Weight:   Wt Readings from Last 1 Encounters:  05/12/19 62.6 kg   BMI:  Body mass index is 23.69 kg/m.  Estimated Nutritional  Needs:   Kcal:  1600-1800  Protein:  80-90 grams  Fluid:  1.6-1.8 L/day  05/14/19, MS, RD, LDN Pager number available on Amion

## 2019-05-12 NOTE — Progress Notes (Signed)
PT Cancellation Note  Patient Details Name: Maria Horton MRN: [id number] DOB: 07-May-1940   Cancelled Treatment:    Reason Eval/Treat Not Completed: Patient not medically ready(per RN, pt not appropriate for PT due to agitation and delusions. rec PT eval after pt receives seroquel that is pending approval. Will continue to monitor and re-attempt eval once medically ready)   Luretha Murphy. Ilsa Iha, PT, DPT 05/12/19, 2:54 PM

## 2019-05-12 NOTE — Progress Notes (Signed)
1        [place] [facility]   PATIENT NAME: Maria Horton    MR#:  [id number]  DATE OF BIRTH:  [birth year]  SUBJECTIVE:  CHIEF COMPLAINT:   Chief Complaint  Patient presents with  . Fall  Shaking all over her body, very tremulous.  Daughter at bedside REVIEW OF SYSTEMS:  Review of Systems  Constitutional: Negative for diaphoresis, fever, malaise/fatigue and weight loss.  HENT: Negative for ear discharge, ear pain, hearing loss, nosebleeds, sore throat and tinnitus.   Eyes: Negative for blurred vision and pain.  Respiratory: Negative for cough, hemoptysis, shortness of breath and wheezing.   Cardiovascular: Negative for chest pain, palpitations, orthopnea and leg swelling.  Gastrointestinal: Negative for abdominal pain, blood in stool, constipation, diarrhea, heartburn, nausea and vomiting.  Genitourinary: Negative for dysuria, frequency and urgency.  Musculoskeletal: Positive for falls. Negative for back pain and myalgias.  Skin: Negative for itching and rash.  Neurological: Positive for tremors. Negative for dizziness, tingling, focal weakness, seizures, weakness and headaches.  Psychiatric/Behavioral: Negative for depression. The patient is not nervous/anxious.    DRUG ALLERGIES:   Allergies  Allergen Reactions  . 5-Alpha Reductase Inhibitors   . Ace Inhibitors Other (See Comments)    achy  . Beta Adrenergic Blockers Other (See Comments)    Makes achy/feel sick  . Calcium Channel Blockers Other (See Comments)    Feels achy like sick  . Latex Rash  . Tape Rash   VITALS:  Blood pressure 98/71, pulse 78, temperature 97.8 F (36.6 C), temperature source Oral, resp. rate 18, height 5\' 4"  (1.626 m), weight 62.6 kg, SpO2 96 %. PHYSICAL EXAMINATION:  Physical Exam HENT:     Head: Normocephalic and atraumatic.  Eyes:     Conjunctiva/sclera: Conjunctivae normal.     Pupils: Pupils are equal, round, and reactive to light.  Neck:     Thyroid: No thyromegaly.       Trachea: No tracheal deviation.  Cardiovascular:     Rate and Rhythm: Normal rate and regular rhythm.     Heart sounds: Normal heart sounds.  Pulmonary:     Effort: Pulmonary effort is normal. No respiratory distress.     Breath sounds: Normal breath sounds. No wheezing.  Chest:     Chest wall: No tenderness.  Abdominal:     General: Bowel sounds are normal. There is no distension.     Palpations: Abdomen is soft.     Tenderness: There is no abdominal tenderness.  Musculoskeletal:        General: Normal range of motion.     Cervical back: Normal range of motion and neck supple.  Skin:    General: Skin is warm and dry.     Findings: No rash.  Neurological:     Mental Status: She is alert and oriented to person, place, and time.     Cranial Nerves: No cranial nerve deficit.     Motor: Tremor present.    LABORATORY PANEL:  Female CBC Recent Labs  Lab 05/12/19 0547  WBC 9.8  HGB 12.2  HCT 35.2*  PLT 204   ------------------------------------------------------------------------------------------------------------------ Chemistries  Recent Labs  Lab 05/11/19 1752 05/11/19 1752 05/12/19 0547  NA 141   < > 142  K 3.5   < > 3.5  CL 100   < > 102  CO2 29   < > 28  GLUCOSE 67*   < > 76  BUN 18   < > 21  CREATININE 1.20*   < > 1.19*  CALCIUM 9.1   < > 9.3  AST 24  --   --   ALT 6  --   --   ALKPHOS 42  --   --   BILITOT 1.3*  --   --    < > = values in this interval not displayed.   RADIOLOGY:  DG Chest 2 View  Result Date: 05/11/2019 CLINICAL DATA:  Multiple falls including 2 yesterday. EXAM: CHEST - 2 VIEW COMPARISON:  11/04/2018 FINDINGS: Normal sized heart. Clear lungs with normal vascularity. Left subclavian bipolar pacemaker leads in satisfactory position. Thoracic spine degenerative changes. No fracture or pneumothorax seen. IMPRESSION: No acute abnormality. Electronically Signed   By: Claudie Revering M.D.   On: 05/11/2019 17:50   DG Cervical Spine 2-3  Views  Result Date: 05/11/2019 CLINICAL DATA:  Neck pain following a fall last night. EXAM: CERVICAL SPINE - 2-3 VIEW COMPARISON:  Cervical spine CT dated 10/31/2018. Single-view chest dated 11/04/2018. FINDINGS: Again demonstrated is interbody and anterior screw and plate fusion at the E5-6 level. Large anterior spurs are again demonstrated at the C3-4 and C4-5 levels with moderate anterior spur formation at the C6-7 level. Mild anterior spur formation at the C3-4 level. Multilevel facet degenerative changes. No prevertebral soft tissue swelling, fracture or subluxation seen. Left subclavian bipolar pacemaker. IMPRESSION: 1. No fracture or subluxation. 2. Stable postoperative and degenerative changes, as described above. Electronically Signed   By: Claudie Revering M.D.   On: 05/11/2019 16:01   DG Lumbar Spine 2-3 Views  Result Date: 05/11/2019 CLINICAL DATA:  Lumbosacral back pain secondary to fall. Fall last night striking night stand. EXAM: LUMBAR SPINE - 2-3 VIEW COMPARISON:  Lumbar spine CT 10/31/2018 FINDINGS: The alignment is maintained. Vertebral body heights are normal. There is no listhesis. The posterior elements are intact. Endplate spurring at multiple levels with preservation of disc spaces. There is prominent facet hypertrophy at multiple levels. No fracture. Sacroiliac joints are congruent. IMPRESSION: No fracture or subluxation of the lumbar spine. Multilevel spondylosis. Electronically Signed   By: Keith Rake M.D.   On: 05/11/2019 15:36   DG Wrist Complete Right  Result Date: 05/11/2019 CLINICAL DATA:  Fall, pain EXAM: RIGHT WRIST - COMPLETE 3+ VIEW COMPARISON:  None. FINDINGS: There is no evidence of fracture or dislocation. There is no evidence of arthropathy or other focal bone abnormality. Soft tissues are unremarkable. IMPRESSION: No fracture or dislocation of the right wrist. Electronically Signed   By: Eddie Candle M.D.   On: 05/11/2019 15:45   DG Ankle Complete  Right  Result Date: 05/11/2019 CLINICAL DATA:  Right ankle pain after fall. Fall last night striking night stand. EXAM: RIGHT ANKLE - COMPLETE 3+ VIEW COMPARISON:  None. FINDINGS: There is no evidence of fracture, dislocation, or joint effusion. The ankle mortise is preserved with mild degenerative joint space narrowing. There is an Achilles tendon enthesophyte. Soft tissues are unremarkable. IMPRESSION: No fracture or subluxation of the right ankle. Electronically Signed   By: Keith Rake M.D.   On: 05/11/2019 15:34   CT Head Wo Contrast  Result Date: 05/11/2019 CLINICAL DATA:  Fall EXAM: CT HEAD WITHOUT CONTRAST TECHNIQUE: Contiguous axial images were obtained from the base of the skull through the vertex without intravenous contrast. COMPARISON:  CT head 10/31/2018 FINDINGS: Brain: No evidence of acute infarction, hemorrhage, hydrocephalus, extra-axial collection or mass lesion/mass effect. Symmetric prominence of the ventricles, cisterns and sulci compatible with parenchymal volume  loss. Patchy areas of white matter hypoattenuation are most compatible with chronic microvascular angiopathy. Vascular: Atherosclerotic calcification of the carotid siphons. No hyperdense vessel. Skull: No calvarial fracture or suspicious osseous lesion. No scalp swelling or hematoma. Sinuses/Orbits: Paranasal sinuses and mastoid air cells are predominantly clear. Orbital structures are unremarkable aside from prior lens extractions. Other: None IMPRESSION: 1. No acute intracranial abnormality. No scalp swelling or calvarial fracture. 2. Chronic microvascular angiopathy and parenchymal volume loss. Electronically Signed   By: Kreg Shropshire M.D.   On: 05/11/2019 19:09   ASSESSMENT AND PLAN:  Maria Horton is a 79 y.o. female with medical history significant for hypertension, TIA, CKD 3, depression, restless legs and insomnia, with history of dyskinesia/parkinsonism, followed by neurologist, Dr. Sherryll Burger on Sinemet, started  on amantadine on 05/07/2019 who was brought into the emergency room after she had 3 falls in the last 48 hours  Recurrent falls -Likely from high-dose of amantadine started all at once.She was supposedly supposed to slowly titrate the dose up but daughter started her on the 100 mg dose from the get go.  - CT Head shows no acute abnormalities -Appreciate neurology input - higher dose of amantadine starting does cause dopaminergic affects such as hypotension and postural instability which could have contributed to the falls.  -Continue home sinemet - Current symptoms are related to delirium/anxiety and agitation -avoid benzos and will start on Seroquel 25 mg p.o. twice daily for now.  Patient has not slept well last night -PT/OT eval    Parkinsonism (HCC)   Medication adverse effect, initial encounter -Continue Sinemet.  Hold amantadine for now     Polypharmacy -Patient is on clonazepam, Myrbetriq, ropinirole, melatonin, Sinemet as well as diuretic/Dyazide, bupropion, citalopram, hydroxyzine, trazodone, Klonopin -Appreciate neuro input -She may need psychiatry eval     Hypertension -Hold diuretic treatment as this could increase fall risk in the elderly -Continue metoprolol    Anxiety and depression -Continue Celexa and Wellbutrin.  Hold trazodone, hydroxyzine for now    History of TIA (transient ischemic attack) -Continue aspirin, Plavix    Restless leg syndrome -Continue Requip    Hypoglycemia -Resolved.  She is eating and drinking.  We will hold off IV hydration at this time as she is pulling out her lines and telemetry   Status is: Inpatient  Remains inpatient appropriate because:Altered mental status   Dispo: The patient is from: Home              Anticipated d/c is to: Home              Anticipated d/c date is: 2 days              Patient currently is not medically stable to d/c.     DVT prophylaxis: Lovenox Family Communication: Updated patient's daughter  at bedside   All the records are reviewed and case discussed with Care Management/Social Worker. Management plans discussed with the patient, family and they are in agreement.  CODE STATUS: Full Code  TOTAL TIME TAKING CARE OF THIS PATIENT: 35 minutes.   More than 50% of the time was spent in counseling/coordination of care: YES  POSSIBLE D/C IN 2-3 DAYS, DEPENDING ON CLINICAL CONDITION.   Delfino Lovett M.D on 05/12/2019 at 2:40 PM  [facility]   CC: Primary care physician; Gracelyn Nurse, MD  Note: This dictation was prepared with Dragon dictation along with smaller phrase technology. Any transcriptional errors that result from this process are unintentional.

## 2019-05-12 NOTE — Plan of Care (Signed)

## 2019-05-13 ENCOUNTER — Inpatient Hospital Stay
Admit: 2019-05-13 | Discharge: 2019-05-13 | Disposition: A | Discharge status: Home / Self Care | Payer: Medicare Other | Attending: Internal Medicine | Admitting: Internal Medicine

## 2019-05-13 DIAGNOSIS — T50905A Adverse effect of unspecified drugs, medicaments and biological substances, initial encounter: Secondary | ICD-10-CM

## 2019-05-13 LAB — CK: Total CK: 6604 U/L — ABNORMAL HIGH (ref 38–234)

## 2019-05-13 LAB — GLUCOSE, CAPILLARY: Glucose-Capillary: 77 mg/dL (ref 70–99)

## 2019-05-13 MED ORDER — TRAZODONE HCL 100 MG PO TABS
100.0000 mg | ORAL_TABLET | Freq: Every day | ORAL | Status: DC
Start: 2019-05-13 — End: 2019-05-16
  Administered 2019-05-13 – 2019-05-15 (×4): 100 mg via ORAL
  Filled 2019-05-13 (×4): qty 1

## 2019-05-13 MED ORDER — CLONAZEPAM 0.5 MG PO TABS
0.5000 mg | ORAL_TABLET | Freq: Every day | ORAL | Status: DC
  Administered 2019-05-13 – 2019-05-15 (×4): 0.5 mg via ORAL
  Filled 2019-05-13 (×4): qty 1

## 2019-05-13 NOTE — Progress Notes (Addendum)
Patient rolling around in bed/ shaking increased compared to prior assessment. Notified on call provider Webb Silversmith NP for assessment. Waiting for call back.

## 2019-05-13 NOTE — Progress Notes (Addendum)
    BRIEF OVERNIGHT PROGRESS REPORT   SUBJECTIVE: Patient's RN report that patent is unable to sleep since admission and is very restless.  Per patient's daughter at the bedside, symptoms started since her clonazepam and trazodone were discontinued on admission.  Daughter states she has been on this medication for several years and she is concerned that withdrawal of this medication may be causing uncomfortable restlessness, worsening of her tardive dyskinesias, insomnia stiffness and shakiness.  OBJECTIVE: On arrival to the bedside, she was afebrile with blood pressure 132/76 mm Hg and pulse rate 66 beats/min. There were no focal neurological deficits; she was however noted with abnormal movements of the jaw, lips and tongue (tardive dyskinesias) and uncomfortable restlessness (akathisia).  She was also hallucinating and trying to reach for things that are not there.  Daughter stated she has been talking of seeing spiders in the room.  BRIEF PATIENT DESCRIPTION: 79 y.o.femalewith medical history significant forhypertension, TIA,CKD 3,depression, restless legs and insomnia, with history of dyskinesia/parkinsonism, followed by neurologist, Dr. Albertina Parr Sinemet, started on amantadine on 05/07/2019 who was brought into the emergency room after she had 3 falls in the last 48 hours  ASSESSMENT/PLAN: 1.Tardive dyskinesia and Akathisia -patient with worsening abnormal movements of the jaw, lips and tongue and restlessness.  Unclear if worsening symptoms is due to introduction of a neuroleptic or withdrawal of her clonazepam. -Give trial of her home clonazepam -Continue Seroquel -Neurology following  2. Parkinsonism -with history of vascular parkinsonism diagnosed in 2005 - Continue Sinemet - Continue Requip also for RLS - Patient follows with neurology Dr. Sherryll Burger  3. Hallucination -new since starting amantadine which can cause hallucinations in patients with known parkinsonism. - Continue Seroquel  for now  4. Insomnia -Patient's daughter insists on restarting her home Trazodone which is the only medication that works for her and would like to try this to help her with sleep.  Discussed with her risk of polypharmacy she is currently on multiple medications.     Webb Silversmith, DNP, CCRN, FNP-C [facility] Nurse Practitioner

## 2019-05-13 NOTE — Plan of Care (Signed)

## 2019-05-13 NOTE — Consult Note (Signed)
[facility] Face-to-Face Psychiatry Consult   Reason for Consult:  Anxiety Referring Physician:  Dr. Manuella Ghazi Patient Identification: Maria Horton MRN:  [id number] Principal Diagnosis: Recurrent falls Diagnosis:  Principal Problem:   Recurrent falls Active Problems:   Hypertension   Parkinsonism ([place])   Anxiety and depression   History of TIA (transient ischemic attack)   Restless leg syndrome   Medication adverse effect, initial encounter   Polypharmacy   Falls   Hypoglycemia  Total Time spent with patient: 30 minutes  Subjective:   Maria Horton is a 79 y.o. female patient admitted with hypertension, history of CVA x 2, CKD 3, depression, anxiety, parkinson disease, vascular dementia, and restless legs. Consult placed for worsening anxiety " sense of falling " and new onset of hallucinations "seeing spiders". As per her daughter "she was not able to remember who I was yesterday."   HPI:  Maria Horton is a 79 y.o. female with medical history significant for hypertension, TIA, CKD 3, depression, restless legs and insomnia, with history of dyskinesia/parkinsonism, followed by neurologist, Dr. Manuella Ghazi on Sinemet, started on amantadine on 05/07/2019 who was brought into the emergency room after she had 3 falls in the last 48 hours.  Most of the history is taken from the ER records.  Apparently in spite of parkinsonism patient has not had any falls until being started on the amantadine.  She was supposedly supposed to slowly titrate the dose up but daughter started her on the 100 mg dose from the get go.  Most of the history is taken from the daughter at the bedside as patient is disoriented.    ED Course: In the emergency room vitals were within normal limits except for slightly elevated blood pressure of 150/93.  Blood work mostly unremarkable though she was noted to have slight hypoglycemia of 67.  CT head showed chronic microvascular angiopathy.  Trauma work-up with extensive imaging showed no  other acute injury.  Hospitalist consult requested for observation.   During the evaluation patient is observed lying in bed with a near continual upper extremity parkinsonian type tremor. She is alert and oriented x 1, reporting that she is in [facility] (two attempts) second attempt she stated " I am at [facility]. I do not know the date." She was unable to identify the president. She admits to hallucinations " did you just see that person run by here, they went around that corner ? I been seeing spiders some  Pretty big ones too. " Patient denies currently seeing any spiders but then states " there is one right there behind the poster board. But he is a little one. Nurse, mental health and daughter reassured patient that no spiders were there and we were there for her safety. Re-orientation and mental awareness provided. Patient is unable to forward much information due to disorientation however daughter is present and permission is obtained to speak with her. She denies suicidal ideation, homicidal ideations at this time.    Past Psychiatric History: Depression, Anxiety, and Insomnia. Currently under the care of Dr. Leotis Shames at [facility]. She is prescribed citalopram 51m po daily, Wellbutrin xl 3078mpo daily, hydroxyzine 5089mo qhs prn, Klonopin 0.5mg80m qhs, and Trazodone 50-100mg23mqhs. Daughter reports most recent admission to [facility] was confirmed 09/2015 for MDD and suicidal ideation. SHe has two other inpatient admissions. Denies suicide attempts.  She has previously started taking antidepressants in her 40s, 41s celexa and trazodone worked best  for her.   Risk to Self:  no Risk to Others:  no Prior Inpatient Therapy:   x3 for mdd and suicidal ideation with a plan  Prior Outpatient Therapy:  [facility]   Past Medical History:  Past Medical History:  Diagnosis Date  . Anxiety   . Chronic back pain   . Constipation   . Depression   . Diverticulosis   .  Fibromyalgia   . Gastroparesis   . GERD (gastroesophageal reflux disease)   . Hypertension   . Hypoglycemia   . Hypokalemia   . Osteoarthritis   . PONV (postoperative nausea and vomiting)   . Reflux   . Stroke ([place])   . TIA (transient ischemic attack)   . Vascular dementia ([place])   . Vitamin D deficiency     Past Surgical History:  Procedure Laterality Date  . ABDOMINAL HYSTERECTOMY    . ABDOMINOPLASTY    . BREAST BIOPSY Left 2005   bengin, with clip  . BUNIONECTOMY Bilateral   . CHOLECYSTECTOMY    . COLON SURGERY    . FLEXIBLE BRONCHOSCOPY Bilateral 01/11/2018   Procedure: FLEXIBLE BRONCHOSCOPY;  Surgeon: Ottie Glazier, MD;  Location: [facility];  Service: Thoracic;  Laterality: Bilateral;  . LEFT HEART CATH AND CORONARY ANGIOGRAPHY Left 10/26/2017   Procedure: LEFT HEART CATH AND CORONARY ANGIOGRAPHY;  Surgeon: Isaias Cowman, MD;  Location: [place] [facility];  Service: Cardiovascular;  Laterality: Left;  . NECK SURGERY    . PACEMAKER INSERTION N/A 12/14/2016   Procedure: INSERTION PACEMAKER;  Surgeon: Isaias Cowman, MD;  Location: [facility];  Service: Cardiovascular;  Laterality: N/A;  . ROTATOR CUFF REPAIR    . TONSILLECTOMY     Family History:  Family History  Problem Relation Age of Onset  . Hypertension Mother   . Breast cancer Sister 60   Family Psychiatric  History: Per chart review she reports that he son used drugs and her daughter struggles with depression.   Social History:  Social History   Substance and Sexual Activity  Alcohol Use No     Social History   Substance and Sexual Activity  Drug Use No    Social History   Socioeconomic History  . Marital status: Widowed    Spouse name: Not on file  . Number of children: Not on file  . Years of education: Not on file  . Highest education level: Not on file  Occupational History  . Not on file  Tobacco Use  . Smoking status: Never Smoker  . Smokeless tobacco: Never Used  Substance  and Sexual Activity  . Alcohol use: No  . Drug use: No  . Sexual activity: Not on file  Other Topics Concern  . Not on file  Social History Narrative  . Not on file   Social Determinants of Health   Financial Resource Strain:   . Difficulty of Paying Living Expenses:   Food Insecurity:   . Worried About Charity fundraiser in the Last Year:   . Arboriculturist in the Last Year:   Transportation Needs:   . Film/video editor (Medical):   Marland Kitchen Lack of Transportation (Non-Medical):   Physical Activity:   . Days of Exercise per Week:   . Minutes of Exercise per Session:   Stress:   . Feeling of Stress :   Social Connections:   . Frequency of Communication with Friends and Family:   . Frequency of Social Gatherings with Friends and Family:   .  Attends Religious Services:   . Active Member of Clubs or Organizations:   . Attends Archivist Meetings:   Marland Kitchen Marital Status:    Additional Social History:    Allergies:   Allergies  Allergen Reactions  . 5-Alpha Reductase Inhibitors   . Ace Inhibitors Other (See Comments)    achy  . Beta Adrenergic Blockers Other (See Comments)    Makes achy/feel sick  . Calcium Channel Blockers Other (See Comments)    Feels achy like sick  . Latex Rash  . Tape Rash    Labs:  Results for orders placed or performed during the hospital encounter of 05/11/19 (from the past 48 hour(s))  CBC with Differential     Status: None   Collection Time: 05/11/19  5:52 PM  Result Value Ref Range   WBC 9.3 4.0 - 10.5 K/uL   RBC 3.97 3.87 - 5.11 MIL/uL   Hemoglobin 12.4 12.0 - 15.0 g/dL   HCT 36.2 36.0 - 46.0 %   MCV 91.2 80.0 - 100.0 fL   MCH 31.2 26.0 - 34.0 pg   MCHC 34.3 30.0 - 36.0 g/dL   RDW 14.7 11.5 - 15.5 %   Platelets 210 150 - 400 K/uL   nRBC 0.0 0.0 - 0.2 %   Neutrophils Relative % 69 %   Neutro Abs 6.3 1.7 - 7.7 K/uL   Lymphocytes Relative 22 %   Lymphs Abs 2.1 0.7 - 4.0 K/uL   Monocytes Relative 9 %   Monocytes Absolute  0.8 0.1 - 1.0 K/uL   Eosinophils Relative 0 %   Eosinophils Absolute 0.0 0.0 - 0.5 K/uL   Basophils Relative 0 %   Basophils Absolute 0.0 0.0 - 0.1 K/uL   Immature Granulocytes 0 %   Abs Immature Granulocytes 0.04 0.00 - 0.07 K/uL    Comment: Performed at [facility], [address] [place], [place] [address]  Comprehensive metabolic panel     Status: Abnormal   Collection Time: 05/11/19  5:52 PM  Result Value Ref Range   Sodium 141 135 - 145 mmol/L   Potassium 3.5 3.5 - 5.1 mmol/L   Chloride 100 98 - 111 mmol/L   CO2 29 22 - 32 mmol/L   Glucose, Bld 67 (L) 70 - 99 mg/dL    Comment: Glucose reference range applies only to samples taken after fasting for at least 8 hours.   BUN 18 8 - 23 mg/dL   Creatinine, Ser 1.20 (H) 0.44 - 1.00 mg/dL   Calcium 9.1 8.9 - 10.3 mg/dL   Total Protein 6.3 (L) 6.5 - 8.1 g/dL   Albumin 3.8 3.5 - 5.0 g/dL   AST 24 15 - 41 U/L   ALT 6 0 - 44 U/L   Alkaline Phosphatase 42 38 - 126 U/L   Total Bilirubin 1.3 (H) 0.3 - 1.2 mg/dL   GFR calc non Af Amer 44 (L) >60 mL/min   GFR calc Af Amer 51 (L) >60 mL/min   Anion gap 12 5 - 15    Comment: Performed at [facility], [place], [place] [address]  Troponin I (High Sensitivity)     Status: None   Collection Time: 05/11/19  5:52 PM  Result Value Ref Range   Troponin I (High Sensitivity) 5 <18 ng/L    Comment: (NOTE) Elevated high sensitivity troponin I (hsTnI) values and significant  changes across serial measurements may suggest ACS but many other  chronic and acute conditions  are known to elevate hsTnI results.  Refer to the "Links" section for chest pain algorithms and additional  guidance. Performed at [facility], [address] [place], [place] [address]   TSH     Status: None   Collection Time: 05/11/19  5:52 PM  Result Value Ref Range   TSH 3.362 0.350 - 4.500 uIU/mL    Comment: Performed by a 3rd Generation assay with a functional sensitivity of  <=0.01 uIU/mL. Performed at [facility], [place], [place] [address]   SARS CORONAVIRUS 2 (TAT 6-24 HRS) Nasopharyngeal Nasopharyngeal Swab     Status: None   Collection Time: 05/11/19  9:02 PM   Specimen: Nasopharyngeal Swab  Result Value Ref Range   SARS Coronavirus 2 NEGATIVE NEGATIVE    Comment: (NOTE) SARS-CoV-2 target nucleic acids are NOT DETECTED. The SARS-CoV-2 RNA is generally detectable in upper and lower respiratory specimens during the acute phase of infection. Negative results do not preclude SARS-CoV-2 infection, do not rule out co-infections with other pathogens, and should not be used as the sole basis for treatment or other patient management decisions. Negative results must be combined with clinical observations, patient history, and epidemiological information. The expected result is Negative. Fact Sheet for Patients: SugarRoll.be Fact Sheet for Healthcare Providers: https://www.woods-mathews.com/ This test is not yet approved or cleared by the [place] FDA and  has been authorized for detection and/or diagnosis of SARS-CoV-2 by FDA under an Emergency Use Authorization (EUA). This EUA will remain  in effect (meaning this test can be used) for the duration of the COVID-19 declaration under Section 56 4(b)(1) of the Act, 21 U.S.C. section 360bbb-3(b)(1), unless the authorization is terminated or revoked sooner. Performed at [facility], [place] [address], [place], [place] [address]   Troponin I (High Sensitivity)     Status: None   Collection Time: 05/11/19  9:21 PM  Result Value Ref Range   Troponin I (High Sensitivity) 6 <18 ng/L    Comment: (NOTE) Elevated high sensitivity troponin I (hsTnI) values and significant  changes across serial measurements may suggest ACS but many other  chronic and acute conditions are known to elevate hsTnI results.  Refer to the "Links" section for  chest pain algorithms and additional  guidance. Performed at [facility] [address] [place], [place] [address]   Urinalysis, Complete w Microscopic     Status: Abnormal   Collection Time: 05/12/19  3:19 AM  Result Value Ref Range   Color, Urine AMBER (A) YELLOW    Comment: BIOCHEMICALS MAY BE AFFECTED BY COLOR   APPearance CLOUDY (A) CLEAR   Specific Gravity, Urine 1.023 1.005 - 1.030   pH 6.0 5.0 - 8.0   Glucose, UA NEGATIVE NEGATIVE mg/dL   Hgb urine dipstick NEGATIVE NEGATIVE   Bilirubin Urine NEGATIVE NEGATIVE   Ketones, ur 5 (A) NEGATIVE mg/dL   Protein, ur NEGATIVE NEGATIVE mg/dL   Nitrite NEGATIVE NEGATIVE   Leukocytes,Ua TRACE (A) NEGATIVE   RBC / HPF 0-5 0 - 5 RBC/hpf   WBC, UA 6-10 0 - 5 WBC/hpf   Bacteria, UA NONE SEEN NONE SEEN   Squamous Epithelial / LPF 0-5 0 - 5   Mucus PRESENT    Hyaline Casts, UA PRESENT     Comment: Performed at [facility], [address] [place], [place] [address]  Basic metabolic panel     Status: Abnormal   Collection Time: 05/12/19  5:47 AM  Result  Value Ref Range   Sodium 142 135 - 145 mmol/L   Potassium 3.5 3.5 - 5.1 mmol/L   Chloride 102 98 - 111 mmol/L   CO2 28 22 - 32 mmol/L   Glucose, Bld 76 70 - 99 mg/dL    Comment: Glucose reference range applies only to samples taken after fasting for at least 8 hours.   BUN 21 8 - 23 mg/dL   Creatinine, Ser 1.19 (H) 0.44 - 1.00 mg/dL   Calcium 9.3 8.9 - 10.3 mg/dL   GFR calc non Af Amer 44 (L) >60 mL/min   GFR calc Af Amer 51 (L) >60 mL/min   Anion gap 12 5 - 15    Comment: Performed at [facility], [address] [place], [place] [address]  CBC     Status: Abnormal   Collection Time: 05/12/19  5:47 AM  Result Value Ref Range   WBC 9.8 4.0 - 10.5 K/uL   RBC 3.88 3.87 - 5.11 MIL/uL   Hemoglobin 12.2 12.0 - 15.0 g/dL   HCT 35.2 (L) 36.0 - 46.0 %   MCV 90.7 80.0 - 100.0 fL   MCH 31.4 26.0 - 34.0 pg   MCHC 34.7 30.0 - 36.0 g/dL   RDW 14.7 11.5 -  15.5 %   Platelets 204 150 - 400 K/uL   nRBC 0.0 0.0 - 0.2 %    Comment: Performed at [facility], [address] [place], [place] [address]  Glucose, capillary     Status: None   Collection Time: 05/12/19  6:02 AM  Result Value Ref Range   Glucose-Capillary 82 70 - 99 mg/dL    Comment: Glucose reference range applies only to samples taken after fasting for at least 8 hours.  Glucose, capillary     Status: None   Collection Time: 05/13/19  5:45 AM  Result Value Ref Range   Glucose-Capillary 77 70 - 99 mg/dL    Comment: Glucose reference range applies only to samples taken after fasting for at least 8 hours.   Comment 1 Notify RN     Current Facility-Administered Medications  Medication Dose Route Frequency Provider Last Rate Last Admin  . acetaminophen (TYLENOL) tablet 650 mg  650 mg Oral Q6H PRN Athena Masse, MD       Or  . acetaminophen (TYLENOL) suppository 650 mg  650 mg Rectal Q6H PRN Athena Masse, MD      . aspirin EC tablet 81 mg  81 mg Oral Daily Athena Masse, MD   81 mg at 05/13/19 1015  . bisacodyl (DULCOLAX) EC tablet 5 mg  5 mg Oral Daily PRN Athena Masse, MD      . carbidopa-levodopa (SINEMET CR) 50-200 MG per tablet controlled release 1 tablet  1 tablet Oral QHS Athena Masse, MD   1 tablet at 05/12/19 2208  . carbidopa-levodopa (SINEMET IR) 25-100 MG per tablet immediate release 1 tablet  1 tablet Oral TID Athena Masse, MD   1 tablet at 05/13/19 1014  . clonazePAM (KLONOPIN) tablet 0.5 mg  0.5 mg Oral QHS Lang Snow, NP   0.5 mg at 05/13/19 0151  . clopidogrel (PLAVIX) tablet 75 mg  75 mg Oral Daily Athena Masse, MD   75 mg at 05/13/19 1015  . enoxaparin (LOVENOX) injection 40 mg  40 mg Subcutaneous Q24H Judd Gaudier V, MD   40 mg at 05/13/19 1010  . feeding supplement (ENSURE ENLIVE) (ENSURE ENLIVE) liquid  237 mL  237 mL Oral BID BM Max Sane, MD   237 mL at 05/13/19 1014  . lidocaine (LIDODERM) 5 % 1 patch  1 patch  Transdermal Q24H Sable Feil, PA-C   1 patch at 05/12/19 1552  . melatonin tablet 10 mg  10 mg Oral QHS Athena Masse, MD   10 mg at 05/12/19 2207  . metoprolol succinate (TOPROL-XL) 24 hr tablet 12.5 mg  12.5 mg Oral Daily Judd Gaudier V, MD   12.5 mg at 05/13/19 1015  . mirabegron ER (MYRBETRIQ) tablet 50 mg  50 mg Oral QHS Athena Masse, MD   50 mg at 05/12/19 2207  . multivitamin with minerals tablet 1 tablet  1 tablet Oral Daily Max Sane, MD   1 tablet at 05/13/19 1015  . ondansetron (ZOFRAN) tablet 4 mg  4 mg Oral Q6H PRN Athena Masse, MD       Or  . ondansetron [facility]) injection 4 mg  4 mg Intravenous Q6H PRN Athena Masse, MD      . pravastatin (PRAVACHOL) tablet 40 mg  40 mg Oral QPM Athena Masse, MD   40 mg at 05/12/19 1731  . QUEtiapine (SEROQUEL) tablet 25 mg  25 mg Oral BID Max Sane, MD   25 mg at 05/13/19 1017  . rOPINIRole (REQUIP) tablet 1 mg  1 mg Oral QHS Athena Masse, MD   1 mg at 05/12/19 2207  . sodium chloride flush (NS) 0.9 % injection 3 mL  3 mL Intravenous Q12H Athena Masse, MD   3 mL at 05/13/19 1019  . traZODone (DESYREL) tablet 100 mg  100 mg Oral QHS Lang Snow, NP   100 mg at 05/13/19 0151    Musculoskeletal: Strength & Muscle Tone: within normal limits Gait & Station: normal Patient leans: N/A  Psychiatric Specialty Exam: Physical Exam  Review of Systems  Blood pressure (!) 108/93, pulse 63, temperature (!) 97.3 F (36.3 C), temperature source Oral, resp. rate 18, height _0  (1.626 m), weight 59.4 kg, SpO2 99 %.Body mass index is 22.49 kg/m.  General Appearance: Fairly Groomed  Eye Contact:  Fair  Speech:  Clear and Coherent and broken speech, garbeld and some perservation noted.   Volume:  Normal  Mood:  Euthymic  Affect:  Congruent  Thought Process:  Coherent, Linear and Descriptions of Associations: Intact  Orientation:  Other:  oriented to self only.   Thought Content:  Logical, Delusions and  Hallucinations: Visual  Suicidal Thoughts:  No  Homicidal Thoughts:  No  Memory:  Immediate;   Poor Recent;   Fair Remote;   Fair  Judgement:  Poor  Insight:  Shallow  Psychomotor Activity:  Increased and Tremor  Concentration:  Concentration: Fair and Attention Span: Poor  Recall:  Poor  Fund of Knowledge:  Fair  Language:  Fair  Akathisia:  No  Handed:  Right  AIMS (if indicated):     Assets:  Communication Skills Desire for Improvement Financial Resources/Insurance Housing Leisure Time Physical Health Social Support Vocational/Educational  ADL's:  Impaired due to excitatory tremors  Cognition:  Impaired,  Moderate  Sleep:        Treatment Plan Summary: Plan Will start very low dose of antipscyhotic as patient is already exhibiting worsening parkinson disease and dementia. Patient is also at high risk for delirium due to her multiple risk factors Will start second generation anti-psychotic to reduce likilehood of EPS and QTc prolongation.  Will start seroquel 78m po BID. Duaghter was concerned about recent sense of falling backwards. Discussed with her worsening of parkinsonans disease and postural instabiilty , in addition to the symmtrel causing worsening s/e. Will recommend discontinuing SYmmtrel entirely and considering another agent (neuro recommendations). Agents such as pimavanserin is another option f hallucinations or psychosis persist. These findings are also common in a person with dementia and parkinsons disease.  Will continue with other home medications at this time to include Klonopin 0.554m Per chart review patient has a history of falls dating back to one year ago that has recently become worsen since starting symmetrel. Writer also discussed patient clinical findings of amber urine at the bedside. U/A results present, no additional labs or imaging available at this time to explain the cause. Patient did complain of back pain in the ED s/p fall x 3.   Disposition:  No evidence of imminent risk to self or others at present.   Patient does not meet criteria for psychiatric inpatient admission. Supportive therapy provided about ongoing stressors. Please reconsult if needed or if symptoms persist. Patient symptoms appear to be consistent with delirium and progressive parkinsonian disease that was exacerbated by adding a dopaminergic agent (SYmmetrel). Daughter denies history of hallucinations or psychosis prior to this admission.   TaSuella BroadFNP 05/13/2019 12:50 PM

## 2019-05-13 NOTE — Progress Notes (Signed)
Subjective: As per daughter hallucinations last night and didn't sleep. Clonopin started which patient was on prior to admission.    Past Medical History:  Diagnosis Date  . Anxiety   . Chronic back pain   . Constipation   . Depression   . Diverticulosis   . Fibromyalgia   . Gastroparesis   . GERD (gastroesophageal reflux disease)   . Hypertension   . Hypoglycemia   . Hypokalemia   . Osteoarthritis   . PONV (postoperative nausea and vomiting)   . Reflux   . Stroke ([place])   . TIA (transient ischemic attack)   . Vascular dementia ([place])   . Vitamin D deficiency     Past Surgical History:  Procedure Laterality Date  . ABDOMINAL HYSTERECTOMY    . ABDOMINOPLASTY    . BREAST BIOPSY Left 2005   bengin, with clip  . BUNIONECTOMY Bilateral   . CHOLECYSTECTOMY    . COLON SURGERY    . FLEXIBLE BRONCHOSCOPY Bilateral 01/11/2018   Procedure: FLEXIBLE BRONCHOSCOPY;  Surgeon: Ottie Glazier, MD;  Location: [facility];  Service: Thoracic;  Laterality: Bilateral;  . LEFT HEART CATH AND CORONARY ANGIOGRAPHY Left 10/26/2017   Procedure: LEFT HEART CATH AND CORONARY ANGIOGRAPHY;  Surgeon: Isaias Cowman, MD;  Location: [facility];  Service: Cardiovascular;  Laterality: Left;  . NECK SURGERY    . PACEMAKER INSERTION N/A 12/14/2016   Procedure: INSERTION PACEMAKER;  Surgeon: Isaias Cowman, MD;  Location: [facility];  Service: Cardiovascular;  Laterality: N/A;  . ROTATOR CUFF REPAIR    . TONSILLECTOMY      Family History  Problem Relation Age of Onset  . Hypertension Mother   . Breast cancer Sister 15    Social History:  reports that she has never smoked. She has never used smokeless tobacco. She reports that she does not drink alcohol or use drugs.  Allergies  Allergen Reactions  . 5-Alpha Reductase Inhibitors   . Ace Inhibitors Other (See Comments)    achy  . Beta Adrenergic Blockers Other (See Comments)    Makes achy/feel sick  . Calcium Channel Blockers Other  (See Comments)    Feels achy like sick  . Latex Rash  . Tape Rash    Medications: I have reviewed the patient's current medications.  ROS: Unable to to obtain as confused   Physical Examination: Blood pressure (!) 108/93, pulse 63, temperature (!) 97.3 F (36.3 C), temperature source Oral, resp. rate 18, height _0  (1.626 m), weight 59.4 kg, SpO2 99 %.    Neurological Examination   Mental Status: Alert, oriented to name only. States she is home  Cranial Nerves: II: Discs flat bilaterally; Visual fields grossly normal, pupils equal, round, reactive to light and accommodation III,IV, VI: ptosis not present, extra-ocular motions intact bilaterally V,VII: smile symmetric, facial light touch sensation normal bilaterally VIII: hearing normal bilaterally IX,X: gag reflex present XI: bilateral shoulder shrug XII: midline tongue extension Motor: Seen moving all her extremities with increase in the tremor.  Tone and bulk:normal tone throughout; no atrophy noted Sensory: Pinprick and light touch intact throughout, bilaterally Deep Tendon Reflexes: 1+ and symmetric throughout     Laboratory Studies:   Basic Metabolic Panel: Recent Labs  Lab 05/11/19 1752 05/12/19 0547  NA 141 142  K 3.5 3.5  CL 100 102  CO2 29 28  GLUCOSE 67* 76  BUN 18 21  CREATININE 1.20* 1.19*  CALCIUM 9.1 9.3    Liver Function Tests: Recent Labs  Lab 05/11/19 1752  AST 24  ALT 6  ALKPHOS 42  BILITOT 1.3*  PROT 6.3*  ALBUMIN 3.8   No results for input(s): LIPASE, AMYLASE in the last 168 hours. No results for input(s): AMMONIA in the last 168 hours.  CBC: Recent Labs  Lab 05/11/19 1752 05/12/19 0547  WBC 9.3 9.8  NEUTROABS 6.3  --   HGB 12.4 12.2  HCT 36.2 35.2*  MCV 91.2 90.7  PLT 210 204    Cardiac Enzymes: No results for input(s): CKTOTAL, CKMB, CKMBINDEX, TROPONINI in the last 168 hours.  BNP: Invalid input(s): POCBNP  CBG: Recent Labs  Lab 05/12/19 0602  05/13/19 0545  GLUCAP 82 68    Microbiology: Results for orders placed or performed during the hospital encounter of 05/11/19  SARS CORONAVIRUS 2 (TAT 6-24 HRS) Nasopharyngeal Nasopharyngeal Swab     Status: None   Collection Time: 05/11/19  9:02 PM   Specimen: Nasopharyngeal Swab  Result Value Ref Range Status   SARS Coronavirus 2 NEGATIVE NEGATIVE Final    Comment: (NOTE) SARS-CoV-2 target nucleic acids are NOT DETECTED. The SARS-CoV-2 RNA is generally detectable in upper and lower respiratory specimens during the acute phase of infection. Negative results do not preclude SARS-CoV-2 infection, do not rule out co-infections with other pathogens, and should not be used as the sole basis for treatment or other patient management decisions. Negative results must be combined with clinical observations, patient history, and epidemiological information. The expected result is Negative. Fact Sheet for Patients: SugarRoll.be Fact Sheet for Healthcare Providers: https://www.woods-mathews.com/ This test is not yet approved or cleared by the [place] FDA and  has been authorized for detection and/or diagnosis of SARS-CoV-2 by FDA under an Emergency Use Authorization (EUA). This EUA will remain  in effect (meaning this test can be used) for the duration of the COVID-19 declaration under Section 56 4(b)(1) of the Act, 21 U.S.C. section 360bbb-3(b)(1), unless the authorization is terminated or revoked sooner. Performed at [facility], [place] [place], [place] [address]     Coagulation Studies: No results for input(s): LABPROT, INR in the last 72 hours.  Urinalysis:  Recent Labs  Lab 05/12/19 0319  COLORURINE AMBER*  LABSPEC 1.023  PHURINE 6.0  GLUCOSEU NEGATIVE  HGBUR NEGATIVE  BILIRUBINUR NEGATIVE  KETONESUR 5*  PROTEINUR NEGATIVE  NITRITE NEGATIVE  LEUKOCYTESUR TRACE*    Lipid Panel:     Component Value  Date/Time   CHOL 142 11/15/2016 0652   TRIG 232 (H) 11/15/2016 0652   HDL 31 (L) 11/15/2016 0652   CHOLHDL 4.6 11/15/2016 0652   VLDL 46 (H) 11/15/2016 0652   LDLCALC 65 11/15/2016 0652    HgbA1C:  Lab Results  Component Value Date   HGBA1C 5.4 11/15/2016    Urine Drug Screen:      Component Value Date/Time   LABOPIA NONE DETECTED 11/14/2016 2300   COCAINSCRNUR NONE DETECTED 11/14/2016 2300   LABBENZ NONE DETECTED 11/14/2016 2300   AMPHETMU NONE DETECTED 11/14/2016 2300   THCU NONE DETECTED 11/14/2016 2300   LABBARB NONE DETECTED 11/14/2016 2300    Alcohol Level: No results for input(s): ETH in the last 168 hours.  Other results: EKG: normal EKG, normal sinus rhythm, unchanged from previous tracings.  Imaging: DG Chest 2 View  Result Date: 05/11/2019 CLINICAL DATA:  Multiple falls including 2 yesterday. EXAM: CHEST - 2 VIEW COMPARISON:  11/04/2018 FINDINGS: Normal sized heart. Clear lungs with normal vascularity. Left subclavian bipolar pacemaker leads in  satisfactory position. Thoracic spine degenerative changes. No fracture or pneumothorax seen. IMPRESSION: No acute abnormality. Electronically Signed   By: Claudie Revering M.D.   On: 05/11/2019 17:50   DG Cervical Spine 2-3 Views  Result Date: 05/11/2019 CLINICAL DATA:  Neck pain following a fall last night. EXAM: CERVICAL SPINE - 2-3 VIEW COMPARISON:  Cervical spine CT dated 10/31/2018. Single-view chest dated 11/04/2018. FINDINGS: Again demonstrated is interbody and anterior screw and plate fusion at the Y7-7 level. Large anterior spurs are again demonstrated at the C3-4 and C4-5 levels with moderate anterior spur formation at the C6-7 level. Mild anterior spur formation at the C3-4 level. Multilevel facet degenerative changes. No prevertebral soft tissue swelling, fracture or subluxation seen. Left subclavian bipolar pacemaker. IMPRESSION: 1. No fracture or subluxation. 2. Stable postoperative and degenerative changes, as  described above. Electronically Signed   By: Claudie Revering M.D.   On: 05/11/2019 16:01   DG Lumbar Spine 2-3 Views  Result Date: 05/11/2019 CLINICAL DATA:  Lumbosacral back pain secondary to fall. Fall last night striking night stand. EXAM: LUMBAR SPINE - 2-3 VIEW COMPARISON:  Lumbar spine CT 10/31/2018 FINDINGS: The alignment is maintained. Vertebral body heights are normal. There is no listhesis. The posterior elements are intact. Endplate spurring at multiple levels with preservation of disc spaces. There is prominent facet hypertrophy at multiple levels. No fracture. Sacroiliac joints are congruent. IMPRESSION: No fracture or subluxation of the lumbar spine. Multilevel spondylosis. Electronically Signed   By: Keith Rake M.D.   On: 05/11/2019 15:36   DG Wrist Complete Right  Result Date: 05/11/2019 CLINICAL DATA:  Fall, pain EXAM: RIGHT WRIST - COMPLETE 3+ VIEW COMPARISON:  None. FINDINGS: There is no evidence of fracture or dislocation. There is no evidence of arthropathy or other focal bone abnormality. Soft tissues are unremarkable. IMPRESSION: No fracture or dislocation of the right wrist. Electronically Signed   By: Eddie Candle M.D.   On: 05/11/2019 15:45   DG Ankle Complete Right  Result Date: 05/11/2019 CLINICAL DATA:  Right ankle pain after fall. Fall last night striking night stand. EXAM: RIGHT ANKLE - COMPLETE 3+ VIEW COMPARISON:  None. FINDINGS: There is no evidence of fracture, dislocation, or joint effusion. The ankle mortise is preserved with mild degenerative joint space narrowing. There is an Achilles tendon enthesophyte. Soft tissues are unremarkable. IMPRESSION: No fracture or subluxation of the right ankle. Electronically Signed   By: Keith Rake M.D.   On: 05/11/2019 15:34   CT Head Wo Contrast  Result Date: 05/11/2019 CLINICAL DATA:  Fall EXAM: CT HEAD WITHOUT CONTRAST TECHNIQUE: Contiguous axial images were obtained from the base of the skull through the vertex  without intravenous contrast. COMPARISON:  CT head 10/31/2018 FINDINGS: Brain: No evidence of acute infarction, hemorrhage, hydrocephalus, extra-axial collection or mass lesion/mass effect. Symmetric prominence of the ventricles, cisterns and sulci compatible with parenchymal volume loss. Patchy areas of white matter hypoattenuation are most compatible with chronic microvascular angiopathy. Vascular: Atherosclerotic calcification of the carotid siphons. No hyperdense vessel. Skull: No calvarial fracture or suspicious osseous lesion. No scalp swelling or hematoma. Sinuses/Orbits: Paranasal sinuses and mastoid air cells are predominantly clear. Orbital structures are unremarkable aside from prior lens extractions. Other: None IMPRESSION: 1. No acute intracranial abnormality. No scalp swelling or calvarial fracture. 2. Chronic microvascular angiopathy and parenchymal volume loss. Electronically Signed   By: Lovena Le M.D.   On: 05/11/2019 19:09     Assessment/Plan:  79 y.o. femalewith medical history significant for  hypertension, TIA, CKD 3, depression, restless legs and insomnia, with history of dyskinesia/parkinsonism, followed by neurologist, Dr. Manuella Ghazi on Sinemet, started on amantadine on 05/07/2019 who was brought into the emergency room after she had 3 falls in the last 48 hours.  Most of the history is taken from the ER records.  Apparently in spite of parkinsonism patient has not had any falls until being started on the amantadine.  She was supposedly supposed to slowly titrate the dose up but daughter started her on the 100 mg dose from the get go.    - CTH no acute abnormalities - higher dose of amantadine starting does cause dopaminergic affects such as hypotension and postural instability which could have contributed to the falls.  - Pt started on home sinemet - Current symptoms are related to delirium/anxiety and agitation  - This is likely hyperactive delirium associated with  halucinations/anxiety - clonopin restartedRufina Falco) appreciated as knows patient prior.   - Agree with requip - received seroquel. Don't want to increase it as patient is on clonopin and also trial of trazodone at night.  Polypharmacy likely contributing to these events - as per daughter she has not slept last 2 night - stimulation during day with lights, tv  05/13/2019, 12:21 PM

## 2019-05-13 NOTE — Progress Notes (Signed)
1        [facility] [place] NAME: Maria Horton    MR#:  [id number]  DATE OF BIRTH:  Sep 13, 1940  SUBJECTIVE:  CHIEF COMPLAINT:   Chief Complaint  Patient presents with  . Fall  Hallucination +, did not sleep well last night.  Daughter at bedside REVIEW OF SYSTEMS:  Review of Systems  Constitutional: Negative for diaphoresis, fever, malaise/fatigue and weight loss.  HENT: Negative for ear discharge, ear pain, hearing loss, nosebleeds, sore throat and tinnitus.   Eyes: Negative for blurred vision and pain.  Respiratory: Negative for cough, hemoptysis, shortness of breath and wheezing.   Cardiovascular: Negative for chest pain, palpitations, orthopnea and leg swelling.  Gastrointestinal: Negative for abdominal pain, blood in stool, constipation, diarrhea, heartburn, nausea and vomiting.  Genitourinary: Negative for dysuria, frequency and urgency.  Musculoskeletal: Positive for falls. Negative for back pain and myalgias.  Skin: Negative for itching and rash.  Neurological: Positive for tremors. Negative for dizziness, tingling, focal weakness, seizures, weakness and headaches.  Psychiatric/Behavioral: Positive for hallucinations. Negative for depression. The patient is nervous/anxious.    DRUG ALLERGIES:   Allergies  Allergen Reactions  . 5-Alpha Reductase Inhibitors   . Ace Inhibitors Other (See Comments)    achy  . Beta Adrenergic Blockers Other (See Comments)    Makes achy/feel sick  . Calcium Channel Blockers Other (See Comments)    Feels achy like sick  . Latex Rash  . Tape Rash   VITALS:  Blood pressure (!) 108/93, pulse 63, temperature (!) 97.3 F (36.3 C), temperature source Oral, resp. rate 18, height 5\' 4"  (1.626 m), weight 59.4 kg, SpO2 99 %. PHYSICAL EXAMINATION:  Physical Exam HENT:     Head: Normocephalic and atraumatic.  Eyes:     Conjunctiva/sclera: Conjunctivae normal.     Pupils: Pupils are equal, round, and reactive to light.   Neck:     Thyroid: No thyromegaly.     Trachea: No tracheal deviation.  Cardiovascular:     Rate and Rhythm: Normal rate and regular rhythm.     Heart sounds: Normal heart sounds.  Pulmonary:     Effort: Pulmonary effort is normal. No respiratory distress.     Breath sounds: Normal breath sounds. No wheezing.  Chest:     Chest wall: No tenderness.  Abdominal:     General: Bowel sounds are normal. There is no distension.     Palpations: Abdomen is soft.     Tenderness: There is no abdominal tenderness.  Musculoskeletal:        General: Normal range of motion.     Cervical back: Normal range of motion and neck supple.  Skin:    General: Skin is warm and dry.     Findings: No rash.  Neurological:     Mental Status: She is alert and oriented to person, place, and time.     Cranial Nerves: No cranial nerve deficit.     Motor: Tremor present.     Comments: Tardive dyskinesia  Psychiatric:        Attention and Perception: She perceives visual hallucinations.    LABORATORY PANEL:  Female CBC Recent Labs  Lab 05/12/19 0547  WBC 9.8  HGB 12.2  HCT 35.2*  PLT 204   ------------------------------------------------------------------------------------------------------------------ Chemistries  Recent Labs  Lab 05/11/19 1752 05/11/19 1752 05/12/19 0547  NA 141   < > 142  K 3.5   < > 3.5  CL 100   < >  102  CO2 29   < > 28  GLUCOSE 67*   < > 76  BUN 18   < > 21  CREATININE 1.20*   < > 1.19*  CALCIUM 9.1   < > 9.3  AST 24  --   --   ALT 6  --   --   ALKPHOS 42  --   --   BILITOT 1.3*  --   --    < > = values in this interval not displayed.   RADIOLOGY:  No results found. ASSESSMENT AND PLAN:  Maria Horton is a 79 y.o. female with medical history significant for hypertension, TIA, CKD 3, depression, restless legs and insomnia, with history of dyskinesia/parkinsonism, followed by neurologist, Dr. Sherryll Burger on Sinemet, started on amantadine on 05/07/2019 who was brought into  the emergency room after she had 3 falls in the last 48 hours  Recurrent falls -Likely from amantadine which is stopped  - CT Head shows no acute abnormalities -Appreciate neurology input.  -Continue home sinemet - Klonopin resumed last night.  Also on trazodone for sleep. -PT/OT eval    Parkinsonism (HCC)   Medication adverse effect, initial encounter -Continue Sinemet.  Hold amantadine for now     Polypharmacy -Patient is on Myrbetriq, ropinirole, melatonin, Sinemet as well as diuretic/Dyazide, bupropion, citalopram, hydroxyzine, trazodone, Klonopin at home -Appreciate neuro input -psychiatry consultation to look into medication adjustment    Hypertension -Hold diuretic treatment as this could increase fall risk in the elderly -Continue metoprolol    Anxiety and depression -Continue Klonopin , trazodone.  Resume Celexa and Wellbutrin if okay with psychiatry.  Hold hydroxyzine for now Psychiatry consultation    History of TIA (transient ischemic attack) -Continue aspirin, Plavix    Restless leg syndrome -Continue Requip    Hypoglycemia -Resolved.  She is eating and drinking.  We will hold off IV hydration at this time as she is pulling out her lines and telemetry   Status is: Inpatient  Remains inpatient appropriate because:Altered mental status   Dispo: The patient is from: Home              Anticipated d/c is to: Home              Anticipated d/c date is: 2 days              Patient currently is not medically stable to d/c.     DVT prophylaxis: Lovenox Family Communication: Updated patient's daughter at bedside   All the records are reviewed and case discussed with Care Management/Social Worker. Management plans discussed with the patient, family and they are in agreement.  CODE STATUS: Full Code  TOTAL TIME TAKING CARE OF THIS PATIENT: 35 minutes.   More than 50% of the time was spent in counseling/coordination of care: YES  POSSIBLE D/C IN  2-3 DAYS, DEPENDING ON CLINICAL CONDITION.   Delfino Lovett M.D on 05/13/2019 at 12:37 PM  [facility]   CC: Primary care physician; Gracelyn Nurse, MD  Note: This dictation was prepared with Dragon dictation along with smaller phrase technology. Any transcriptional errors that result from this process are unintentional.

## 2019-05-13 NOTE — Progress Notes (Signed)
   05/13/19 1705  Assess: MEWS Score  Temp 99.6 F (37.6 C)  BP (!) 153/123  Pulse Rate (!) 115  Resp 18  SpO2 98 %  O2 Device Room Air  Assess: MEWS Score  MEWS Temp 0  MEWS Systolic 0  MEWS Pulse 2  MEWS RR 0  MEWS LOC 0  MEWS Score 2  MEWS Score Color Yellow  Assess: if the MEWS score is Yellow or Red  Were vital signs taken at a resting state? No (pt restless, tardive dyskensia, tramors lying in bed; MD awa)  Focused Assessment Documented focused assessment  Early Detection of Sepsis Score *See Row Information* Low  MEWS guidelines implemented *See Row Information* No, altered LOC is baseline  pt's adm dx tardive dyskinesia; med hx includes Parkinson's; pt will not lay still to take accurate VS; MD aware

## 2019-05-13 NOTE — Progress Notes (Signed)
PT Cancellation Note  Patient Details Name: Maria Horton MRN: [id number] DOB: March 29, 1940   Cancelled Treatment:    Reason Eval/Treat Not Completed: Other (comment). Chart reviewed. Evaluation attempted, however pt still exhibiting restlessness and tardive dyskinesia with mouth and hands. Pt able to state her name and that she likes to be called "Maria Horton", however easily falls asleep. Daughter asleep in chair. Per RN, pt and daughter have been awake for almost 36 hours and are very tired. Pt unable to maintain alertness for participation. RN states they are still adjusting meds and hopeful tomorrow with be a more productive evaluation. Will re-attempt next date.    Gelena Klosinski 05/13/2019, 1:27 PM  Elizabeth Palau, PT, DPT [phone]

## 2019-05-14 ENCOUNTER — Encounter: Payer: Self-pay | Admitting: Internal Medicine

## 2019-05-14 ENCOUNTER — Inpatient Hospital Stay: Payer: Medicare Other

## 2019-05-14 DIAGNOSIS — Z8673 Personal history of transient ischemic attack (TIA), and cerebral infarction without residual deficits: Secondary | ICD-10-CM

## 2019-05-14 LAB — ECHOCARDIOGRAM COMPLETE
Height: 64 in
Weight: 2096 oz

## 2019-05-14 LAB — BASIC METABOLIC PANEL
Anion gap: 12 (ref 5–15)
BUN: 24 mg/dL — ABNORMAL HIGH (ref 8–23)
CO2: 29 mmol/L (ref 22–32)
Calcium: 10.1 mg/dL (ref 8.9–10.3)
Chloride: 102 mmol/L (ref 98–111)
Creatinine, Ser: 0.82 mg/dL (ref 0.44–1.00)
GFR calc Af Amer: >60 mL/min (ref >60)
GFR calc non Af Amer: >60 mL/min (ref >60)
Glucose, Bld: 79 mg/dL (ref 70–99)
Potassium: 3.5 mmol/L (ref 3.5–5.1)
Sodium: 143 mmol/L (ref 135–145)

## 2019-05-14 LAB — CBC
HCT: 35.6 % — ABNORMAL LOW (ref 36.0–46.0)
Hemoglobin: 12.1 g/dL (ref 12.0–15.0)
MCH: 30.9 pg (ref 26.0–34.0)
MCHC: 34 g/dL (ref 30.0–36.0)
MCV: 91 fL (ref 80.0–100.0)
Platelets: 193 10*3/uL (ref 150–400)
RBC: 3.91 MIL/uL (ref 3.87–5.11)
RDW: 14.2 % (ref 11.5–15.5)
WBC: 14.9 10*3/uL — ABNORMAL HIGH (ref 4.0–10.5)
nRBC: 0 % (ref 0.0–0.2)

## 2019-05-14 LAB — GLUCOSE, CAPILLARY: Glucose-Capillary: 75 mg/dL (ref 70–99)

## 2019-05-14 LAB — CK: Total CK: 4336 U/L — ABNORMAL HIGH (ref 38–234)

## 2019-05-14 MED ORDER — SALINE SPRAY 0.65 % NA SOLN
1.0000 | NASAL | Status: DC | PRN
  Filled 2019-05-14: qty 44

## 2019-05-14 MED ORDER — SODIUM CHLORIDE 0.9 % IV SOLN: INTRAVENOUS | Status: AC

## 2019-05-14 MED ORDER — PANTOPRAZOLE SODIUM 40 MG PO TBEC
40.0000 mg | DELAYED_RELEASE_TABLET | Freq: Every day | ORAL | Status: DC
  Administered 2019-05-14 – 2019-05-15 (×2): 40 mg via ORAL
  Filled 2019-05-14 (×2): qty 1

## 2019-05-14 MED ORDER — IOHEXOL 300 MG/ML  SOLN: 100.0000 mL | Freq: Once | INTRAMUSCULAR | Status: DC | PRN

## 2019-05-14 NOTE — Care Management Important Message (Signed)
Important Message  Patient Details  Name: Maria Horton MRN: [id number] Date of Birth: [birth year]   Medicare Important Message Given:  Yes     Olegario Messier A Keeshia Sanderlin 05/14/2019, 11:34 AM

## 2019-05-14 NOTE — Progress Notes (Signed)
Subjective: Still confused and disoriented. Daughter not a bedside to give hx of nightly progression. Patient started on home medications including clonopin.    Past Medical History:  Diagnosis Date  . Anxiety   . Chronic back pain   . Constipation   . Depression   . Diverticulosis   . Fibromyalgia   . Gastroparesis   . GERD (gastroesophageal reflux disease)   . Hypertension   . Hypoglycemia   . Hypokalemia   . Osteoarthritis   . PONV (postoperative nausea and vomiting)   . Reflux   . Stroke ([place])   . TIA (transient ischemic attack)   . Vascular dementia ([place])   . Vitamin D deficiency     Past Surgical History:  Procedure Laterality Date  . ABDOMINAL HYSTERECTOMY    . ABDOMINOPLASTY    . BREAST BIOPSY Left 2005   bengin, with clip  . BUNIONECTOMY Bilateral   . CHOLECYSTECTOMY    . COLON SURGERY    . FLEXIBLE BRONCHOSCOPY Bilateral 01/11/2018   Procedure: FLEXIBLE BRONCHOSCOPY;  Surgeon: Ottie Glazier, MD;  Location: [facility];  Service: Thoracic;  Laterality: Bilateral;  . LEFT HEART CATH AND CORONARY ANGIOGRAPHY Left 10/26/2017   Procedure: LEFT HEART CATH AND CORONARY ANGIOGRAPHY;  Surgeon: Isaias Cowman, MD;  Location: [facility];  Service: Cardiovascular;  Laterality: Left;  . NECK SURGERY    . PACEMAKER INSERTION N/A 12/14/2016   Procedure: INSERTION PACEMAKER;  Surgeon: Isaias Cowman, MD;  Location: [facility];  Service: Cardiovascular;  Laterality: N/A;  . ROTATOR CUFF REPAIR    . TONSILLECTOMY      Family History  Problem Relation Age of Onset  . Hypertension Mother   . Breast cancer Sister 2    Social History:  reports that she has never smoked. She has never used smokeless tobacco. She reports that she does not drink alcohol or use drugs.  Allergies  Allergen Reactions  . 5-Alpha Reductase Inhibitors   . Ace Inhibitors Other (See Comments)    achy  . Beta Adrenergic Blockers Other (See Comments)    Makes achy/feel sick  .  Calcium Channel Blockers Other (See Comments)    Feels achy like sick  . Latex Rash  . Tape Rash    Medications: I have reviewed the patient's current medications.  ROS: Unable to to obtain as confused   Physical Examination: Blood pressure 108/83, pulse 83, temperature 97.9 F (36.6 C), temperature source Oral, resp. rate 18, height _0  (1.626 m), weight 61.2 kg, SpO2 98 %.    Neurological Examination   Mental Status: Alert, oriented to name only. States she is home  Cranial Nerves: II: Discs flat bilaterally; Visual fields grossly normal, pupils equal, round, reactive to light and accommodation III,IV, VI: ptosis not present, extra-ocular motions intact bilaterally V,VII: smile symmetric, facial light touch sensation normal bilaterally VIII: hearing normal bilaterally IX,X: gag reflex present XI: bilateral shoulder shrug XII: midline tongue extension Motor: Seen moving all her extremities with increase in the tremor.  Tone and bulk:normal tone throughout; no atrophy noted Sensory: Pinprick and light touch intact throughout, bilaterally Deep Tendon Reflexes: 1+ and symmetric throughout     Laboratory Studies:   Basic Metabolic Panel: Recent Labs  Lab 05/11/19 1752 05/12/19 0547 05/14/19 0318  NA 141 142 143  K 3.5 3.5 3.5  CL 100 102 102  CO2 _1 GLUCOSE 67* 76 79  BUN 18 21 24*  CREATININE 1.20* 1.19* 0.82  CALCIUM 9.1  9.3 10.1    Liver Function Tests: Recent Labs  Lab 05/11/19 1752  AST 24  ALT 6  ALKPHOS 42  BILITOT 1.3*  PROT 6.3*  ALBUMIN 3.8   No results for input(s): LIPASE, AMYLASE in the last 168 hours. No results for input(s): AMMONIA in the last 168 hours.  CBC: Recent Labs  Lab 05/11/19 1752 05/12/19 0547 05/14/19 0318  WBC 9.3 9.8 14.9*  NEUTROABS 6.3  --   --   HGB 12.4 12.2 12.1  HCT 36.2 35.2* 35.6*  MCV 91.2 90.7 91.0  PLT 210 204 193    Cardiac Enzymes: Recent Labs  Lab 05/13/19 1413  CKTOTAL 6,604*     BNP: Invalid input(s): POCBNP  CBG: Recent Labs  Lab 05/12/19 0602 05/13/19 0545  GLUCAP 82 40    Microbiology: Results for orders placed or performed during the hospital encounter of 05/11/19  SARS CORONAVIRUS 2 (TAT 6-24 HRS) Nasopharyngeal Nasopharyngeal Swab     Status: None   Collection Time: 05/11/19  9:02 PM   Specimen: Nasopharyngeal Swab  Result Value Ref Range Status   SARS Coronavirus 2 NEGATIVE NEGATIVE Final    Comment: (NOTE) SARS-CoV-2 target nucleic acids are NOT DETECTED. The SARS-CoV-2 RNA is generally detectable in upper and lower respiratory specimens during the acute phase of infection. Negative results do not preclude SARS-CoV-2 infection, do not rule out co-infections with other pathogens, and should not be used as the sole basis for treatment or other patient management decisions. Negative results must be combined with clinical observations, patient history, and epidemiological information. The expected result is Negative. Fact Sheet for Patients: SugarRoll.be Fact Sheet for Healthcare Providers: https://www.woods-mathews.com/ This test is not yet approved or cleared by the [place] FDA and  has been authorized for detection and/or diagnosis of SARS-CoV-2 by FDA under an Emergency Use Authorization (EUA). This EUA will remain  in effect (meaning this test can be used) for the duration of the COVID-19 declaration under Section 56 4(b)(1) of the Act, 21 U.S.C. section 360bbb-3(b)(1), unless the authorization is terminated or revoked sooner. Performed at [facility], [place] [address], [place], [place] [address]     Coagulation Studies: No results for input(s): LABPROT, INR in the last 72 hours.  Urinalysis:  Recent Labs  Lab 05/12/19 0319  COLORURINE AMBER*  LABSPEC 1.023  PHURINE 6.0  GLUCOSEU NEGATIVE  HGBUR NEGATIVE  BILIRUBINUR NEGATIVE  KETONESUR 5*  PROTEINUR NEGATIVE  NITRITE  NEGATIVE  LEUKOCYTESUR TRACE*    Lipid Panel:     Component Value Date/Time   CHOL 142 11/15/2016 0652   TRIG 232 (H) 11/15/2016 0652   HDL 31 (L) 11/15/2016 0652   CHOLHDL 4.6 11/15/2016 0652   VLDL 46 (H) 11/15/2016 0652   LDLCALC 65 11/15/2016 0652    HgbA1C:  Lab Results  Component Value Date   HGBA1C 5.4 11/15/2016    Urine Drug Screen:      Component Value Date/Time   LABOPIA NONE DETECTED 11/14/2016 2300   COCAINSCRNUR NONE DETECTED 11/14/2016 2300   LABBENZ NONE DETECTED 11/14/2016 2300   AMPHETMU NONE DETECTED 11/14/2016 2300   THCU NONE DETECTED 11/14/2016 2300   LABBARB NONE DETECTED 11/14/2016 2300    Alcohol Level: No results for input(s): ETH in the last 168 hours.  Other results: EKG: normal EKG, normal sinus rhythm, unchanged from previous tracings.  Imaging: No results found.   Assessment/Plan:  79 y.o. femalewith medical history significant for hypertension, TIA, CKD 3, depression, restless  legs and insomnia, with history of dyskinesia/parkinsonism, followed by neurologist, Dr. Manuella Ghazi on Sinemet, started on amantadine on 05/07/2019 who was brought into the emergency room after she had 3 falls in the last 48 hours.  Most of the history is taken from the ER records.  Apparently in spite of parkinsonism patient has not had any falls until being started on the amantadine.  She was supposedly supposed to slowly titrate the dose up but daughter started her on the 100 mg dose from the get go.    - CTH no acute abnormalities - higher dose of amantadine starting does cause dopaminergic affects such as hypotension and postural instability which could have contributed to the falls.  - Pt started on home sinemet, clonopin - Would appreciate potentially psychiatric input to be considered as polypharmacy with clonopin, seroquel, trazodone, etc. 05/14/2019, 11:19 AM

## 2019-05-14 NOTE — NC FL2 (Signed)
MEDICAID FL2 LEVEL OF CARE SCREENING TOOL     IDENTIFICATION  Patient Name: Maria Horton Birthdate: 02-10-1940 Sex: female Admission Date (Current Location): 05/11/2019  [facility] and [place] Number:  Chiropodist and Address:  [facility], [address] [place], [place] [address]      Provider Number: [id number]  Attending Physician Name and Address:  Delfino Lovett, MD  Relative Name and Phone Number:  Nadara Mustard [phone]    Current Level of Care: Hospital Recommended Level of Care: Skilled Nursing Facility Prior Approval Number:    Date Approved/Denied:   PASRR Number: pending PASSAR  Discharge Plan: SNF    Current Diagnoses: Patient Active Problem List   Diagnosis Date Noted  . Recurrent falls 05/11/2019  . Parkinsonism (HCC) 05/11/2019  . Anxiety and depression 05/11/2019  . History of TIA (transient ischemic attack) 05/11/2019  . Restless leg syndrome 05/11/2019  . Medication adverse effect, initial encounter 05/11/2019  . Polypharmacy 05/11/2019  . Falls 05/11/2019  . Hypoglycemia 05/11/2019  . 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 Sick sinus syndrome (HCC) 12/14/2016  . Fall at home, initial encounter 11/14/2016  . TIA (transient ischemic attack) 09/10/2016  . Chronic pain syndrome 09/30/2015  . GERD (gastroesophageal reflux disease) 09/29/2015  . Severe recurrent major depression without psychotic features (HCC) 09/26/2015  . Fibromyalgia 09/26/2015  . Osteoarthritis 09/26/2015  . Hypertension 09/26/2015    Orientation RESPIRATION BLADDER Height & Weight     Self, Place  Normal Incontinent Weight: 135 lb (61.2 kg) Height:  5\' 4"  (162.6 cm)  BEHAVIORAL SYMPTOMS/MOOD NEUROLOGICAL BOWEL NUTRITION STATUS      Continent Diet(regular thin)  AMBULATORY STATUS COMMUNICATION OF NEEDS Skin   Limited Assist Verbally Normal                       Personal Care Assistance Level of Assistance  Bathing,  Dressing Bathing Assistance: Limited assistance   Dressing Assistance: Limited assistance     Functional Limitations Info  Sight Sight Info: Impaired        SPECIAL CARE FACTORS FREQUENCY  PT (By licensed PT)     PT Frequency: 5x week              Contractures Contractures Info: Not present    Additional Factors Info  Code Status, Allergies Code Status Info: Full Code Allergies Info: Ace inhibitors, Beta Blockers, Tape, latex           Current Medications (05/14/2019):  This is the current hospital active medication list Current Facility-Administered Medications  Medication Dose Route Frequency Provider Last Rate Last Admin  . 0.9 %  sodium chloride infusion   Intravenous Continuous 05/23/2019, MD      . acetaminophen (TYLENOL) tablet 650 mg  650 mg Oral Q6H PRN Delfino Lovett, MD       Or  . acetaminophen (TYLENOL) suppository 650 mg  650 mg Rectal Q6H PRN Andris Baumann, MD      . aspirin EC tablet 81 mg  81 mg Oral Daily Andris Baumann, MD   81 mg at 05/13/19 1015  . bisacodyl (DULCOLAX) EC tablet 5 mg  5 mg Oral Daily PRN 05/15/19, MD      . carbidopa-levodopa (SINEMET CR) 50-200 MG per tablet controlled release 1 tablet  1 tablet Oral QHS Andris Baumann, MD   1 tablet at 05/13/19 2221  . carbidopa-levodopa (SINEMET IR) 25-100 MG per tablet immediate  release 1 tablet  1 tablet Oral TID Athena Masse, MD   1 tablet at 05/13/19 2215  . clonazePAM (KLONOPIN) tablet 0.5 mg  0.5 mg Oral QHS Lang Snow, NP   0.5 mg at 05/13/19 2216  . clopidogrel (PLAVIX) tablet 75 mg  75 mg Oral Daily Athena Masse, MD   75 mg at 05/13/19 1015  . enoxaparin (LOVENOX) injection 40 mg  40 mg Subcutaneous Q24H Judd Gaudier V, MD   40 mg at 05/13/19 1010  . feeding supplement (ENSURE ENLIVE) (ENSURE ENLIVE) liquid 237 mL  237 mL Oral BID BM Manuella Ghazi, Vipul, MD   237 mL at 05/13/19 1412  . lidocaine (LIDODERM) 5 % 1 patch  1 patch Transdermal Q24H Sable Feil,  PA-C   1 patch at 05/13/19 1557  . melatonin tablet 10 mg  10 mg Oral QHS Athena Masse, MD   10 mg at 05/13/19 2215  . metoprolol succinate (TOPROL-XL) 24 hr tablet 12.5 mg  12.5 mg Oral Daily Judd Gaudier V, MD   12.5 mg at 05/13/19 1015  . mirabegron ER (MYRBETRIQ) tablet 50 mg  50 mg Oral QHS Athena Masse, MD   50 mg at 05/13/19 2215  . multivitamin with minerals tablet 1 tablet  1 tablet Oral Daily Max Sane, MD   1 tablet at 05/13/19 1015  . ondansetron (ZOFRAN) tablet 4 mg  4 mg Oral Q6H PRN Athena Masse, MD       Or  . ondansetron [facility]) injection 4 mg  4 mg Intravenous Q6H PRN Athena Masse, MD      . QUEtiapine (SEROQUEL) tablet 25 mg  25 mg Oral BID Max Sane, MD   25 mg at 05/13/19 2216  . rOPINIRole (REQUIP) tablet 1 mg  1 mg Oral QHS Athena Masse, MD   1 mg at 05/13/19 2216  . sodium chloride flush (NS) 0.9 % injection 3 mL  3 mL Intravenous Q12H Athena Masse, MD   3 mL at 05/13/19 2230  . traZODone (DESYREL) tablet 100 mg  100 mg Oral QHS Lang Snow, NP   100 mg at 05/13/19 2216     Discharge Medications: Please see discharge summary for a list of discharge medications.  Relevant Imaging Results:  Relevant Lab Results:   Additional Information SSN: [id number]  Mry Lamia, Gardiner Rhyme, LCSW

## 2019-05-14 NOTE — TOC Initial Note (Signed)
Transition of Care [facility]) - Initial/Assessment Note    Patient Details  Name: Maria Horton MRN: [id number] Date of Birth: 1940-03-07  [facility] of [facility]) CM/SW Contact:    Elease Hashimoto, LCSW Phone Number: 05/14/2019, 12:46 PM  Clinical Narrative:    Met with pt and daughter who reports to prior to admission she was doing well at home with her rollator and was not tremoring this much. She could feed herself and only needed minimal assist. She moved in with pt when COVID outbreak happened and took her out of [facility] where she was for rehab. The plan is to take her back home and hopefully get her meds back to managing her Parkinson's. They are aware the PT saw her and recommended going to rehab.  They want to wait to see how she does here and hopefully get her back home. Will work on discharge needs.             Expected Discharge Plan: [place] Barriers to Discharge: Continued Medical Work up   Patient Goals and CMS Choice Patient states their goals for this hospitalization and ongoing recovery are:: Daughter moved in with to assist with her care, was doing well at home prior to admission      Expected Discharge Plan and Services Expected Discharge Plan: [place] In-house Referral: Clinical Social Work   Post Acute Care Choice: [place], [place] Living arrangements for the past 2 months: Single Family Home                                      Prior Living Arrangements/Services Living arrangements for the past 2 months: Single Family Home Lives with:: Adult Children Patient language and need for interpreter reviewed:: No Do you feel safe going back to the place where you live?: Yes      Need for Family Participation in Patient Care: Yes (Comment) Care giver support system in place?: Yes (comment) Current home services: DME(has a rollator and shower chair) Criminal Activity/Legal Involvement Pertinent to  Current Situation/Hospitalization: No - Comment as needed  Activities of Daily Living Home Assistive Devices/Equipment: Gilford Rile (specify type) ADL Screening (condition at time of admission) Patient's cognitive ability adequate to safely complete daily activities?: No Is the patient deaf or have difficulty hearing?: Yes Does the patient have difficulty seeing, even when wearing glasses/contacts?: No Does the patient have difficulty concentrating, remembering, or making decisions?: Yes Patient able to express need for assistance with ADLs?: No Does the patient have difficulty dressing or bathing?: Yes Independently performs ADLs?: No Communication: Independent Dressing (OT): Needs assistance Is this a change from baseline?: Change from baseline, expected to last <3days Grooming: Needs assistance Is this a change from baseline?: Change from baseline, expected to last <3 days Feeding: Needs assistance Is this a change from baseline?: Change from baseline, expected to last <3 days Bathing: Needs assistance Is this a change from baseline?: Change from baseline, expected to last <3 days Toileting: Needs assistance Is this a change from baseline?: Change from baseline, expected to last <3 days In/Out Bed: Needs assistance Is this a change from baseline?: Change from baseline, expected to last <3 days Walks in Home: Needs assistance Is this a change from baseline?: Change from baseline, expected to last <3 days Does the patient have difficulty walking or climbing stairs?: Yes Weakness of Legs: Both  Weakness of Arms/Hands: Both  Permission Sought/Granted Permission sought to share information with : Family Supports Permission granted to share information with : Yes, Verbal Permission Granted  Share Information with NAME: Launa     Permission granted to share info w Relationship: daughter     Emotional Assessment Appearance:: Appears stated age Attitude/Demeanor/Rapport: Gracious Affect  (typically observed): Adaptable, Other (comment)(tremoring due to parkinsons') Orientation: : Oriented to Self, Oriented to Place, Oriented to  Time, Oriented to Situation Alcohol / Substance Use: Never Used Psych Involvement: No (comment)  Admission diagnosis:  Tardive dyskinesia [G24.01] Falls [W19.XXXA] Sprain of right wrist, initial encounter [S49.675F] Fall in home, initial encounter [W19.XXXA, F63.846] Sprain of right ankle, unspecified ligament, initial encounter [S93.401A] Patient Active Problem List   Diagnosis Date Noted  . Recurrent falls 05/11/2019  . Parkinsonism ([place]) 05/11/2019  . Anxiety and depression 05/11/2019  . History of TIA (transient ischemic attack) 05/11/2019  . Restless leg syndrome 05/11/2019  . Medication adverse effect, initial encounter 05/11/2019  . Polypharmacy 05/11/2019  . Falls 05/11/2019  . Hypoglycemia 05/11/2019  . 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 Sick sinus syndrome ([place]) 12/14/2016  . Fall at home, initial encounter 11/14/2016  . TIA (transient ischemic attack) 09/10/2016  . Chronic pain syndrome 09/30/2015  . GERD (gastroesophageal reflux disease) 09/29/2015  . Severe recurrent major depression without psychotic features ([place]) 09/26/2015  . Fibromyalgia 09/26/2015  . Osteoarthritis 09/26/2015  . Hypertension 09/26/2015   PCP:  Baxter Hire, MD Pharmacy:   [id number] [place]Phone: [phone]: [phone]    Social Determinants of Health (SDOH) Interventions    Readmission Risk Interventions No flowsheet data found.

## 2019-05-14 NOTE — Progress Notes (Addendum)
1        [place] at [facility]   PATIENT NAME: Maria Horton    MR#:  [id number]  DATE OF BIRTH:  10-07-1940  SUBJECTIVE:  CHIEF COMPLAINT:   Chief Complaint  Patient presents with  . Fall  Overall less movements, no hallucination, REVIEW OF SYSTEMS:  Review of Systems  Constitutional: Negative for diaphoresis, fever, malaise/fatigue and weight loss.  HENT: Negative for ear discharge, ear pain, hearing loss, nosebleeds, sore throat and tinnitus.   Eyes: Negative for blurred vision and pain.  Respiratory: Negative for cough, hemoptysis, shortness of breath and wheezing.   Cardiovascular: Negative for chest pain, palpitations, orthopnea and leg swelling.  Gastrointestinal: Negative for abdominal pain, blood in stool, constipation, diarrhea, heartburn, nausea and vomiting.  Genitourinary: Negative for dysuria, frequency and urgency.  Musculoskeletal: Positive for falls. Negative for back pain and myalgias.  Skin: Negative for itching and rash.  Neurological: Positive for tremors. Negative for dizziness, tingling, focal weakness, seizures, weakness and headaches.  Psychiatric/Behavioral: Negative for depression.   DRUG ALLERGIES:   Allergies  Allergen Reactions  . 5-Alpha Reductase Inhibitors   . Ace Inhibitors Other (See Comments)    achy  . Beta Adrenergic Blockers Other (See Comments)    Makes achy/feel sick  . Calcium Channel Blockers Other (See Comments)    Feels achy like sick  . Latex Rash  . Tape Rash   VITALS:  Blood pressure 108/83, pulse 83, temperature 97.9 F (36.6 C), temperature source Oral, resp. rate 18, height 5\' 4"  (1.626 m), weight 61.2 kg, SpO2 98 %. PHYSICAL EXAMINATION:  Physical Exam HENT:     Head: Normocephalic and atraumatic.  Eyes:     Conjunctiva/sclera: Conjunctivae normal.     Pupils: Pupils are equal, round, and reactive to light.  Neck:     Thyroid: No thyromegaly.     Trachea: No tracheal deviation.  Cardiovascular:    Rate and Rhythm: Normal rate and regular rhythm.     Heart sounds: Normal heart sounds.  Pulmonary:     Effort: Pulmonary effort is normal. No respiratory distress.     Breath sounds: Normal breath sounds. No wheezing.  Chest:     Chest wall: No tenderness.  Abdominal:     General: Bowel sounds are normal. There is no distension.     Palpations: Abdomen is soft.     Tenderness: There is no abdominal tenderness.  Musculoskeletal:        General: Normal range of motion.     Cervical back: Normal range of motion and neck supple.  Skin:    General: Skin is warm and dry.     Findings: No rash.  Neurological:     Mental Status: She is alert and oriented to person, place, and time.     Cranial Nerves: No cranial nerve deficit.     Motor: Tremor present.     Comments: Tardive dyskinesia    LABORATORY PANEL:  Female CBC Recent Labs  Lab 05/14/19 0318  WBC 14.9*  HGB 12.1  HCT 35.6*  PLT 193   ------------------------------------------------------------------------------------------------------------------ Chemistries  Recent Labs  Lab 05/11/19 1752 05/12/19 0547 05/14/19 0318  NA 141   < > 143  K 3.5   < > 3.5  CL 100   < > 102  CO2 29   < > 29  GLUCOSE 67*   < > 79  BUN 18   < > 24*  CREATININE 1.20*   < > 0.82  CALCIUM 9.1   < > 10.1  AST 24  --   --   ALT 6  --   --   ALKPHOS 42  --   --   BILITOT 1.3*  --   --    < > = values in this interval not displayed.   RADIOLOGY:  CT ABDOMEN PELVIS WO CONTRAST  Result Date: 05/14/2019 CLINICAL DATA:  Gross hematuria. EXAM: CT ABDOMEN AND PELVIS WITHOUT CONTRAST TECHNIQUE: Multidetector CT imaging of the abdomen and pelvis was performed following the standard protocol without IV contrast. COMPARISON:  None. FINDINGS: Lower chest: No acute abnormality. Hepatobiliary: No focal liver abnormality is seen. Status post cholecystectomy. No biliary dilatation. Pancreas: Unremarkable. No pancreatic ductal dilatation or surrounding  inflammatory changes. Spleen: Normal in size without focal abnormality. Adrenals/Urinary Tract: Adrenal glands appear normal. Small nonobstructive left renal calculus is noted. No hydronephrosis or renal obstruction is noted. Urinary bladder is unremarkable. Stomach/Bowel: Stomach is within normal limits. Appendix appears normal. No evidence of bowel wall thickening, distention, or inflammatory changes. Vascular/Lymphatic: Aortic atherosclerosis. No enlarged abdominal or pelvic lymph nodes. Reproductive: Status post hysterectomy. No adnexal masses. Other: No abdominal wall hernia or abnormality. No abdominopelvic ascites. Musculoskeletal: No acute or significant osseous findings. IMPRESSION: 1. Small nonobstructive left renal calculus. No hydronephrosis or renal obstruction is noted. Aortic Atherosclerosis (ICD10-I70.0). Electronically Signed   By: Lupita Raider M.D.   On: 05/14/2019 12:33   ASSESSMENT AND PLAN:  Maria Horton is a 79 y.o. female with medical history significant for hypertension, TIA, CKD 3, depression, restless legs and insomnia, with history of dyskinesia/parkinsonism, followed by neurologist, Dr. Sherryll Burger on Sinemet, started on amantadine on 05/07/2019 who was brought into the emergency room after she had 3 falls in the last 48 hours  Recurrent falls -Likely from amantadine which is stopped  - CT Head shows no acute abnormalities -Appreciate neurology and psychiatry input.  -Continue home sinemet -PT/OT recommends SNF    Parkinsonism (HCC)   Medication adverse effect, initial encounter -Continue Sinemet.  Hold amantadine for now     Polypharmacy -Continue Klonopin, trazodone, Myrbetriq, melatonin Requip for now.  Stop all other medications    Hypertension -Hold diuretic treatment as this could increase fall risk in the elderly -Continue metoprolol    Anxiety and depression -Continue Klonopin , trazodone.  Resume Celexa and Wellbutrin if okay with psychiatry.  Hold  hydroxyzine for now Psychiatry input appreciated    History of TIA (transient ischemic attack) -Continue aspirin, Plavix    Restless leg syndrome -Continue Requip    Hypoglycemia -Resolved.  She is eating and drinking.   Rhabdomyolysis Status post fall.  CK was 6604->4336.  Improving with IV hydration  Status is: Inpatient  Remains inpatient appropriate because:Altered mental status   Dispo: The patient is from: Home              Anticipated d/c is to: SNF              Anticipated d/c date is: 2 days              Patient currently is not medically stable to d/c.     DVT prophylaxis: Lovenox Family Communication: Updated patient's daughter over phone   All the records are reviewed and case discussed with Care Management/Social Worker. Management plans discussed with the patient, family and they are in agreement.  CODE STATUS: Full Code  TOTAL TIME TAKING CARE OF THIS PATIENT: 35 minutes.   More  than 50% of the time was spent in counseling/coordination of care: YES  POSSIBLE D/C IN 2-3 DAYS, DEPENDING ON CLINICAL CONDITION.   Max Sane M.D on 05/14/2019 at 4:30 PM  [facility]   CC: Primary care physician; Baxter Hire, MD  Note: This dictation was prepared with Dragon dictation along with smaller phrase technology. Any transcriptional errors that result from this process are unintentional.

## 2019-05-14 NOTE — Consult Note (Signed)
[facility] Face-to-Face Psychiatry Consult   Reason for Consult: Med eval  Referring Physician:  Floor MD Patient Identification: Maria Horton MRN:  [id number] Principal Diagnosis: Recurrent falls Diagnosis:  Principal Problem:   Recurrent falls Active Problems:   Hypertension   Parkinsonism ([place])   Anxiety and depression   History of TIA (transient ischemic attack)   Restless leg syndrome   Medication adverse effect, initial encounter   Polypharmacy   Falls   Hypoglycemia   Total Time spent with patient: 15 minutes  Subjective:   Maria Horton is a 79 y.o. female patient admitted with severe parkinsons and recent falls. Hx bipolar depression.  HPI:  Per PT eval  Pt. is a 79 y.o. female who was admitted to [facility] with recurrent falls, and Ataxia. PMHx includes: HTN, TIA, Insomnia, Parkinsonism, GERD, Fibromyalgia, Chronic Back Pain, Diverticulosis, Osteoporosis, Anxiety, and Depression.     Pt is a pleasant 79 year old female who was admitted for recurrent falls due to medication change. Pt performs bed mobility with min assist and unable to further progress mobility efforts due to safety. Continued tardive dyskinesia with mouth and hands. Given swab for moisture as pt's tongue is very dry and she has difficulty speaking. Then gave pt water and she was unable to feed herself due to tremors. Pt demonstrates deficits with mobility/safety/balance. Currently isn't at baseline level. Would benefit from skilled PT to address above deficits and promote optimal return to PLOF; recommend transition to STR upon discharge from acute hospitalization. Would expect quick progress once medication changes are addressed due to above mentioned symptoms. Once medically improved, anticipate possible change in discharge disposition.     Past Psychiatric History:  Risk to Self:   Risk to Others:   Prior Inpatient Therapy:   Prior Outpatient Therapy:    Past Medical History:  Past Medical History:   Diagnosis Date  . Anxiety   . Chronic back pain   . Constipation   . Depression   . Diverticulosis   . Fibromyalgia   . Gastroparesis   . GERD (gastroesophageal reflux disease)   . Hypertension   . Hypoglycemia   . Hypokalemia   . Osteoarthritis   . PONV (postoperative nausea and vomiting)   . Reflux   . Stroke ([place])   . TIA (transient ischemic attack)   . Vascular dementia ([place])   . Vitamin D deficiency     Past Surgical History:  Procedure Laterality Date  . ABDOMINAL HYSTERECTOMY    . ABDOMINOPLASTY    . BREAST BIOPSY Left 2005   bengin, with clip  . BUNIONECTOMY Bilateral   . CHOLECYSTECTOMY    . COLON SURGERY    . FLEXIBLE BRONCHOSCOPY Bilateral 01/11/2018   Procedure: FLEXIBLE BRONCHOSCOPY;  Surgeon: Ottie Glazier, MD;  Location: [facility] ORS;  Service: Thoracic;  Laterality: Bilateral;  . LEFT HEART CATH AND CORONARY ANGIOGRAPHY Left 10/26/2017   Procedure: LEFT HEART CATH AND CORONARY ANGIOGRAPHY;  Surgeon: Isaias Cowman, MD;  Location: [place] [facility];  Service: Cardiovascular;  Laterality: Left;  . NECK SURGERY    . PACEMAKER INSERTION N/A 12/14/2016   Procedure: INSERTION PACEMAKER;  Surgeon: Isaias Cowman, MD;  Location: [facility] ORS;  Service: Cardiovascular;  Laterality: N/A;  . ROTATOR CUFF REPAIR    . TONSILLECTOMY     Family History:  Family History  Problem Relation Age of Onset  . Hypertension Mother   . Breast cancer Sister 23   Family Psychiatric  History: Social History:  Social  History   Substance and Sexual Activity  Alcohol Use No     Social History   Substance and Sexual Activity  Drug Use No    Social History   Socioeconomic History  . Marital status: Widowed    Spouse name: Not on file  . Number of children: Not on file  . Years of education: Not on file  . Highest education level: Not on file  Occupational History  . Not on file  Tobacco Use  . Smoking status: Never Smoker  . Smokeless tobacco: Never Used   Substance and Sexual Activity  . Alcohol use: No  . Drug use: No  . Sexual activity: Not on file  Other Topics Concern  . Not on file  Social History Narrative  . Not on file   Social Determinants of Health   Financial Resource Strain:   . Difficulty of Paying Living Expenses:   Food Insecurity:   . Worried About Charity fundraiser in the Last Year:   . Arboriculturist in the Last Year:   Transportation Needs:   . Film/video editor (Medical):   Marland Kitchen Lack of Transportation (Non-Medical):   Physical Activity:   . Days of Exercise per Week:   . Minutes of Exercise per Session:   Stress:   . Feeling of Stress :   Social Connections:   . Frequency of Communication with Friends and Family:   . Frequency of Social Gatherings with Friends and Family:   . Attends Religious Services:   . Active Member of Clubs or Organizations:   . Attends Archivist Meetings:   Marland Kitchen Marital Status:    Additional Social History:    Allergies:   Allergies  Allergen Reactions  . 5-Alpha Reductase Inhibitors   . Ace Inhibitors Other (See Comments)    achy  . Beta Adrenergic Blockers Other (See Comments)    Makes achy/feel sick  . Calcium Channel Blockers Other (See Comments)    Feels achy like sick  . Latex Rash  . Tape Rash    Labs:  Results for orders placed or performed during the hospital encounter of 05/11/19 (from the past 48 hour(s))  Glucose, capillary     Status: None   Collection Time: 05/13/19  5:45 AM  Result Value Ref Range   Glucose-Capillary 77 70 - 99 mg/dL    Comment: Glucose reference range applies only to samples taken after fasting for at least 8 hours.   Comment 1 Notify RN   CK     Status: Abnormal   Collection Time: 05/13/19  2:13 PM  Result Value Ref Range   Total CK 6,604 (H) 38 - 234 U/L    Comment: RESULT CONFIRMED BY MANUAL DILUTION / JAG Performed at [facility], [address] [place], [place] [address]   CBC     Status: Abnormal    Collection Time: 05/14/19  3:18 AM  Result Value Ref Range   WBC 14.9 (H) 4.0 - 10.5 K/uL   RBC 3.91 3.87 - 5.11 MIL/uL   Hemoglobin 12.1 12.0 - 15.0 g/dL   HCT 35.6 (L) 36.0 - 46.0 %   MCV 91.0 80.0 - 100.0 fL   MCH 30.9 26.0 - 34.0 pg   MCHC 34.0 30.0 - 36.0 g/dL   RDW 14.2 11.5 - 15.5 %   Platelets 193 150 - 400 K/uL   nRBC 0.0 0.0 - 0.2 %    Comment: Performed at [facility], [address] [place], [place] [address]  Basic metabolic panel     Status: Abnormal   Collection Time: 05/14/19  3:18 AM  Result Value Ref Range   Sodium 143 135 - 145 mmol/L   Potassium 3.5 3.5 - 5.1 mmol/L   Chloride 102 98 - 111 mmol/L   CO2 29 22 - 32 mmol/L   Glucose, Bld 79 70 - 99 mg/dL    Comment: Glucose reference range applies only to samples taken after fasting for at least 8 hours.   BUN 24 (H) 8 - 23 mg/dL   Creatinine, Ser 0.82 0.44 - 1.00 mg/dL   Calcium 10.1 8.9 - 10.3 mg/dL   GFR calc non Af Amer >60 >60 mL/min   GFR calc Af Amer >60 >60 mL/min   Anion gap 12 5 - 15    Comment: Performed at [facility], [address] [place], [place] [address]  CK     Status: Abnormal   Collection Time: 05/14/19  3:18 AM  Result Value Ref Range   Total CK 4,336 (H) 38 - 234 U/L    Comment: RESULT CONFIRMED BY MANUAL DILUTION [place] Performed at [facility], [address] [place], [place] [address]   Glucose, capillary     Status: None   Collection Time: 05/14/19 11:23 AM  Result Value Ref Range   Glucose-Capillary 75 70 - 99 mg/dL    Comment: Glucose reference range applies only to samples taken after fasting for at least 8 hours.   Comment 1 Notify RN     Current Facility-Administered Medications  Medication Dose Route Frequency Provider Last Rate Last Admin  . 0.9 %  sodium chloride infusion   Intravenous Continuous Max Sane, MD 100 mL/hr at 05/14/19 1629 New Bag at 05/14/19 1629  . acetaminophen (TYLENOL) tablet 650 mg  650 mg Oral Q6H PRN  Athena Masse, MD       Or  . acetaminophen (TYLENOL) suppository 650 mg  650 mg Rectal Q6H PRN Athena Masse, MD      . aspirin EC tablet 81 mg  81 mg Oral Daily Athena Masse, MD   81 mg at 05/14/19 1116  . bisacodyl (DULCOLAX) EC tablet 5 mg  5 mg Oral Daily PRN Athena Masse, MD      . carbidopa-levodopa (SINEMET CR) 50-200 MG per tablet controlled release 1 tablet  1 tablet Oral QHS Athena Masse, MD   1 tablet at 05/13/19 2221  . carbidopa-levodopa (SINEMET IR) 25-100 MG per tablet immediate release 1 tablet  1 tablet Oral TID Athena Masse, MD   1 tablet at 05/14/19 1612  . clonazePAM (KLONOPIN) tablet 0.5 mg  0.5 mg Oral QHS Lang Snow, NP   0.5 mg at 05/13/19 2216  . clopidogrel (PLAVIX) tablet 75 mg  75 mg Oral Daily Athena Masse, MD   75 mg at 05/14/19 1115  . enoxaparin (LOVENOX) injection 40 mg  40 mg Subcutaneous Q24H Judd Gaudier V, MD   40 mg at 05/14/19 1130  . feeding supplement (ENSURE ENLIVE) (ENSURE ENLIVE) liquid 237 mL  237 mL Oral BID BM Manuella Ghazi, Vipul, MD   237 mL at 05/14/19 1419  . lidocaine (LIDODERM) 5 % 1 patch  1 patch Transdermal Q24H Sable Feil, PA-C   1 patch at 05/14/19 1612  . melatonin tablet 10 mg  10 mg Oral QHS Athena Masse, MD   10 mg at 05/13/19  2215  . metoprolol succinate (TOPROL-XL) 24 hr tablet 12.5 mg  12.5 mg Oral Daily Judd Gaudier V, MD   12.5 mg at 05/14/19 1115  . mirabegron ER (MYRBETRIQ) tablet 50 mg  50 mg Oral QHS Athena Masse, MD   50 mg at 05/13/19 2215  . multivitamin with minerals tablet 1 tablet  1 tablet Oral Daily Max Sane, MD   1 tablet at 05/13/19 1015  . ondansetron (ZOFRAN) tablet 4 mg  4 mg Oral Q6H PRN Athena Masse, MD       Or  . ondansetron [facility]) injection 4 mg  4 mg Intravenous Q6H PRN Athena Masse, MD      . pantoprazole (PROTONIX) EC tablet 40 mg  40 mg Oral Daily Manuella Ghazi, Vipul, MD      . QUEtiapine (SEROQUEL) tablet 25 mg  25 mg Oral BID Max Sane, MD   25 mg at 05/14/19  1114  . rOPINIRole (REQUIP) tablet 1 mg  1 mg Oral QHS Athena Masse, MD   1 mg at 05/13/19 2216  . sodium chloride (OCEAN) 0.65 % nasal spray 1 spray  1 spray Each Nare PRN Manuella Ghazi, Vipul, MD      . sodium chloride flush (NS) 0.9 % injection 3 mL  3 mL Intravenous Q12H Athena Masse, MD   3 mL at 05/13/19 2230  . traZODone (DESYREL) tablet 100 mg  100 mg Oral QHS Lang Snow, NP   100 mg at 05/13/19 2216    Psychiatric Specialty Exam: Physical Exam  Review of Systems  Blood pressure 108/83, pulse 83, temperature 97.9 F (36.6 C), temperature source Oral, resp. rate 18, height _0  (1.626 m), weight 61.2 kg, SpO2 98 %.Body mass index is 23.17 kg/m.  General Appearance: TD and severe PD disease  Eye Contact:  Fair  Speech:  Garbled  Volume:  Normal  Mood:  Euphoric  Affect:  Congruent  Thought Process:  Goal Directed  Orientation:  Full (Time, Place, and Person)  Thought Content:  Logical  Suicidal Thoughts:  No  Homicidal Thoughts:  No  Memory:  Immediate;   Good  Judgement:  Good  Insight:  Good  Psychomotor Activity:  Consistent with PD  Concentration:  Concentration: Good  Recall:  Good  Fund of Knowledge:  Good  Language:  Poor  Akathisia:  No  Handed:  Ambidextrous  AIMS (if indicated):     Assets:  Desire for Improvement Social Support  ADL's:  Impaired  Cognition:  WNL  Sleep:      I had a long and pleasant conversation with the patient and her daughter.  Her daughter is able to provide much of the information in a much more efficient way.  The patient has full understanding however the TD and Parkinson's interfere with her ability to communicate effectively.  Overall she states that she is doing well and has mild hallucinations which however did not appear to be causing her any distress. She also has some anxiety which appear to be related to her physical health and not the hallucinations.  She has a complicated medication regimen and the challenge at  this point will be the Parkinson's disease.  She and her daughter report good response to clonazepam/Klonopin primarily for their impact on anxiety.  The Klonopin apparently works to keep anxiety at a low level and she has taken hydroxyzine in the past for breakthrough anxiety.  It is unclear how much her frustration or anxiety here  is due to acute problems or simply the length of the hospitalization.  The current regimen appears to be mostly successful.  She has been started on Seroquel.  I fear that further increases will exacerbate her Parkinson's disease but I understand that the goal is to address the hallucinations.  It does appear that the current dosing regimen is successful and that further changes in the dose are unnecessary.  Treatment Plan Summary: per above, I do not see value in additional changes. Discharge from the hospital should help reduce some of her symptomatology  Disposition: D/C to home when medically stable  Alesia Morin, MD 05/14/2019 5:27 PM

## 2019-05-14 NOTE — Evaluation (Signed)
Physical Therapy Evaluation Patient Details Name: Maria Horton MRN: [id number] DOB: 1940/12/04 Today's Date: 05/14/2019   History of Present Illness  Pt. is a 79 y.o. female who was admitted to [facility] with recurrent falls, and Ataxia. PMHx includes: HTN, TIA, Insomnia, Parkinsonism, GERD, Fibromyalgia, Chronic Back Pain, Diverticulosis, Osteoporosis, Anxiety, and Depression.  Clinical Impression  Pt is a pleasant 79 year old female who was admitted for recurrent falls due to medication change. Pt performs bed mobility with min assist and unable to further progress mobility efforts due to safety. Continued tardive dyskinesia with mouth and hands. Given swab for moisture as pt's tongue is very dry and she has difficulty speaking. Then gave pt water and she was unable to feed herself due to tremors. Pt demonstrates deficits with mobility/safety/balance. Currently isn't at baseline level. Would benefit from skilled PT to address above deficits and promote optimal return to PLOF; recommend transition to STR upon discharge from acute hospitalization. Would expect quick progress once medication changes are addressed due to above mentioned symptoms. Once medically improved, anticipate possible change in discharge disposition.     Follow Up Recommendations SNF    Equipment Recommendations  None recommended by PT    Recommendations for Other Services       Precautions / Restrictions Precautions Precautions: Fall Restrictions Weight Bearing Restrictions: No      Mobility  Bed Mobility Overal bed mobility: Needs Assistance Bed Mobility: Supine to Sit     Supine to sit: Min assist     General bed mobility comments: able to follow commands for transitioning to EOB. Needs min assist for trunk support. Continued tremors noted with all movement, increased falls risk. Unable to maintain sitting position unsupported due to tremors and constant movement. Unsafe for further mobility progression.  Returned back supine.  Transfers                 General transfer comment: unsafe  Ambulation/Gait                Stairs            Wheelchair Mobility    Modified Rankin (Stroke Patients Only)       Balance Overall balance assessment: History of Falls;Needs assistance Sitting-balance support: Feet supported;Bilateral upper extremity supported Sitting balance-Leahy Scale: Poor Sitting balance - Comments: post leaning, constant ataxic movement. Unable to maintain seated balance without constant min assist                                     Pertinent Vitals/Pain Pain Assessment: No/denies pain    Home Living Family/patient expects to be discharged to:: Private residence Living Arrangements: Children Available Help at Discharge: Family;Available PRN/intermittently Type of Home: House Home Access: Stairs to enter Entrance Stairs-Rails: None Entrance Stairs-Number of Steps: 3 Home Layout: One level Home Equipment: Walker - 2 wheels;Walker - 4 wheels;Cane - single point;Bedside commode;Shower seat;Hand held shower head      Prior Function Level of Independence: Needs assistance      ADL's / Homemaking Assistance Needed: Pt's daughter reports that she assists the pt. with basic morning ADLs, meal preparation, and medication management. Pt. used a rollator walker. Increased falls recently.  Comments: Mod indep with RW for ADLs, household mobilization; does endorse multiple fall history (at least 6 in previous six months).  Was actively participation with HHPT     Hand Dominance   Dominant Hand:  Right    Extremity/Trunk Assessment   Upper Extremity Assessment Upper Extremity Assessment: (ataxia and tremors, gross strength intact)    Lower Extremity Assessment Lower Extremity Assessment: (ataxia and tremors, gross strength intact)       Communication   Communication: No difficulties  Cognition Arousal/Alertness:  Awake/alert Behavior During Therapy: Restless Overall Cognitive Status: Impaired/Different from baseline                                 General Comments: confused to place thinking she was in [place]. Able to be reoriented to place/time easily      General Comments      Exercises     Assessment/Plan    PT Assessment Patient needs continued PT services  PT Problem List Decreased strength;Decreased activity tolerance;Decreased balance;Decreased mobility;Decreased cognition;Decreased safety awareness       PT Treatment Interventions Gait training;DME instruction;Stair training;Therapeutic exercise;Balance training    PT Goals (Current goals can be found in the Care Plan section)  Acute Rehab PT Goals Patient Stated Goal: to feel safe getting out of bed PT Goal Formulation: With patient Time For Goal Achievement: 05/28/19 Potential to Achieve Goals: Good    Frequency Min 2X/week   Barriers to discharge Inaccessible home environment;Decreased caregiver support      Co-evaluation               AM-PAC PT "6 Clicks" Mobility  Outcome Measure Help needed turning from your back to your side while in a flat bed without using bedrails?: A Little Help needed moving from lying on your back to sitting on the side of a flat bed without using bedrails?: A Little Help needed moving to and from a bed to a chair (including a wheelchair)?: Total Help needed standing up from a chair using your arms (e.g., wheelchair or bedside chair)?: Total Help needed to walk in hospital room?: Total Help needed climbing 3-5 steps with a railing? : Total 6 Click Score: 10    End of Session   Activity Tolerance: Patient tolerated treatment well Patient left: in bed;with bed alarm set Nurse Communication: Mobility status PT Visit Diagnosis: Unsteadiness on feet (R26.81);Repeated falls (R29.6);Muscle weakness (generalized) (M62.81);History of falling (Z91.81);Difficulty in walking,  not elsewhere classified (R26.2);Apraxia (R48.2);Ataxic gait (R26.0)    Time: 8469-6295 PT Time Calculation (min) (ACUTE ONLY): 17 min   Charges:   PT Evaluation $PT Eval Low Complexity: 1 Low          Greggory Stallion, PT, DPT [phone]   Ronna Herskowitz 05/14/2019, 9:52 AM

## 2019-05-15 DIAGNOSIS — G20 Parkinson's disease: Secondary | ICD-10-CM

## 2019-05-15 DIAGNOSIS — Z7189 Other specified counseling: Secondary | ICD-10-CM

## 2019-05-15 DIAGNOSIS — Z515 Encounter for palliative care: Secondary | ICD-10-CM

## 2019-05-15 DIAGNOSIS — Z66 Do not resuscitate: Secondary | ICD-10-CM

## 2019-05-15 LAB — GLUCOSE, CAPILLARY: Glucose-Capillary: 80 mg/dL (ref 70–99)

## 2019-05-15 MED ORDER — CITALOPRAM HYDROBROMIDE 20 MG PO TABS
40.0000 mg | ORAL_TABLET | Freq: Every day | ORAL | Status: DC
  Administered 2019-05-15: 40 mg via ORAL
  Filled 2019-05-15: qty 2

## 2019-05-15 MED ORDER — BIOTENE DRY MOUTH MT LIQD
15.0000 mL | Freq: Two times a day (BID) | OROMUCOSAL | Status: DC
  Administered 2019-05-15 – 2019-05-16 (×3): 15 mL via OROMUCOSAL

## 2019-05-15 MED ORDER — BUPROPION HCL ER (XL) 150 MG PO TB24
300.0000 mg | ORAL_TABLET | Freq: Every day | ORAL | Status: DC
  Administered 2019-05-15 – 2019-05-16 (×2): 300 mg via ORAL
  Filled 2019-05-15 (×2): qty 2

## 2019-05-15 NOTE — TOC Progression Note (Addendum)
Transition of Care [facility]) - Progression Note    Patient Details  Name: Maria Horton MRN: [id number] Date of Birth: September 13, 1940  Transition [facility] CM/SW Contact  Danali Marinos, Lemar Livings, LCSW Phone Number: 05/15/2019, 12:32 PM  Clinical Narrative:  Daughter is here and wants to see pt in PT to see how well she moves. She wants to take her home but is not sure she can manage her. She may need to hire assist. She continues to go back to her prior function and wants to know if this will improve. Encouraged her to talk with the MD's regarding this. Would need hospital bed, wheelchair and other pieces of equipment possibly ramp before going home. Can EMS her home but daughter will need to be able to manage her at home.  2:00 Palliative care consult-Shay in to see, daughter wants to take pt home with hospice. Pref [facility] aware and will pursue case.  Expected Discharge Plan: Home w Home Health Services Barriers to Discharge: Continued Medical Work up  Expected Discharge Plan and Services Expected Discharge Plan: Home w Home Health Services In-house Referral: Clinical Social Work   Post Acute Care Choice: Home Health, Skilled Nursing Facility Living arrangements for the past 2 months: Single Family Home                                       Social Determinants of Health (SDOH) Interventions    Readmission Risk Interventions No flowsheet data found.

## 2019-05-15 NOTE — Progress Notes (Signed)
Subjective: Pt had a better night and slept some. On examination her tremors are not always present and change much less when resting and sleeping.    Past Medical History:  Diagnosis Date  . Anxiety   . Chronic back pain   . Constipation   . Depression   . Diverticulosis   . Fibromyalgia   . Gastroparesis   . GERD (gastroesophageal reflux disease)   . Hypertension   . Hypoglycemia   . Hypokalemia   . Osteoarthritis   . PONV (postoperative nausea and vomiting)   . Reflux   . Stroke ([place])   . TIA (transient ischemic attack)   . Vascular dementia ([place])   . Vitamin D deficiency     Past Surgical History:  Procedure Laterality Date  . ABDOMINAL HYSTERECTOMY    . ABDOMINOPLASTY    . BREAST BIOPSY Left 2005   bengin, with clip  . BUNIONECTOMY Bilateral   . CHOLECYSTECTOMY    . COLON SURGERY    . FLEXIBLE BRONCHOSCOPY Bilateral 01/11/2018   Procedure: FLEXIBLE BRONCHOSCOPY;  Surgeon: Ottie Glazier, MD;  Location: [facility] ORS;  Service: Thoracic;  Laterality: Bilateral;  . LEFT HEART CATH AND CORONARY ANGIOGRAPHY Left 10/26/2017   Procedure: LEFT HEART CATH AND CORONARY ANGIOGRAPHY;  Surgeon: Isaias Cowman, MD;  Location: [place] [facility];  Service: Cardiovascular;  Laterality: Left;  . NECK SURGERY    . PACEMAKER INSERTION N/A 12/14/2016   Procedure: INSERTION PACEMAKER;  Surgeon: Isaias Cowman, MD;  Location: [facility] ORS;  Service: Cardiovascular;  Laterality: N/A;  . ROTATOR CUFF REPAIR    . TONSILLECTOMY      Family History  Problem Relation Age of Onset  . Hypertension Mother   . Breast cancer Sister 3    Social History:  reports that she has never smoked. She has never used smokeless tobacco. She reports that she does not drink alcohol or use drugs.  Allergies  Allergen Reactions  . 5-Alpha Reductase Inhibitors   . Ace Inhibitors Other (See Comments)    achy  . Beta Adrenergic Blockers Other (See Comments)    Makes achy/feel sick  . Calcium  Channel Blockers Other (See Comments)    Feels achy like sick  . Latex Rash  . Tape Rash    Medications: I have reviewed the patient's current medications.   Physical Examination: Blood pressure (!) 159/97, pulse 80, temperature 99.7 F (37.6 C), temperature source Axillary, resp. rate 20, height _0  (1.626 m), weight 61.2 kg, SpO2 95 %.    Neurological Examination   Mental Status: Alert, oriented to name only. States she is home  Cranial Nerves: II: Discs flat bilaterally; Visual fields grossly normal, pupils equal, round, reactive to light and accommodation III,IV, VI: ptosis not present, extra-ocular motions intact bilaterally V,VII: smile symmetric, facial light touch sensation normal bilaterally VIII: hearing normal bilaterally IX,X: gag reflex present XI: bilateral shoulder shrug XII: midline tongue extension Motor: Seen moving all her extremities with increase in the tremor.  Tone and bulk:normal tone throughout; no atrophy noted Sensory: Pinprick and light touch intact throughout, bilaterally Deep Tendon Reflexes: 1+ and symmetric throughout     Laboratory Studies:   Basic Metabolic Panel: Recent Labs  Lab 05/11/19 1752 05/12/19 0547 05/14/19 0318  NA 141 142 143  K 3.5 3.5 3.5  CL 100 102 102  CO2 _1 GLUCOSE 67* 76 79  BUN 18 21 24*  CREATININE 1.20* 1.19* 0.82  CALCIUM 9.1 9.3 10.1  Liver Function Tests: Recent Labs  Lab 05/11/19 1752  AST 24  ALT 6  ALKPHOS 42  BILITOT 1.3*  PROT 6.3*  ALBUMIN 3.8   No results for input(s): LIPASE, AMYLASE in the last 168 hours. No results for input(s): AMMONIA in the last 168 hours.  CBC: Recent Labs  Lab 05/11/19 1752 05/12/19 0547 05/14/19 0318  WBC 9.3 9.8 14.9*  NEUTROABS 6.3  --   --   HGB 12.4 12.2 12.1  HCT 36.2 35.2* 35.6*  MCV 91.2 90.7 91.0  PLT 210 204 193    Cardiac Enzymes: Recent Labs  Lab 05/13/19 1413 05/14/19 0318  CKTOTAL 6,604* 4,336*    BNP: Invalid  input(s): POCBNP  CBG: Recent Labs  Lab 05/12/19 0602 05/13/19 0545 05/14/19 1123 05/15/19 0507  GLUCAP 82 77 75 80    Microbiology: Results for orders placed or performed during the hospital encounter of 05/11/19  SARS CORONAVIRUS 2 (TAT 6-24 HRS) Nasopharyngeal Nasopharyngeal Swab     Status: None   Collection Time: 05/11/19  9:02 PM   Specimen: Nasopharyngeal Swab  Result Value Ref Range Status   SARS Coronavirus 2 NEGATIVE NEGATIVE Final    Comment: (NOTE) SARS-CoV-2 target nucleic acids are NOT DETECTED. The SARS-CoV-2 RNA is generally detectable in upper and lower respiratory specimens during the acute phase of infection. Negative results do not preclude SARS-CoV-2 infection, do not rule out co-infections with other pathogens, and should not be used as the sole basis for treatment or other patient management decisions. Negative results must be combined with clinical observations, patient history, and epidemiological information. The expected result is Negative. Fact Sheet for Patients: SugarRoll.be Fact Sheet for Healthcare Providers: https://www.woods-mathews.com/ This test is not yet approved or cleared by the [place] FDA and  has been authorized for detection and/or diagnosis of SARS-CoV-2 by FDA under an Emergency Use Authorization (EUA). This EUA will remain  in effect (meaning this test can be used) for the duration of the COVID-19 declaration under Section 56 4(b)(1) of the Act, 21 U.S.C. section 360bbb-3(b)(1), unless the authorization is terminated or revoked sooner. Performed at [facility], [place] [address] [place], [place] [address]     Coagulation Studies: No results for input(s): LABPROT, INR in the last 72 hours.  Urinalysis:  Recent Labs  Lab 05/12/19 0319  COLORURINE AMBER*  LABSPEC 1.023  PHURINE 6.0  GLUCOSEU NEGATIVE  HGBUR NEGATIVE  BILIRUBINUR NEGATIVE  KETONESUR 5*  PROTEINUR  NEGATIVE  NITRITE NEGATIVE  LEUKOCYTESUR TRACE*    Lipid Panel:     Component Value Date/Time   CHOL 142 11/15/2016 0652   TRIG 232 (H) 11/15/2016 0652   HDL 31 (L) 11/15/2016 0652   CHOLHDL 4.6 11/15/2016 0652   VLDL 46 (H) 11/15/2016 0652   LDLCALC 65 11/15/2016 0652    HgbA1C:  Lab Results  Component Value Date   HGBA1C 5.4 11/15/2016    Urine Drug Screen:      Component Value Date/Time   LABOPIA NONE DETECTED 11/14/2016 2300   COCAINSCRNUR NONE DETECTED 11/14/2016 2300   LABBENZ NONE DETECTED 11/14/2016 2300   AMPHETMU NONE DETECTED 11/14/2016 2300   THCU NONE DETECTED 11/14/2016 2300   LABBARB NONE DETECTED 11/14/2016 2300    Alcohol Level: No results for input(s): ETH in the last 168 hours.  Other results: EKG: normal EKG, normal sinus rhythm, unchanged from previous tracings.  Imaging: CT ABDOMEN PELVIS WO CONTRAST  Result Date: 05/14/2019 CLINICAL DATA:  Gross hematuria. EXAM: CT  ABDOMEN AND PELVIS WITHOUT CONTRAST TECHNIQUE: Multidetector CT imaging of the abdomen and pelvis was performed following the standard protocol without IV contrast. COMPARISON:  None. FINDINGS: Lower chest: No acute abnormality. Hepatobiliary: No focal liver abnormality is seen. Status post cholecystectomy. No biliary dilatation. Pancreas: Unremarkable. No pancreatic ductal dilatation or surrounding inflammatory changes. Spleen: Normal in size without focal abnormality. Adrenals/Urinary Tract: Adrenal glands appear normal. Small nonobstructive left renal calculus is noted. No hydronephrosis or renal obstruction is noted. Urinary bladder is unremarkable. Stomach/Bowel: Stomach is within normal limits. Appendix appears normal. No evidence of bowel wall thickening, distention, or inflammatory changes. Vascular/Lymphatic: Aortic atherosclerosis. No enlarged abdominal or pelvic lymph nodes. Reproductive: Status post hysterectomy. No adnexal masses. Other: No abdominal wall hernia or abnormality. No  abdominopelvic ascites. Musculoskeletal: No acute or significant osseous findings. IMPRESSION: 1. Small nonobstructive left renal calculus. No hydronephrosis or renal obstruction is noted. Aortic Atherosclerosis (ICD10-I70.0). Electronically Signed   By: Marijo Conception M.D.   On: 05/14/2019 12:33   ECHOCARDIOGRAM COMPLETE  Result Date: 05/14/2019    ECHOCARDIOGRAM REPORT   Patient Name:   AGATA LUCENTE [facility] Date of Exam: 05/13/2019 Medical Rec #:  [id number]       Height:       64.0 in Accession #:    [id number]      Weight:       131.0 lb Date of Birth:  [birth year]       BSA:          1.634 m Patient Age:    72 years        BP:           108/93 mmHg Patient Gender: F               HR:           63 bpm. Exam Location:  [facility] Procedure: 2D Echo Indications:     Syncope  History:         Patient has prior history of Echocardiogram examinations. TIA;                  Risk Factors:Hypertension and CKD III.  Sonographer:     Raliegh Ip Thornton-Maynard Referring Phys:  [id number] Athena Masse Diagnosing Phys: Yolonda Kida MD  Sonographer Comments: Suboptimal parasternal window and suboptimal apical window. IMPRESSIONS  1. Left ventricular ejection fraction, by estimation, is 60 to 65%. The left ventricle has normal function. The left ventricle has no regional wall motion abnormalities. Left ventricular diastolic parameters are consistent with Grade I diastolic dysfunction (impaired relaxation).  2. Right ventricular systolic function is normal. The right ventricular size is moderately enlarged. Mildly increased right ventricular wall thickness. There is moderately elevated pulmonary artery systolic pressure.  3. The mitral valve is grossly normal. Trivial mitral valve regurgitation.  4. The aortic valve is grossly normal. Aortic valve regurgitation is not visualized. FINDINGS  Left Ventricle: Left ventricular ejection fraction, by estimation, is 60 to 65%. The left ventricle has normal function. The left ventricle has no  regional wall motion abnormalities. The left ventricular internal cavity size was normal in size. There is  no left ventricular hypertrophy. Left ventricular diastolic parameters are consistent with Grade I diastolic dysfunction (impaired relaxation). Right Ventricle: The right ventricular size is moderately enlarged. Mildly increased right ventricular wall thickness. Right ventricular systolic function is normal. There is moderately elevated pulmonary artery systolic pressure. The tricuspid regurgitant velocity is 3.01 m/s, and with an assumed right atrial  pressure of 10 mmHg, the estimated right ventricular systolic pressure is 22.9 mmHg. Left Atrium: Left atrial size was normal in size. Right Atrium: Right atrial size was normal in size. Pericardium: There is no evidence of pericardial effusion. Mitral Valve: The mitral valve is grossly normal. Trivial mitral valve regurgitation. Tricuspid Valve: The tricuspid valve is grossly normal. Tricuspid valve regurgitation is mild. Aortic Valve: The aortic valve is grossly normal. Aortic valve regurgitation is not visualized. Aortic valve mean gradient measures 3.0 mmHg. Aortic valve peak gradient measures 5.9 mmHg. Aortic valve area, by VTI measures 1.85 cm. Pulmonic Valve: The pulmonic valve was grossly normal. Pulmonic valve regurgitation is not visualized. Aorta: The aortic root is normal in size and structure. IAS/Shunts: No atrial level shunt detected by color flow Doppler.  LEFT VENTRICLE PLAX 2D LVIDd:         3.79 cm  Diastology LVIDs:         2.46 cm  LV e' lateral:   8.16 cm/s LV PW:         0.80 cm  LV E/e' lateral: 8.3 LV IVS:        0.71 cm  LV e' medial:    6.31 cm/s LVOT diam:     1.90 cm  LV E/e' medial:  10.7 LV SV:         49 LV SV Index:   30 LVOT Area:     2.84 cm  RIGHT VENTRICLE RV S prime:     13.50 cm/s TAPSE (M-mode): 1.7 cm LEFT ATRIUM             Index LA diam:        3.50 cm 2.14 cm/m LA Vol (A2C):   55.4 ml 33.90 ml/m LA Vol (A4C):   42.8  ml 26.19 ml/m LA Biplane Vol: 49.0 ml 29.98 ml/m  AORTIC VALVE AV Area (Vmax):    1.91 cm AV Area (Vmean):   1.91 cm AV Area (VTI):     1.85 cm AV Vmax:           121.00 cm/s AV Vmean:          81.600 cm/s AV VTI:            0.267 m AV Peak Grad:      5.9 mmHg AV Mean Grad:      3.0 mmHg LVOT Vmax:         81.30 cm/s LVOT Vmean:        55.000 cm/s LVOT VTI:          0.174 m LVOT/AV VTI ratio: 0.65  AORTA Ao Root diam: 3.50 cm MITRAL VALVE               TRICUSPID VALVE MV Area (PHT): 4.78 cm    TR Peak grad:   36.2 mmHg MV E velocity: 67.40 cm/s  TR Vmax:        301.00 cm/s MV A velocity: 87.30 cm/s MV E/A ratio:  0.77        SHUNTS                            Systemic VTI:  0.17 m                            Systemic Diam: 1.90 cm Dwayne Prince Rome MD Electronically signed by Yolonda Kida MD Signature Date/Time: 05/14/2019/1:30:38 PM  Final      Assessment/Plan:  79 y.o. femalewith medical history significant for hypertension, TIA, CKD 3, depression, restless legs and insomnia, with history of dyskinesia/parkinsonism, followed by neurologist, Dr. Manuella Ghazi on Sinemet, started on amantadine on 05/07/2019 who was brought into the emergency room after she had 3 falls in the last 48 hours.  Most of the history is taken from the ER records.  Apparently in spite of parkinsonism patient has not had any falls until being started on the amantadine.  She was supposedly supposed to slowly titrate the dose up but daughter started her on the 100 mg dose as initial one  - Symptoms are worsened by anxiety and delirium while in the hospital - appreciate psychiatric evaluation where patient was started on Klonopin and seroquel - Delirium improves with familiar surroundings and likely would be beneficial to d/c patient home with families assistance.   - She would follow up with Dr. Manuella Ghazi as out patient - Likely hold seroquel at d/c and leave her on home Klonopin.  - would not restarted amantadine until she is followed  by Dr. Manuella Ghazi Neurology as out patient.    05/15/2019, 10:39 AM

## 2019-05-15 NOTE — Progress Notes (Signed)
Physical Therapy Treatment Patient Details Name: Maria Horton MRN: [id number] DOB: 1940/03/09 Today's Date: 05/15/2019    History of Present Illness Pt. is a 79 y.o. female who was admitted to [facility] with recurrent falls, and Ataxia. PMHx includes: HTN, TIA, Insomnia, Parkinsonism, GERD, Fibromyalgia, Chronic Back Pain, Diverticulosis, Osteoporosis, Anxiety, and Depression.    PT Comments    Pt is making gradual progress towards goals with increased ability to work with therapy this date. Decreased tremors noted while in supine position, however increase in frequency with mobility efforts and while seated at EOB, still remains unable to maintain upright posture. Not safe at this time for OOB mobility due to ataxic movement and cognition status. Due to continuous movement of mouth, noted mouth/tongue are very dry, assisted with moisture sticks. Still confused thinking she is in [place], however very pleasant and willing to work with therapy. Strength remains intact and would expect quick progress once ataxic movements under better control to perform safe mobility.  Follow Up Recommendations  SNF     Equipment Recommendations  None recommended by PT    Recommendations for Other Services       Precautions / Restrictions Precautions Precautions: Fall Restrictions Weight Bearing Restrictions: No    Mobility  Bed Mobility Overal bed mobility: Needs Assistance Bed Mobility: Supine to Sit     Supine to sit: Mod assist     General bed mobility comments: follows commands, however still with high frequency tremors impairing balance while seated at EOB. Needs B UE support on bed to maintain balance along with mod assist from therapist. ONly able to maintain seated position for a few minutes prior to fatigue and need to transition back to bed.  Transfers                 General transfer comment: unsafe due to ataxia and tremors  Ambulation/Gait                 Stairs              Wheelchair Mobility    Modified Rankin (Stroke Patients Only)       Balance Overall balance assessment: History of Falls;Needs assistance Sitting-balance support: Feet supported;Bilateral upper extremity supported Sitting balance-Leahy Scale: Poor Sitting balance - Comments: post leaning with ataxic movement. needs constant assist to maintain upright posture                                    Cognition Arousal/Alertness: Awake/alert Behavior During Therapy: Restless Overall Cognitive Status: Impaired/Different from baseline                                 General Comments: reports still being in [place]. Does appear to recognize therapist      Exercises Other Exercises Other Exercises: supine ther-ex performed on B LE including AP, SLRs, hip abd/add, and heel slides. ALl ther-ex performed x 10 reps with min assist. Also performed B UE shoulder flexion x 10 reps. Smoother movement noted while in bed vs upright posture    General Comments        Pertinent Vitals/Pain Pain Assessment: No/denies pain    Home Living                      Prior Function  PT Goals (current goals can now be found in the care plan section) Acute Rehab PT Goals Patient Stated Goal: to feel safe getting out of bed PT Goal Formulation: With patient Time For Goal Achievement: 05/28/19 Potential to Achieve Goals: Good Progress towards PT goals: Progressing toward goals    Frequency    Min 2X/week      PT Plan Current plan remains appropriate    Co-evaluation              AM-PAC PT "6 Clicks" Mobility   Outcome Measure  Help needed turning from your back to your side while in a flat bed without using bedrails?: A Little Help needed moving from lying on your back to sitting on the side of a flat bed without using bedrails?: A Little Help needed moving to and from a bed to a chair (including a wheelchair)?:  Total Help needed standing up from a chair using your arms (e.g., wheelchair or bedside chair)?: Total Help needed to walk in hospital room?: Total Help needed climbing 3-5 steps with a railing? : Total 6 Click Score: 10    End of Session   Activity Tolerance: Patient tolerated treatment well Patient left: in bed;with bed alarm set Nurse Communication: Mobility status PT Visit Diagnosis: Unsteadiness on feet (R26.81);Repeated falls (R29.6);Muscle weakness (generalized) (M62.81);History of falling (Z91.81);Difficulty in walking, not elsewhere classified (R26.2);Apraxia (R48.2);Ataxic gait (R26.0)     Time: 0936-1000 PT Time Calculation (min) (ACUTE ONLY): 24 min  Charges:  $Therapeutic Exercise: 8-22 mins $Therapeutic Activity: 8-22 mins                     Elizabeth Palau, PT, DPT [phone]    Geraldene Eisel 05/15/2019, 11:51 AM

## 2019-05-15 NOTE — Progress Notes (Signed)
Civil engineer, contracting [facility]) Hospital Liaison RN Note  Notified by Tilden Fossa, NP with Palliative Medicine Team of family request for [facility] services at home after discharge. Chart and patient information under review by [facility] physician. Hospice eligibility has been confirmed by [facility] physician.  Spoke with daughter Debe Coder by phone to initiate education related to hospice philosophy, services and team approach to care. Luana verbalized understanding of information given. Per discussion, plan is for discharge home by EMS possibly Wednesday April 21st.  DME needs discussed and daughter requesting hospital bed with 1/2 rails, OBT, wheelchair, O2 @ 2lpm, suction. [facility] has been notified of equipment needs. Home address has been verified and is correct on the chart. Debe Coder is the contact to arrange time of equipment delivery at [phone]. She will let me know when delivery time is setup to assist with discharge planning.  Please send completed and signed DNR form home with the patient at the time of discharge.  Patient will need prescriptions for discharge comfort medications.  [facility] aware of the above. Above information shared with Larene Pickett, LCSW.  Please call with any hospice related questions or concerns.  Thank you, Haynes Bast, BSN, RN [facility] Liaison [phone]

## 2019-05-15 NOTE — Consult Note (Addendum)
Consultation Note Date: 05/15/2019   Patient Name: Maria Horton  DOB: [birth year]  MRN: [id number]  Age / Sex: 79 y.o., female  PCP: Baxter Hire, MD Referring Physician: Max Sane, MD  Reason for Consultation: Establishing goals of care  HPI/Patient Profile: 79 y.o. female  with past medical history of TIA, HTN, CKD 3, depression, anxiety, RLS, insomnia, dyskinesia/parkinsonism, fibromyalgia, and bipolar disorder admitted on 05/11/2019 with recurrent falls, increased tremors. She fell 3 times in 48 hours. She was started on amantadine on 4/12 and symptoms have increased since that time - increased falls thought to be d/t medication. Neurology and psychiatry have been involved. She is now on sinemet and amantadine is being held.  PMT consulted for [place].  Clinical Assessment and Goals of Care: I have reviewed medical records including EPIC notes, labs and imaging, received report from RN, assessed the patient and then met with patient and daughter to discuss diagnosis prognosis, GOC, EOL wishes, disposition and options.  I introduced Palliative Medicine as specialized medical care for people living with serious illness. It focuses on providing relief from the symptoms and stress of a serious illness. The goal is to improve quality of life for both the patient and the family.  Daughter has been living with patient and helping care for her. Daughter tells me prior to medication change patient was doing okay - she walked with walker to and from bedroom and living room. Daughter prepared all meals and took care of managing household. Decreased appetite - she has lost 30 pounds in the last year. Mind was sharp - no problems with hallucinations prior to med changes. She did have problems with tremors but symptoms are significantly increased now.    We discussed patient's current illness and what it means in the larger context of patient's on-going  co-morbidities. Daughter is concerned patient is nearing end of life d/t her rapid decline with no improvement despite medication changes. We discuss patient's decline in function, nutrition and cognition. Patient was actively hallucinating during my visit but would also have moments of clarity and share appropriate information. Daughter shares concern over worsened appetite - only eating a couple of bites at each meal - gets choked up very easily.   We reviewed PT eval - patient is essentially bedbound - too unstable to stand. Unable to maintain sitting position.  I attempted to elicit values and goals of care important to the patient.  What is most important to patient and daughter is for the patient to be at home. We did discuss option of SNF as it is recommended by PT - discussed risks and benefits. Daughter feels risks outweigh benefits. They are hopeful this will help with her delirium. We discuss different support at home: home health vs hospice. After discussion of each, daughter expresses interest in hospice care.   Daughter shares HCPOA documents (daughter is Economist) and living will. We discussed code status: DNR vs full code. DNR recommended and daughter agrees.   Questions and concerns were addressed.  The family was encouraged to call with questions or concerns.   Discussed above with social worker, Dr. Manuella Ghazi, and hospice liaison.  Primary Decision Maker HCPOA - daughter [name]    SUMMARY OF RECOMMENDATIONS   - code status changed to DNR - referral for home with hospice  -- Please continue current medication regimen at discharge per daughter's request - specifically parkinsons and psych meds  Symptom Management:   Back pain - chronic - k pad ordered  Dry mouth - biotene BID ordered  Daughter concerned about increasing anxiety but will defer med changes to psych/neuro  Code Status/Advance Care Planning:  DNR  Psycho-social/Spiritual:   Desire for further Chaplaincy  support:no  Additional Recommendations: Education on Hospice  Prognosis:   Poor prognosis r/t severe functional decline - now bedbound, weight loss of 30 pounds, poor appetite, high risk of aspiration, delirium/hallucinations  Discharge Planning: Home with Hospice      Primary Diagnoses: Present on Admission: . Hypertension . Falls   I have reviewed the medical record, interviewed the patient and family, and examined the patient. The following aspects are pertinent.  Past Medical History:  Diagnosis Date  . Anxiety   . Chronic back pain   . Constipation   . Depression   . Diverticulosis   . Fibromyalgia   . Gastroparesis   . GERD (gastroesophageal reflux disease)   . Hypertension   . Hypoglycemia   . Hypokalemia   . Osteoarthritis   . PONV (postoperative nausea and vomiting)   . Reflux   . Stroke ([place])   . TIA (transient ischemic attack)   . Vascular dementia ([place])   . Vitamin D deficiency    Social History   Socioeconomic History  . Marital status: Widowed    Spouse name: Not on file  . Number of children: Not on file  . Years of education: Not on file  . Highest education level: Not on file  Occupational History  . Not on file  Tobacco Use  . Smoking status: Never Smoker  . Smokeless tobacco: Never Used  Substance and Sexual Activity  . Alcohol use: No  . Drug use: No  . Sexual activity: Not on file  Other Topics Concern  . Not on file  Social History Narrative  . Not on file   Social Determinants of Health   Financial Resource Strain:   . Difficulty of Paying Living Expenses:   Food Insecurity:   . Worried About Charity fundraiser in the Last Year:   . Arboriculturist in the Last Year:   Transportation Needs:   . Film/video editor (Medical):   Marland Kitchen Lack of Transportation (Non-Medical):   Physical Activity:   . Days of Exercise per Week:   . Minutes of Exercise per Session:   Stress:   . Feeling of Stress :   Social Connections:   .  Frequency of Communication with Friends and Family:   . Frequency of Social Gatherings with Friends and Family:   . Attends Religious Services:   . Active Member of Clubs or Organizations:   . Attends Archivist Meetings:   Marland Kitchen Marital Status:    Family History  Problem Relation Age of Onset  . Hypertension Mother   . Breast cancer Sister 27   Scheduled Meds: . antiseptic oral rinse  15 mL Mouth Rinse BID  . aspirin EC  81 mg Oral Daily  . buPROPion  300 mg Oral Daily  . carbidopa-levodopa  1 tablet Oral QHS  . carbidopa-levodopa  1 tablet Oral TID  . citalopram  40 mg Oral Daily  . clonazePAM  0.5 mg Oral QHS  . clopidogrel  75 mg Oral Daily  . enoxaparin (LOVENOX) injection  40 mg Subcutaneous Q24H  . feeding supplement (ENSURE ENLIVE)  237 mL Oral BID BM  . lidocaine  1 patch Transdermal Q24H  . melatonin  10 mg Oral QHS  . metoprolol succinate  12.5 mg  Oral Daily  . mirabegron ER  50 mg Oral QHS  . multivitamin with minerals  1 tablet Oral Daily  . pantoprazole  40 mg Oral Daily  . QUEtiapine  25 mg Oral BID  . rOPINIRole  1 mg Oral QHS  . sodium chloride flush  3 mL Intravenous Q12H  . traZODone  100 mg Oral QHS   Continuous Infusions: PRN Meds:.acetaminophen **OR** acetaminophen, bisacodyl, ondansetron **OR** ondansetron (ZOFRAN) IV, sodium chloride Allergies  Allergen Reactions  . 5-Alpha Reductase Inhibitors   . Ace Inhibitors Other (See Comments)    achy  . Beta Adrenergic Blockers Other (See Comments)    Makes achy/feel sick  . Calcium Channel Blockers Other (See Comments)    Feels achy like sick  . Latex Rash  . Tape Rash   Review of Systems  Unable to perform ROS: Mental status change    Physical Exam Constitutional:      Comments: Severe tremors  Pulmonary:     Effort: Pulmonary effort is normal.  Skin:    General: Skin is warm and dry.  Neurological:     Mental Status: She is alert.     Comments: hallucinating     Vital Signs: BP  (!) 159/97 (BP Location: Left Arm)   Pulse 80   Temp 99.7 F (37.6 C) (Axillary)   Resp 20   Ht '5\' 4"'  (1.626 m)   Wt 61.2 kg   SpO2 95%   BMI 23.17 kg/m  Pain Scale: 0-10   Pain Score: 0-No pain   SpO2: SpO2: 95 % O2 Device:SpO2: 95 % O2 Flow Rate: .O2 Flow Rate (L/min): 2 L/min  IO: Intake/output summary:   Intake/Output Summary (Last 24 hours) at 05/15/2019 1355 Last data filed at 05/14/2019 1500 Gross per 24 hour  Intake 150.71 ml  Output --  Net 150.71 ml    LBM: Last BM Date: 05/10/19 Baseline Weight: Weight: 60.8 kg Most recent weight: Weight: 61.2 kg     Palliative Assessment/Data: PPS 20%    Time Total: 70 minutes Greater than 50%  of this time was spent counseling and coordinating care related to the above assessment and plan.  Juel Burrow, DNP, AGNP-C Palliative Medicine Team [phone] Pager: [phone]

## 2019-05-15 NOTE — Progress Notes (Signed)
1        [place] [facility] [place] NAME: Maria Horton    MR#:  [id number]  DATE OF BIRTH:  04-12-1940  SUBJECTIVE:  CHIEF COMPLAINT:   Chief Complaint  Patient presents with  . Fall  continues have her wild jerky movts and hallucinations. Daughter agreeable for hospice REVIEW OF SYSTEMS:  Review of Systems  Unable to perform ROS: Mental acuity   DRUG ALLERGIES:   Allergies  Allergen Reactions  . 5-Alpha Reductase Inhibitors   . Ace Inhibitors Other (See Comments)    achy  . Beta Adrenergic Blockers Other (See Comments)    Makes achy/feel sick  . Calcium Channel Blockers Other (See Comments)    Feels achy like sick  . Latex Rash  . Tape Rash   VITALS:  Blood pressure (!) 148/77, pulse 94, temperature 98.3 F (36.8 C), temperature source Oral, resp. rate 20, height 5\' 4"  (1.626 m), weight 61.2 kg, SpO2 98 %. PHYSICAL EXAMINATION:  Physical Exam Constitutional:      Appearance: She is cachectic.  HENT:     Head: Normocephalic and atraumatic.  Eyes:     Conjunctiva/sclera: Conjunctivae normal.     Pupils: Pupils are equal, round, and reactive to light.  Neck:     Thyroid: No thyromegaly.     Trachea: No tracheal deviation.  Cardiovascular:     Rate and Rhythm: Normal rate and regular rhythm.     Heart sounds: Normal heart sounds.  Pulmonary:     Effort: Pulmonary effort is normal. No respiratory distress.     Breath sounds: Normal breath sounds. No wheezing.  Chest:     Chest wall: No tenderness.  Abdominal:     General: Bowel sounds are normal. There is no distension.     Palpations: Abdomen is soft.     Tenderness: There is no abdominal tenderness.  Musculoskeletal:        General: Normal range of motion.     Cervical back: Normal range of motion and neck supple.  Skin:    General: Skin is warm and dry.     Findings: No rash.  Neurological:     Mental Status: She is oriented to person, place, and time.     Cranial Nerves: No cranial  nerve deficit.     Motor: Tremor present.     Comments: Tardive dyskinesia  Psychiatric:        Attention and Perception: She perceives visual hallucinations.        Behavior: Behavior is uncooperative.    LABORATORY PANEL:  Female CBC Recent Labs  Lab 05/14/19 0318  WBC 14.9*  HGB 12.1  HCT 35.6*  PLT 193   ------------------------------------------------------------------------------------------------------------------ Chemistries  Recent Labs  Lab 05/11/19 1752 05/12/19 0547 05/14/19 0318  NA 141   < > 143  K 3.5   < > 3.5  CL 100   < > 102  CO2 29   < > 29  GLUCOSE 67*   < > 79  BUN 18   < > 24*  CREATININE 1.20*   < > 0.82  CALCIUM 9.1   < > 10.1  AST 24  --   --   ALT 6  --   --   ALKPHOS 42  --   --   BILITOT 1.3*  --   --    < > = values in this interval not displayed.   RADIOLOGY:  No results found. ASSESSMENT AND PLAN:  Maria Horton is a 79 y.o. female with medical history significant for hypertension, TIA, CKD 3, depression, restless legs and insomnia, with history of dyskinesia/parkinsonism, followed by neurologist, Dr. Sherryll Burger on Sinemet, started on amantadine on 05/07/2019 who was brought into the emergency room after she had 3 falls in the last 48 hours  Recurrent falls -Likely from amantadine which is stopped  - CT Head shows no acute abnormalities -Appreciate neurology and psychiatry input.  -Continue home sinemet    Parkinsonism (HCC)   Medication adverse effect, initial encounter -Continue Sinemet.  Hold amantadine for now     Polypharmacy -Continue Klonopin, trazodone, Myrbetriq, melatonin Requip for now.  Stopped all other medications    Hypertension -Hold diuretic treatment as this could increase fall risk in the elderly -Continue metoprolol    Anxiety and depression -Continue Klonopin , trazodone.  Resume Celexa and Wellbutrin. Hold hydroxyzine for now Psychiatry input appreciated    History of TIA (transient ischemic  attack) -Continue aspirin, Plavix    Restless leg syndrome -Continue Requip    Hypoglycemia -Resolved.  She is eating and drinking.   Rhabdomyolysis Status post fall.  CK was 6604->4336.  Improving with IV hydration  Very poor prognosis. Palliative care meeting with daughter. Plan is home with Hospice which is very appropriate.  Status is: Inpatient  Remains inpatient appropriate because:Altered mental status   Dispo: The patient is from: Home              Anticipated d/c is to: Home with Hospice              Anticipated d/c date is: 1 day              Patient currently is not medically stable to d/c.     DVT prophylaxis: Lovenox Family Communication: Updated patient's daughter over phone on 4/19. PC had meeting with daughter on 4/20 so she is updated   All the records are reviewed and case discussed with Care Management/Social Worker. Management plans discussed with the patient, PC, Nursing and they are in agreement.  CODE STATUS: DNR  TOTAL TIME TAKING CARE OF THIS PATIENT: 35 minutes.   More than 50% of the time was spent in counseling/coordination of care: YES  POSSIBLE D/C IN 1 DAYS, DEPENDING ON CLINICAL CONDITION.   Maria Horton M.D on 05/15/2019 at 8:42 PM  [facility]   CC: Primary care physician; Gracelyn Nurse, MD  Note: This dictation was prepared with Dragon dictation along with smaller phrase technology. Any transcriptional errors that result from this process are unintentional.

## 2019-05-16 LAB — CK: Total CK: 643 U/L — ABNORMAL HIGH (ref 38–234)

## 2019-05-16 LAB — BASIC METABOLIC PANEL
Anion gap: 12 (ref 5–15)
BUN: 17 mg/dL (ref 8–23)
CO2: 26 mmol/L (ref 22–32)
Calcium: 9 mg/dL (ref 8.9–10.3)
Chloride: 107 mmol/L (ref 98–111)
Creatinine, Ser: 0.67 mg/dL (ref 0.44–1.00)
GFR calc Af Amer: >60 mL/min (ref >60)
GFR calc non Af Amer: >60 mL/min (ref >60)
Glucose, Bld: 77 mg/dL (ref 70–99)
Potassium: 3.5 mmol/L (ref 3.5–5.1)
Sodium: 145 mmol/L (ref 135–145)

## 2019-05-16 LAB — CBC
HCT: 32.8 % — ABNORMAL LOW (ref 36.0–46.0)
Hemoglobin: 10.7 g/dL — ABNORMAL LOW (ref 12.0–15.0)
MCH: 30.7 pg (ref 26.0–34.0)
MCHC: 32.6 g/dL (ref 30.0–36.0)
MCV: 94 fL (ref 80.0–100.0)
Platelets: 166 10*3/uL (ref 150–400)
RBC: 3.49 MIL/uL — ABNORMAL LOW (ref 3.87–5.11)
RDW: 14 % (ref 11.5–15.5)
WBC: 11.6 10*3/uL — ABNORMAL HIGH (ref 4.0–10.5)
nRBC: 0 % (ref 0.0–0.2)

## 2019-05-16 LAB — GLUCOSE, CAPILLARY: Glucose-Capillary: 80 mg/dL (ref 70–99)

## 2019-05-16 MED ORDER — BIOTENE DRY MOUTH MT LIQD: 15.0000 mL | Freq: Two times a day (BID) | OROMUCOSAL | 0 refills | Status: AC

## 2019-05-16 MED ORDER — CITALOPRAM HYDROBROMIDE 20 MG PO TABS
20.0000 mg | ORAL_TABLET | Freq: Every day | ORAL | Status: DC
  Administered 2019-05-16: 20 mg via ORAL
  Filled 2019-05-16: qty 1

## 2019-05-16 MED ORDER — ENSURE ENLIVE PO LIQD: 237.0000 mL | Freq: Two times a day (BID) | ORAL | 12 refills | Status: AC

## 2019-05-16 NOTE — Progress Notes (Signed)
pts dtr  Celso Amy notified and  Discharge instructions discueed with her re meds / diet activity and f/u.  Verbalizes understanding. Sl d/cd.  Dulcolax  Po given.

## 2019-05-16 NOTE — TOC Transition Note (Signed)
Transition of Care [facility]) - CM/SW Discharge Note   Patient Details  Name: Maria Horton MRN: [id number] Date of Birth: 1940-10-25  [facility] CM/SW Contact:  Lucy Chris, LCSW Phone Number: May 24, 2019, 12:57 PM   Clinical Narrative:   Pt accepted with hospice and equipment delivered to home this am. Daughter is ready for pt to come home. Bedside RN to call daughter and go over DC instructions. No further follow due to DC home with hospice-[facility]    Final next level of care: Home w Hospice Care Barriers to Discharge: Barriers Resolved   Patient Goals and CMS Choice Patient states their goals for this hospitalization and ongoing recovery are:: Daughter moved in with to assist with her care, was doing well at home prior to admission CMS Medicare.gov Compare Post Acute Care list provided to:: Patient Choice offered to / list presented to : Patient  Discharge Placement                Patient to be transferred to facility by: EMS Name of family member notified: Daughter Patient and family notified of of transfer: 2019-05-24  Discharge Plan and Services In-house Referral: Clinical Social Work   Post Acute Care Choice: Home Health, Skilled Nursing Facility          DME Arranged: Hospital bed, Community education officer wheelchair with seat cushion, Overbed table DME Agency: Other - Comment([facility])                  Social Determinants of Health (SDOH) Interventions     Readmission Risk Interventions No flowsheet data found.

## 2019-05-16 NOTE — Progress Notes (Signed)
Palliative:  Checked in with patient and daughter. Patient remains the same - lots of jerking; but pleasant overall. Still confused. Poor PO intake. Daughter is at home preparing for patient discharge today with hospice. No questions or concerns.  Will go home under hospice care today.  Gerlean Ren, DNP, AGNP-C Palliative Medicine Team Team Phone # [phone]  Pager # [phone]  NO CHARGE

## 2019-05-16 NOTE — Plan of Care (Signed)

## 2019-05-16 NOTE — Discharge Summary (Signed)
Physician Discharge Summary  CHETARA KROPP ZOX:[id number] DOB: 11-06-1940 DOA: 05/11/2019  PCP: Gracelyn Nurse, MD  Admit date: 05/11/2019 Discharge date: 06/14/19  Admitted From: Home Disposition: Home with hospice care  Recommendations for Outpatient Follow-up:  1. Follow up with PCP in 1-2 weeks 2. Please obtain BMP/CBC in one week 3. Please follow up on the following pending results: None  Home Health: Home hospice Equipment/Devices: Per hospice protocol Discharge Condition: Guarded  CODE STATUS: DNR Diet recommendation:  Regular / Dysphagia   Brief/Interim Summary: Vennessa Affinito McKernanis a 79 y.o.femalewith medical history significant forhypertension, TIA,CKD 3,depression, restless legs and insomnia, with history of dyskinesia/parkinsonism, followed by neurologist, Dr. Albertina Parr Sinemet, started on amantadine on 05/07/2019 who was brought into the emergency room after she had 3 falls in 48 hours.    Patient was recently started on amantadine which was discontinued.  Neurology was consulted and they advised to follow-up with Dr. Sherryll Burger for further recommendations.  Patient continued to have dystonic movements and hallucinations.  She was on multiple psych meds.  Psych was consulted and they started her on  Seroquel for some sundowning effect in the hospital which was not continued on discharge.  We discontinued her Vesicare as patient was on Myrbetriq.  We also discontinue hydroxyzine as she is also taking trazodone for sleep.  She needs to follow-up with primary care physician for further recommendations and med rec.  Patient with advanced parkinsonism with declining health over the past year.  She has lost more than 30 pounds.  Worsening dyskinesia.  Poor appetite and choking while eating.  Palliative care was consulted and after discussing with daughter they decided to go back home with hospice help.  They will follow-up with their neurologist as an outpatient.  Patient also had  rhabdomyolysis with elevated CK on presentation secondary to fall which improved with IV hydration.  Patient is very high risk for deterioration and death.  Discharge Diagnoses:  Principal Problem:   Recurrent falls Active Problems:   Hypertension   Parkinsonism (HCC)   Anxiety and depression   History of TIA (transient ischemic attack)   Restless leg syndrome   Medication adverse effect, initial encounter   Polypharmacy   Falls   Hypoglycemia   Goals of care, counseling/discussion   Palliative care encounter   DNR (do not resuscitate)   Discharge Instructions  Discharge Instructions    Diet - low sodium heart healthy   Complete by: As directed    Discharge instructions   Complete by: As directed    It was pleasure taking care of you. Please stop taking amantadine and follow-up with your neurologist for further recommendations. Please follow-up with your primary care physician to evaluate your medications as you are on multiple medications which can increase the risk of fall.  Discontinued hydroxyzine and your water pill.  Your primary care physician should be able to reevaluate you for continuation of these medications.  I also discontinue Vesicare as you were on Myrbetriq.,  Please take just one out of these.   Increase activity slowly   Complete by: As directed      Allergies as of 14-Jun-2019      Reactions   5-alpha Reductase Inhibitors    Ace Inhibitors Other (See Comments)   achy   Beta Adrenergic Blockers Other (See Comments)   Makes achy/feel sick   Calcium Channel Blockers Other (See Comments)   Feels achy like sick   Latex Rash   Tape Rash  Medication List    STOP taking these medications   famotidine 40 MG tablet Commonly known as: PEPCID   hydrOXYzine 50 MG tablet Commonly known as: ATARAX/VISTARIL   polyethylene glycol 17 g packet Commonly known as: MIRALAX / GLYCOLAX   solifenacin 5 MG tablet Commonly known as: VESICARE     TAKE these  medications   albuterol 1.25 MG/3ML nebulizer solution Commonly known as: ACCUNEB Take 3 mLs by nebulization every 6 (six) hours as needed for wheezing.   antiseptic oral rinse Liqd 15 mLs by Mouth Rinse route 2 (two) times daily.   aspirin 81 MG EC tablet Take 1 tablet (81 mg total) by mouth daily.   b complex vitamins tablet Take 1 tablet daily by mouth.   buPROPion 300 MG 24 hr tablet Commonly known as: WELLBUTRIN XL Take 300 mg by mouth daily.   carbidopa-levodopa 50-200 MG tablet Commonly known as: SINEMET CR Take 1 tablet by mouth at bedtime.   carbidopa-levodopa 25-100 MG tablet Commonly known as: SINEMET IR Take 1 tablet by mouth 3 (three) times daily.   cholecalciferol 25 MCG (1000 UNIT) tablet Commonly known as: VITAMIN D3 Take 1,000 Units by mouth daily.   citalopram 40 MG tablet Commonly known as: CELEXA Take 40 mg daily by mouth.   clonazePAM 0.5 MG tablet Commonly known as: KLONOPIN Take 1 tablet (0.5 mg total) by mouth at bedtime.   clopidogrel 75 MG tablet Commonly known as: PLAVIX Take 75 mg by mouth daily.   esomeprazole 40 MG capsule Commonly known as: NEXIUM Take 40 mg by mouth 2 (two) times daily.   feeding supplement (ENSURE ENLIVE) Liqd Take 237 mLs by mouth 2 (two) times daily between meals.   lidocaine 5 % Commonly known as: Lidoderm Place 1 patch onto the skin every 12 (twelve) hours. Remove & Discard patch within 12 hours or as directed by MD   megestrol 40 MG tablet Commonly known as: MEGACE Take 40 mg by mouth daily.   Melatonin 5 MG Chew Chew 10 mg by mouth at bedtime.   metoprolol succinate 25 MG 24 hr tablet Commonly known as: TOPROL-XL Take 0.5 tablets (12.5 mg total) by mouth daily.   mirabegron ER 50 MG Tb24 tablet Commonly known as: MYRBETRIQ Take 1 tablet (50 mg total) by mouth daily. What changed: when to take this   pravastatin 40 MG tablet Commonly known as: PRAVACHOL Take 1 tablet (40 mg total) by mouth  every evening.   rOPINIRole 0.5 MG tablet Commonly known as: REQUIP Take 0.5-1 mg by mouth 3 (three) times daily. 0.5MG - BID 1MG -QHS   traZODone 100 MG tablet Commonly known as: DESYREL Take 100 mg by mouth at bedtime. 100 mg at bedtime   triamterene-hydrochlorothiazide 37.5-25 MG tablet Commonly known as: MAXZIDE-25 Take 0.5 tablets by mouth daily.   vitamin B-12 1000 MCG tablet Commonly known as: CYANOCOBALAMIN Take 1,000 mcg by mouth daily.      Follow-up Information    Schedule an appointment as soon as possible for a visit  with 11-01-1991, MD.   Specialty: Internal Medicine Why: As needed Contact information: [address] [place] [phone]          Allergies  Allergen Reactions  . 5-Alpha Reductase Inhibitors   . Ace Inhibitors Other (See Comments)    achy  . Beta Adrenergic Blockers Other (See Comments)    Makes achy/feel sick  . Calcium Channel Blockers Other (See Comments)    Feels achy  like sick  . Latex Rash  . Tape Rash    Consultations:  Neurology  Psychiatry  Palliative care  Procedures/Studies: CT ABDOMEN PELVIS WO CONTRAST  Result Date: 05/14/2019 CLINICAL DATA:  Gross hematuria. EXAM: CT ABDOMEN AND PELVIS WITHOUT CONTRAST TECHNIQUE: Multidetector CT imaging of the abdomen and pelvis was performed following the standard protocol without IV contrast. COMPARISON:  None. FINDINGS: Lower chest: No acute abnormality. Hepatobiliary: No focal liver abnormality is seen. Status post cholecystectomy. No biliary dilatation. Pancreas: Unremarkable. No pancreatic ductal dilatation or surrounding inflammatory changes. Spleen: Normal in size without focal abnormality. Adrenals/Urinary Tract: Adrenal glands appear normal. Small nonobstructive left renal calculus is noted. No hydronephrosis or renal obstruction is noted. Urinary bladder is unremarkable. Stomach/Bowel: Stomach is within normal limits. Appendix appears normal. No  evidence of bowel wall thickening, distention, or inflammatory changes. Vascular/Lymphatic: Aortic atherosclerosis. No enlarged abdominal or pelvic lymph nodes. Reproductive: Status post hysterectomy. No adnexal masses. Other: No abdominal wall hernia or abnormality. No abdominopelvic ascites. Musculoskeletal: No acute or significant osseous findings. IMPRESSION: 1. Small nonobstructive left renal calculus. No hydronephrosis or renal obstruction is noted. Aortic Atherosclerosis (ICD10-I70.0). Electronically Signed   By: Lupita RaiderJames  Green Jr M.D.   On: 05/14/2019 12:33   DG Chest 2 View  Result Date: 05/11/2019 CLINICAL DATA:  Multiple falls including 2 yesterday. EXAM: CHEST - 2 VIEW COMPARISON:  11/04/2018 FINDINGS: Normal sized heart. Clear lungs with normal vascularity. Left subclavian bipolar pacemaker leads in satisfactory position. Thoracic spine degenerative changes. No fracture or pneumothorax seen. IMPRESSION: No acute abnormality. Electronically Signed   By: Beckie SaltsSteven  Reid M.D.   On: 05/11/2019 17:50   DG Cervical Spine 2-3 Views  Result Date: 05/11/2019 CLINICAL DATA:  Neck pain following a fall last night. EXAM: CERVICAL SPINE - 2-3 VIEW COMPARISON:  Cervical spine CT dated 10/31/2018. Single-view chest dated 11/04/2018. FINDINGS: Again demonstrated is interbody and anterior screw and plate fusion at the C5-6 level. Large anterior spurs are again demonstrated at the C3-4 and C4-5 levels with moderate anterior spur formation at the C6-7 level. Mild anterior spur formation at the C3-4 level. Multilevel facet degenerative changes. No prevertebral soft tissue swelling, fracture or subluxation seen. Left subclavian bipolar pacemaker. IMPRESSION: 1. No fracture or subluxation. 2. Stable postoperative and degenerative changes, as described above. Electronically Signed   By: Beckie SaltsSteven  Reid M.D.   On: 05/11/2019 16:01   DG Lumbar Spine 2-3 Views  Result Date: 05/11/2019 CLINICAL DATA:  Lumbosacral back pain  secondary to fall. Fall last night striking night stand. EXAM: LUMBAR SPINE - 2-3 VIEW COMPARISON:  Lumbar spine CT 10/31/2018 FINDINGS: The alignment is maintained. Vertebral body heights are normal. There is no listhesis. The posterior elements are intact. Endplate spurring at multiple levels with preservation of disc spaces. There is prominent facet hypertrophy at multiple levels. No fracture. Sacroiliac joints are congruent. IMPRESSION: No fracture or subluxation of the lumbar spine. Multilevel spondylosis. Electronically Signed   By: Narda RutherfordMelanie  Sanford M.D.   On: 05/11/2019 15:36   DG Wrist Complete Right  Result Date: 05/11/2019 CLINICAL DATA:  Fall, pain EXAM: RIGHT WRIST - COMPLETE 3+ VIEW COMPARISON:  None. FINDINGS: There is no evidence of fracture or dislocation. There is no evidence of arthropathy or other focal bone abnormality. Soft tissues are unremarkable. IMPRESSION: No fracture or dislocation of the right wrist. Electronically Signed   By: Lauralyn PrimesAlex  Bibbey M.D.   On: 05/11/2019 15:45   DG Ankle Complete Right  Result Date: 05/11/2019 CLINICAL DATA:  Right ankle pain after fall. Fall last night striking night stand. EXAM: RIGHT ANKLE - COMPLETE 3+ VIEW COMPARISON:  None. FINDINGS: There is no evidence of fracture, dislocation, or joint effusion. The ankle mortise is preserved with mild degenerative joint space narrowing. There is an Achilles tendon enthesophyte. Soft tissues are unremarkable. IMPRESSION: No fracture or subluxation of the right ankle. Electronically Signed   By: Narda Rutherford M.D.   On: 05/11/2019 15:34   CT Head Wo Contrast  Result Date: 05/11/2019 CLINICAL DATA:  Fall EXAM: CT HEAD WITHOUT CONTRAST TECHNIQUE: Contiguous axial images were obtained from the base of the skull through the vertex without intravenous contrast. COMPARISON:  CT head 10/31/2018 FINDINGS: Brain: No evidence of acute infarction, hemorrhage, hydrocephalus, extra-axial collection or mass lesion/mass  effect. Symmetric prominence of the ventricles, cisterns and sulci compatible with parenchymal volume loss. Patchy areas of white matter hypoattenuation are most compatible with chronic microvascular angiopathy. Vascular: Atherosclerotic calcification of the carotid siphons. No hyperdense vessel. Skull: No calvarial fracture or suspicious osseous lesion. No scalp swelling or hematoma. Sinuses/Orbits: Paranasal sinuses and mastoid air cells are predominantly clear. Orbital structures are unremarkable aside from prior lens extractions. Other: None IMPRESSION: 1. No acute intracranial abnormality. No scalp swelling or calvarial fracture. 2. Chronic microvascular angiopathy and parenchymal volume loss. Electronically Signed   By: Kreg Shropshire M.D.   On: 05/11/2019 19:09   ECHOCARDIOGRAM COMPLETE  Result Date: 05/14/2019    ECHOCARDIOGRAM REPORT   Patient Name:   RAINA SOLE [facility] Date of Exam: 05/13/2019 Medical Rec #:  [id number]       Height:       64.0 in Accession #:    [id number]      Weight:       131.0 lb Date of Birth:  1940-12-29       BSA:          1.634 m Patient Age:    78 years        BP:           108/93 mmHg Patient Gender: F               HR:           63 bpm. Exam Location:  [facility] Procedure: 2D Echo Indications:     Syncope  History:         Patient has prior history of Echocardiogram examinations. TIA;                  Risk Factors:Hypertension and CKD III.  Sonographer:     Kirtland Bouchard Thornton-Maynard Referring Phys:  [id number] Andris Baumann Diagnosing Phys: Alwyn Pea MD  Sonographer Comments: Suboptimal parasternal window and suboptimal apical window. IMPRESSIONS  1. Left ventricular ejection fraction, by estimation, is 60 to 65%. The left ventricle has normal function. The left ventricle has no regional wall motion abnormalities. Left ventricular diastolic parameters are consistent with Grade I diastolic dysfunction (impaired relaxation).  2. Right ventricular systolic function is normal. The right  ventricular size is moderately enlarged. Mildly increased right ventricular wall thickness. There is moderately elevated pulmonary artery systolic pressure.  3. The mitral valve is grossly normal. Trivial mitral valve regurgitation.  4. The aortic valve is grossly normal. Aortic valve regurgitation is not visualized. FINDINGS  Left Ventricle: Left ventricular ejection fraction, by estimation, is 60 to 65%. The left ventricle has normal function. The left ventricle has no regional wall motion abnormalities. The left ventricular internal cavity size  was normal in size. There is  no left ventricular hypertrophy. Left ventricular diastolic parameters are consistent with Grade I diastolic dysfunction (impaired relaxation). Right Ventricle: The right ventricular size is moderately enlarged. Mildly increased right ventricular wall thickness. Right ventricular systolic function is normal. There is moderately elevated pulmonary artery systolic pressure. The tricuspid regurgitant velocity is 3.01 m/s, and with an assumed right atrial pressure of 10 mmHg, the estimated right ventricular systolic pressure is 09.3 mmHg. Left Atrium: Left atrial size was normal in size. Right Atrium: Right atrial size was normal in size. Pericardium: There is no evidence of pericardial effusion. Mitral Valve: The mitral valve is grossly normal. Trivial mitral valve regurgitation. Tricuspid Valve: The tricuspid valve is grossly normal. Tricuspid valve regurgitation is mild. Aortic Valve: The aortic valve is grossly normal. Aortic valve regurgitation is not visualized. Aortic valve mean gradient measures 3.0 mmHg. Aortic valve peak gradient measures 5.9 mmHg. Aortic valve area, by VTI measures 1.85 cm. Pulmonic Valve: The pulmonic valve was grossly normal. Pulmonic valve regurgitation is not visualized. Aorta: The aortic root is normal in size and structure. IAS/Shunts: No atrial level shunt detected by color flow Doppler.  LEFT VENTRICLE PLAX 2D  LVIDd:         3.79 cm  Diastology LVIDs:         2.46 cm  LV e' lateral:   8.16 cm/s LV PW:         0.80 cm  LV E/e' lateral: 8.3 LV IVS:        0.71 cm  LV e' medial:    6.31 cm/s LVOT diam:     1.90 cm  LV E/e' medial:  10.7 LV SV:         49 LV SV Index:   30 LVOT Area:     2.84 cm  RIGHT VENTRICLE RV S prime:     13.50 cm/s TAPSE (M-mode): 1.7 cm LEFT ATRIUM             Index LA diam:        3.50 cm 2.14 cm/m LA Vol (A2C):   55.4 ml 33.90 ml/m LA Vol (A4C):   42.8 ml 26.19 ml/m LA Biplane Vol: 49.0 ml 29.98 ml/m  AORTIC VALVE AV Area (Vmax):    1.91 cm AV Area (Vmean):   1.91 cm AV Area (VTI):     1.85 cm AV Vmax:           121.00 cm/s AV Vmean:          81.600 cm/s AV VTI:            0.267 m AV Peak Grad:      5.9 mmHg AV Mean Grad:      3.0 mmHg LVOT Vmax:         81.30 cm/s LVOT Vmean:        55.000 cm/s LVOT VTI:          0.174 m LVOT/AV VTI ratio: 0.65  AORTA Ao Root diam: 3.50 cm MITRAL VALVE               TRICUSPID VALVE MV Area (PHT): 4.78 cm    TR Peak grad:   36.2 mmHg MV E velocity: 67.40 cm/s  TR Vmax:        301.00 cm/s MV A velocity: 87.30 cm/s MV E/A ratio:  0.77        SHUNTS  Systemic VTI:  0.17 m                            Systemic Diam: 1.90 cm Alwyn Pea MD Electronically signed by Alwyn Pea MD Signature Date/Time: 05/14/2019/1:30:38 PM    Final      Subjective: Patient was aphasic with irregular jerky movements involving all limbs.  Discharge Exam: Vitals:   29-May-2019 0807 29-May-2019 1150  BP: (!) 145/84 115/85  Pulse: 91 92  Resp: 17 17  Temp: 98.3 F (36.8 C) 97.9 F (36.6 C)  SpO2: 94% 92%   Vitals:   05/15/19 2359 2019/05/29 0427 May 29, 2019 0807 2019-05-29 1150  BP: (!) 150/78 (!) 147/80 (!) 145/84 115/85  Pulse: 78 75 91 92  Resp: 16 16 17 17   Temp: 98.1 F (36.7 C) 98.3 F (36.8 C) 98.3 F (36.8 C) 97.9 F (36.6 C)  TempSrc: Oral Oral Oral Oral  SpO2: 99% 97% 94% 92%  Weight:      Height:        General:  Chronically ill-appearing lady, having irregular jerky movements involving all limbs.  Aphasic, not following any commands. Cardiovascular: RRR, S1/S2 +, no rubs, no gallops Respiratory: CTA bilaterally, no wheezing, no rhonchi Abdominal: Soft, NT, ND, bowel sounds + Extremities: no edema, no cyanosis   The results of significant diagnostics from this hospitalization (including imaging, microbiology, ancillary and laboratory) are listed below for reference.    Microbiology: Recent Results (from the past 240 hour(s))  SARS CORONAVIRUS 2 (TAT 6-24 HRS) Nasopharyngeal Nasopharyngeal Swab     Status: None   Collection Time: 05/11/19  9:02 PM   Specimen: Nasopharyngeal Swab  Result Value Ref Range Status   SARS Coronavirus 2 NEGATIVE NEGATIVE Final    Comment: (NOTE) SARS-CoV-2 target nucleic acids are NOT DETECTED. The SARS-CoV-2 RNA is generally detectable in upper and lower respiratory specimens during the acute phase of infection. Negative results do not preclude SARS-CoV-2 infection, do not rule out co-infections with other pathogens, and should not be used as the sole basis for treatment or other patient management decisions. Negative results must be combined with clinical observations, patient history, and epidemiological information. The expected result is Negative. Fact Sheet for Patients: 05/13/19 Fact Sheet for Healthcare Providers: HairSlick.no This test is not yet approved or cleared by the [place] FDA and  has been authorized for detection and/or diagnosis of SARS-CoV-2 by FDA under an Emergency Use Authorization (EUA). This EUA will remain  in effect (meaning this test can be used) for the duration of the COVID-19 declaration under Section 56 4(b)(1) of the Act, 21 U.S.C. section 360bbb-3(b)(1), unless the authorization is terminated or revoked sooner. Performed at [facility], [address] [place], [place]      Labs: BNP (last 3 results) No results for input(s): BNP in the last 8760 hours. Basic Metabolic Panel: Recent Labs  Lab 05/11/19 1752 05/12/19 0547 05/14/19 0318 2019/05/29 0405  NA 141 142 143 145  K 3.5 3.5 3.5 3.5  CL 100 102 102 107  CO2 29 28 29 26   GLUCOSE 67* 76 79 77  BUN 18 21 24* 17  CREATININE 1.20* 1.19* 0.82 0.67  CALCIUM 9.1 9.3 10.1 9.0   Liver Function Tests: Recent Labs  Lab 05/11/19 1752  AST 24  ALT 6  ALKPHOS 42  BILITOT 1.3*  PROT 6.3*  ALBUMIN 3.8   No results for input(s):  LIPASE, AMYLASE in the last 168 hours. No results for input(s): AMMONIA in the last 168 hours. CBC: Recent Labs  Lab 05/11/19 1752 05/12/19 0547 05/14/19 0318 Jun 03, 2019 0405  WBC 9.3 9.8 14.9* 11.6*  NEUTROABS 6.3  --   --   --   HGB 12.4 12.2 12.1 10.7*  HCT 36.2 35.2* 35.6* 32.8*  MCV 91.2 90.7 91.0 94.0  PLT 210 204 193 166   Cardiac Enzymes: Recent Labs  Lab 05/13/19 1413 05/14/19 0318 03-Jun-2019 0405  CKTOTAL 6,604* 4,336* 643*   BNP: Invalid input(s): POCBNP CBG: Recent Labs  Lab 05/12/19 0602 05/13/19 0545 05/14/19 1123 05/15/19 0507 06/03/2019 0431  GLUCAP 82 77 75 80 80   D-Dimer No results for input(s): DDIMER in the last 72 hours. Hgb A1c No results for input(s): HGBA1C in the last 72 hours. Lipid Profile No results for input(s): CHOL, HDL, LDLCALC, TRIG, CHOLHDL, LDLDIRECT in the last 72 hours. Thyroid function studies No results for input(s): TSH, T4TOTAL, T3FREE, THYROIDAB in the last 72 hours.  Invalid input(s): FREET3 Anemia work up No results for input(s): VITAMINB12, FOLATE, FERRITIN, TIBC, IRON, RETICCTPCT in the last 72 hours. Urinalysis    Component Value Date/Time   COLORURINE AMBER (A) 05/12/2019 0319   APPEARANCEUR CLOUDY (A) 05/12/2019 0319   APPEARANCEUR Cloudy (A) 10/30/2018 1342   LABSPEC 1.023 05/12/2019 0319   PHURINE 6.0 05/12/2019 0319   GLUCOSEU NEGATIVE 05/12/2019 0319   HGBUR  NEGATIVE 05/12/2019 0319   BILIRUBINUR NEGATIVE 05/12/2019 0319   BILIRUBINUR Negative 10/30/2018 1342   KETONESUR 5 (A) 05/12/2019 0319   PROTEINUR NEGATIVE 05/12/2019 0319   NITRITE NEGATIVE 05/12/2019 0319   LEUKOCYTESUR TRACE (A) 05/12/2019 0319   Sepsis Labs Invalid input(s): PROCALCITONIN,  WBC,  LACTICIDVEN Microbiology Recent Results (from the past 240 hour(s))  SARS CORONAVIRUS 2 (TAT 6-24 HRS) Nasopharyngeal Nasopharyngeal Swab     Status: None   Collection Time: 05/11/19  9:02 PM   Specimen: Nasopharyngeal Swab  Result Value Ref Range Status   SARS Coronavirus 2 NEGATIVE NEGATIVE Final    Comment: (NOTE) SARS-CoV-2 target nucleic acids are NOT DETECTED. The SARS-CoV-2 RNA is generally detectable in upper and lower respiratory specimens during the acute phase of infection. Negative results do not preclude SARS-CoV-2 infection, do not rule out co-infections with other pathogens, and should not be used as the sole basis for treatment or other patient management decisions. Negative results must be combined with clinical observations, patient history, and epidemiological information. The expected result is Negative. Fact Sheet for Patients: HairSlick.no Fact Sheet for Healthcare Providers: quierodirigir.com This test is not yet approved or cleared by the [place] FDA and  has been authorized for detection and/or diagnosis of SARS-CoV-2 by FDA under an Emergency Use Authorization (EUA). This EUA will remain  in effect (meaning this test can be used) for the duration of the COVID-19 declaration under Section 56 4(b)(1) of the Act, 21 U.S.C. section 360bbb-3(b)(1), unless the authorization is terminated or revoked sooner. Performed at [facility], [address], [place], [place] [address]     Time coordinating discharge: Over 30 minutes  SIGNED:  Arnetha Courser, MD  [facility] 03-Jun-2019, 12:04  PM  If 7PM-7AM, please contact night-coverage [email or web address]  This record has been created using Conservation officer, historic buildings. Errors have been sought and corrected,but may not always be located. Such creation errors do not reflect on the standard of care.

## 2019-05-16 NOTE — Progress Notes (Signed)
Ems picked pt up at this time for transport  Home. pts dtr notified  That pt was enroute

## 2019-05-26 DEATH — deceased

## 2020-10-13 IMAGING — RF DG SNIFF TEST
3 series · 5 of 5 positions shown · non-contrast
Comparison: Chest x-ray of October 08, 2017

CLINICAL DATA: Chronic exertional shortness of breath. Known
paralyzed hemidiaphragm.

EXAM:
CHEST FLUOROSCOPY
TECHNIQUE: Real-time fluoroscopic evaluation of the chest was performed.
FLUOROSCOPY TIME:  Fluoroscopy Time:  0 minutes, 30 seconds
Radiation Exposure Index (if provided by the fluoroscopic device):
15.1 mGy
Number of Acquired Spot Images: 3

[Series 1: fluoro_chest_bw · 0.18mm/px · 3 of 11 frames shown (1 of 3)]
[frame 2/11]
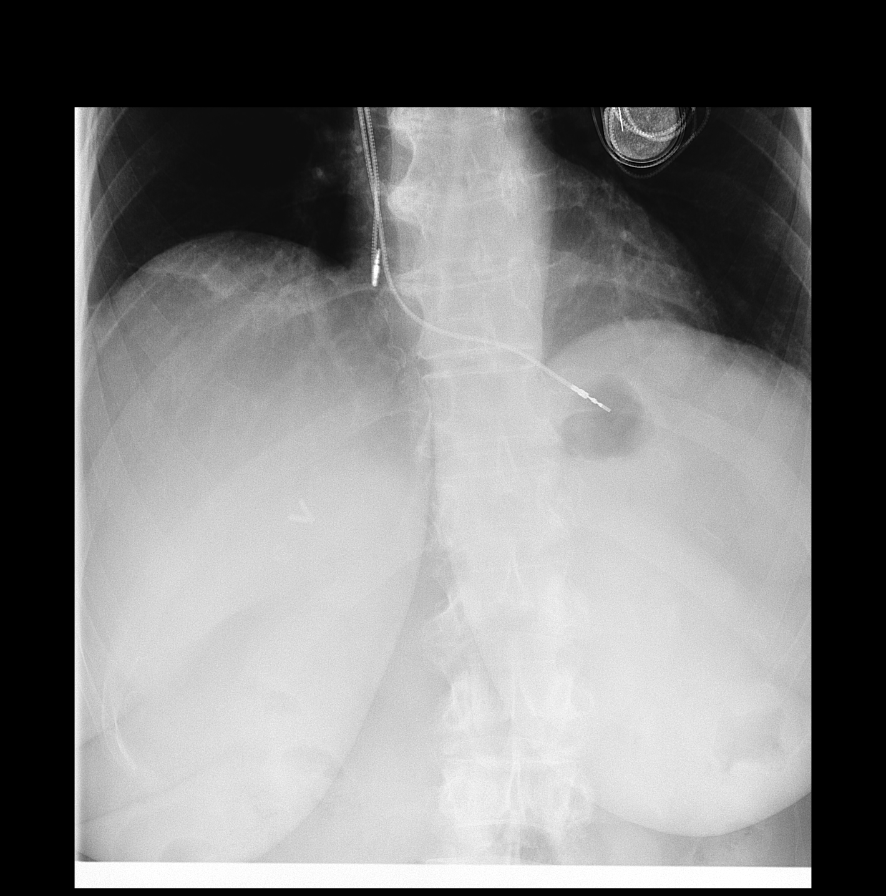
[frame 6/11]
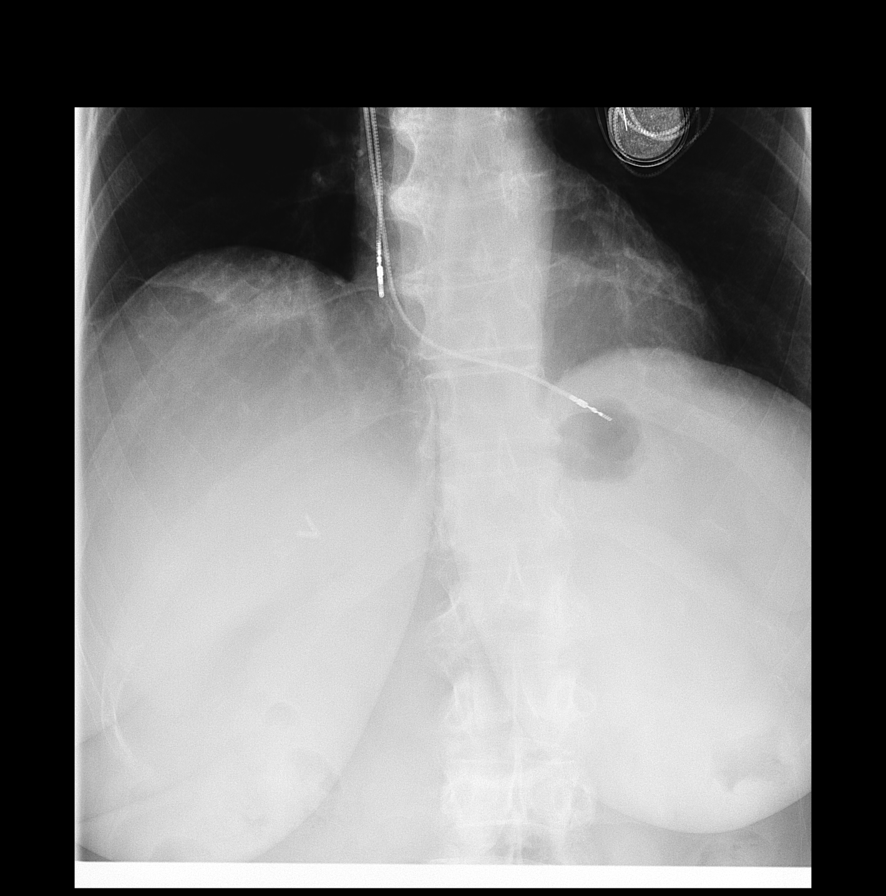
[frame 10/11]
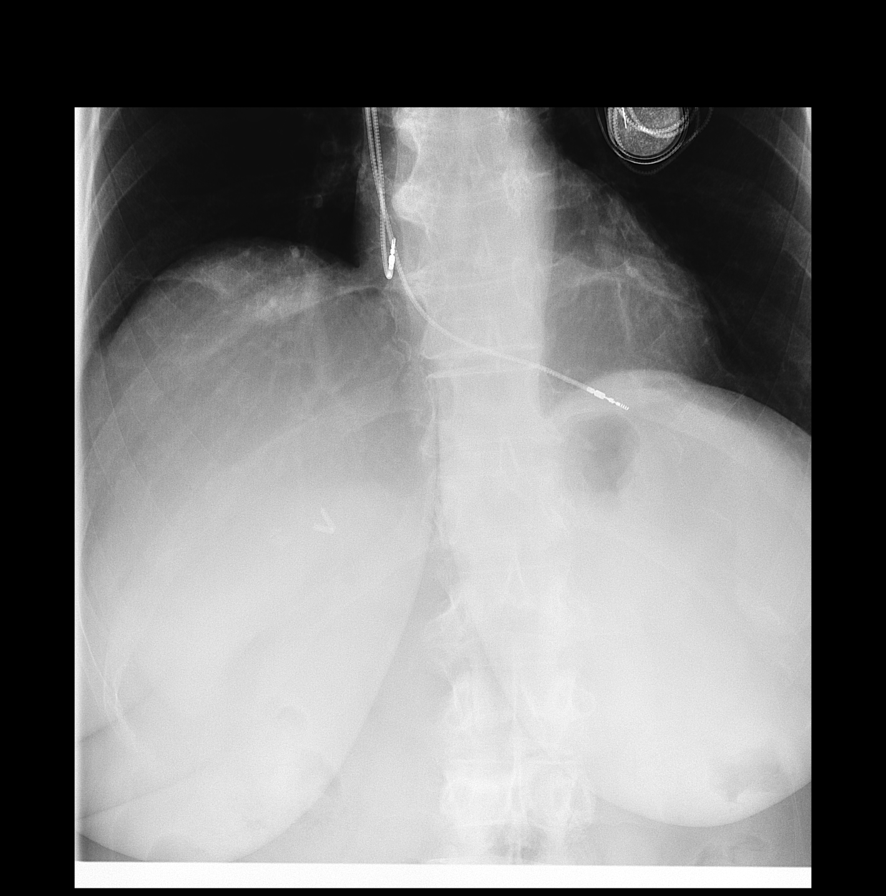

[Series 2: fluoro_chest_bw · 0.18mm/px · 1 of 1 slices shown (2 of 3)]
[im 1/1]
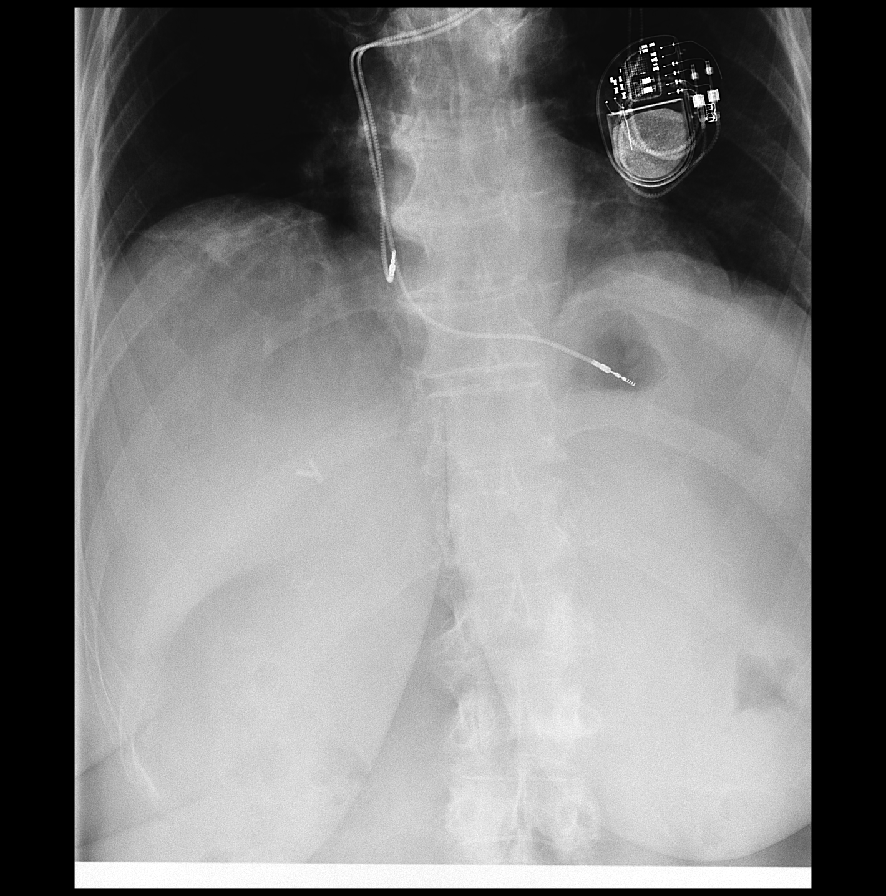

[Series 3: fluoro_chest_bw · 0.18mm/px · 1 of 1 slices shown (3 of 3)]
[im 1/1]
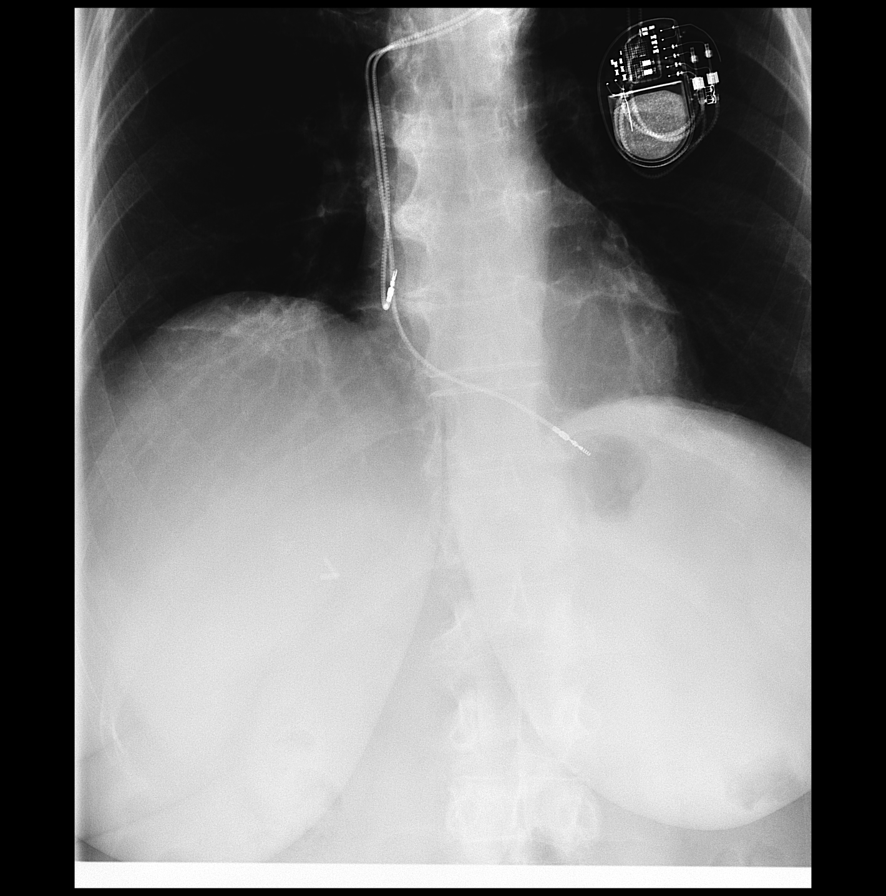

[5 of 5 positions shown; findings below may reference images not displayed]

FINDINGS: The right hemidiaphragm at rest is higher than the left. With the
sniff maneuver the left hemidiaphragm moves inferiorly in a normal
fashion. The right hemidiaphragm exhibits minimal paradoxical upward
motion. With coughing similar findings are noted.
IMPRESSION: Findings consistent with paralysis of the right hemidiaphragm.
